# Patient Record
Sex: Female | Born: 1970 | Hispanic: Yes | Marital: Married | Smoking: Current some day smoker
Health system: Southern US, Community
[De-identification: ages and names within clinical notes are randomized; demographics above are authoritative.]

## PROBLEM LIST (undated history)

## (undated) ENCOUNTER — Inpatient Hospital Stay (HOSPITAL_COMMUNITY): Payer: Self-pay

## (undated) DIAGNOSIS — F32A Depression, unspecified: Secondary | ICD-10-CM

## (undated) DIAGNOSIS — K469 Unspecified abdominal hernia without obstruction or gangrene: Secondary | ICD-10-CM

## (undated) DIAGNOSIS — M1990 Unspecified osteoarthritis, unspecified site: Secondary | ICD-10-CM

## (undated) DIAGNOSIS — Z5189 Encounter for other specified aftercare: Secondary | ICD-10-CM

## (undated) DIAGNOSIS — N83209 Unspecified ovarian cyst, unspecified side: Secondary | ICD-10-CM

## (undated) DIAGNOSIS — F329 Major depressive disorder, single episode, unspecified: Secondary | ICD-10-CM

## (undated) DIAGNOSIS — D649 Anemia, unspecified: Secondary | ICD-10-CM

## (undated) DIAGNOSIS — F419 Anxiety disorder, unspecified: Secondary | ICD-10-CM

## (undated) HISTORY — DX: Anxiety disorder, unspecified: F41.9

## (undated) HISTORY — PX: COLONOSCOPY WITH ESOPHAGOGASTRODUODENOSCOPY (EGD): SHX5779

## (undated) HISTORY — DX: Major depressive disorder, single episode, unspecified: F32.9

## (undated) HISTORY — PX: DILATION AND CURETTAGE OF UTERUS: SHX78

## (undated) HISTORY — DX: Encounter for other specified aftercare: Z51.89

## (undated) HISTORY — DX: Anemia, unspecified: D64.9

## (undated) HISTORY — DX: Depression, unspecified: F32.A

## (undated) HISTORY — DX: Unspecified osteoarthritis, unspecified site: M19.90

---

## 2006-06-15 ENCOUNTER — Emergency Department (HOSPITAL_COMMUNITY): Admission: EM | Admit: 2006-06-15 | Discharge: 2006-06-15 | Payer: Self-pay | Admitting: *Deleted

## 2006-09-29 ENCOUNTER — Inpatient Hospital Stay (HOSPITAL_COMMUNITY): Admission: AD | Admit: 2006-09-29 | Discharge: 2006-09-29 | Payer: Self-pay | Admitting: Obstetrics & Gynecology

## 2006-09-29 ENCOUNTER — Encounter: Payer: Self-pay | Admitting: Obstetrics & Gynecology

## 2006-10-15 ENCOUNTER — Ambulatory Visit: Payer: Self-pay | Admitting: Obstetrics & Gynecology

## 2007-04-16 DIAGNOSIS — K469 Unspecified abdominal hernia without obstruction or gangrene: Secondary | ICD-10-CM

## 2007-04-16 HISTORY — DX: Unspecified abdominal hernia without obstruction or gangrene: K46.9

## 2007-09-06 ENCOUNTER — Emergency Department (HOSPITAL_COMMUNITY): Admission: EM | Admit: 2007-09-06 | Discharge: 2007-09-06 | Payer: Self-pay | Admitting: Emergency Medicine

## 2007-12-10 ENCOUNTER — Inpatient Hospital Stay (HOSPITAL_COMMUNITY): Admission: AD | Admit: 2007-12-10 | Discharge: 2007-12-10 | Payer: Self-pay | Admitting: Obstetrics & Gynecology

## 2007-12-13 ENCOUNTER — Ambulatory Visit: Payer: Self-pay | Admitting: Obstetrics and Gynecology

## 2007-12-13 ENCOUNTER — Inpatient Hospital Stay (HOSPITAL_COMMUNITY): Admission: AD | Admit: 2007-12-13 | Discharge: 2007-12-13 | Payer: Self-pay | Admitting: Obstetrics & Gynecology

## 2008-01-27 ENCOUNTER — Encounter: Payer: Self-pay | Admitting: Obstetrics and Gynecology

## 2008-01-27 ENCOUNTER — Ambulatory Visit: Payer: Self-pay | Admitting: Obstetrics & Gynecology

## 2008-01-27 ENCOUNTER — Encounter (INDEPENDENT_AMBULATORY_CARE_PROVIDER_SITE_OTHER): Payer: Self-pay | Admitting: *Deleted

## 2008-01-27 LAB — CONVERTED CEMR LAB
Antibody Screen: NEGATIVE
Basophils Absolute: 0 10*3/uL (ref 0.0–0.1)
Eosinophils Relative: 1 % (ref 0–5)
HCT: 32.2 % — ABNORMAL LOW (ref 36.0–46.0)
Hemoglobin: 10.6 g/dL — ABNORMAL LOW (ref 12.0–15.0)
Lymphocytes Relative: 18 % (ref 12–46)
Lymphs Abs: 1.7 10*3/uL (ref 0.7–4.0)
Monocytes Absolute: 0.6 10*3/uL (ref 0.1–1.0)
Monocytes Relative: 6 % (ref 3–12)
Neutro Abs: 7.2 10*3/uL (ref 1.7–7.7)
RBC: 3.5 M/uL — ABNORMAL LOW (ref 3.87–5.11)
Rh Type: POSITIVE
Rubella: 99.1 intl units/mL — ABNORMAL HIGH
WBC: 9.6 10*3/uL (ref 4.0–10.5)

## 2008-02-10 ENCOUNTER — Ambulatory Visit: Payer: Self-pay | Admitting: Obstetrics & Gynecology

## 2008-02-24 ENCOUNTER — Ambulatory Visit: Payer: Self-pay | Admitting: Obstetrics & Gynecology

## 2008-03-09 ENCOUNTER — Ambulatory Visit: Payer: Self-pay | Admitting: Obstetrics & Gynecology

## 2008-03-09 LAB — CONVERTED CEMR LAB: Chlamydia, DNA Probe: NEGATIVE

## 2008-03-23 ENCOUNTER — Ambulatory Visit: Payer: Self-pay | Admitting: Obstetrics & Gynecology

## 2008-03-30 ENCOUNTER — Inpatient Hospital Stay (HOSPITAL_COMMUNITY): Admission: AD | Admit: 2008-03-30 | Discharge: 2008-03-31 | Payer: Self-pay | Admitting: Obstetrics & Gynecology

## 2008-03-30 ENCOUNTER — Ambulatory Visit: Payer: Self-pay | Admitting: Obstetrics & Gynecology

## 2008-04-06 ENCOUNTER — Ambulatory Visit: Payer: Self-pay | Admitting: Obstetrics & Gynecology

## 2008-04-06 ENCOUNTER — Ambulatory Visit: Payer: Self-pay | Admitting: Obstetrics and Gynecology

## 2008-04-06 ENCOUNTER — Inpatient Hospital Stay (HOSPITAL_COMMUNITY): Admission: AD | Admit: 2008-04-06 | Discharge: 2008-04-08 | Payer: Self-pay | Admitting: Obstetrics and Gynecology

## 2009-10-19 ENCOUNTER — Inpatient Hospital Stay (HOSPITAL_COMMUNITY): Admission: AD | Admit: 2009-10-19 | Discharge: 2009-10-19 | Payer: Self-pay | Admitting: Obstetrics & Gynecology

## 2009-11-15 ENCOUNTER — Ambulatory Visit: Payer: Self-pay | Admitting: Obstetrics and Gynecology

## 2009-11-15 ENCOUNTER — Encounter: Payer: Self-pay | Admitting: Obstetrics and Gynecology

## 2009-11-15 LAB — CONVERTED CEMR LAB
Antibody Screen: NEGATIVE
Basophils Absolute: 0 10*3/uL (ref 0.0–0.1)
Basophils Relative: 0 % (ref 0–1)
Eosinophils Absolute: 0.1 10*3/uL (ref 0.0–0.7)
Hgb A2 Quant: 2.4 % (ref 2.2–3.2)
Hgb F Quant: 0 % (ref 0.0–2.0)
Hgb S Quant: 0 % (ref 0.0–0.0)
MCHC: 31.2 g/dL (ref 30.0–36.0)
MCV: 82.1 fL (ref 78.0–100.0)
Monocytes Relative: 4 % (ref 3–12)
Neutrophils Relative %: 77 % (ref 43–77)
RBC: 3.36 M/uL — ABNORMAL LOW (ref 3.87–5.11)
RDW: 16.3 % — ABNORMAL HIGH (ref 11.5–15.5)

## 2010-02-02 ENCOUNTER — Ambulatory Visit: Payer: Self-pay | Admitting: Family Medicine

## 2010-02-02 ENCOUNTER — Inpatient Hospital Stay (HOSPITAL_COMMUNITY)
Admission: AD | Admit: 2010-02-02 | Discharge: 2010-02-05 | Payer: Self-pay | Source: Home / Self Care | Admitting: Obstetrics & Gynecology

## 2010-02-09 ENCOUNTER — Emergency Department (HOSPITAL_COMMUNITY): Admission: EM | Admit: 2010-02-09 | Discharge: 2010-02-09 | Payer: Self-pay | Admitting: Emergency Medicine

## 2010-02-10 ENCOUNTER — Ambulatory Visit: Payer: Self-pay | Admitting: Obstetrics & Gynecology

## 2010-02-10 ENCOUNTER — Inpatient Hospital Stay (HOSPITAL_COMMUNITY): Admission: AD | Admit: 2010-02-10 | Discharge: 2010-02-14 | Payer: Self-pay | Admitting: Psychiatry

## 2010-02-10 ENCOUNTER — Ambulatory Visit: Payer: Self-pay | Admitting: Psychiatry

## 2010-02-10 ENCOUNTER — Ambulatory Visit (HOSPITAL_COMMUNITY): Admission: AD | Admit: 2010-02-10 | Discharge: 2010-02-10 | Payer: Self-pay | Admitting: Obstetrics & Gynecology

## 2010-02-21 ENCOUNTER — Ambulatory Visit: Payer: Self-pay | Admitting: Obstetrics and Gynecology

## 2010-03-21 ENCOUNTER — Ambulatory Visit: Payer: Self-pay | Admitting: Obstetrics and Gynecology

## 2010-04-19 ENCOUNTER — Emergency Department (HOSPITAL_COMMUNITY)
Admission: EM | Admit: 2010-04-19 | Discharge: 2010-04-19 | Payer: Self-pay | Source: Home / Self Care | Admitting: Emergency Medicine

## 2010-06-27 LAB — COMPREHENSIVE METABOLIC PANEL
AST: 25 U/L (ref 0–37)
Albumin: 3 g/dL — ABNORMAL LOW (ref 3.5–5.2)
Alkaline Phosphatase: 209 U/L — ABNORMAL HIGH (ref 39–117)
BUN: 15 mg/dL (ref 6–23)
BUN: 15 mg/dL (ref 6–23)
BUN: 9 mg/dL (ref 6–23)
CO2: 22 mEq/L (ref 19–32)
CO2: 23 mEq/L (ref 19–32)
Calcium: 8.7 mg/dL (ref 8.4–10.5)
Calcium: 8.9 mg/dL (ref 8.4–10.5)
Chloride: 104 mEq/L (ref 96–112)
Creatinine, Ser: 0.58 mg/dL (ref 0.4–1.2)
Creatinine, Ser: 0.59 mg/dL (ref 0.4–1.2)
GFR calc Af Amer: >60 mL/min (ref >60)
GFR calc non Af Amer: >60 mL/min (ref >60)
Glucose, Bld: 83 mg/dL (ref 70–99)
Glucose, Bld: 97 mg/dL (ref 70–99)
Potassium: 3.9 mEq/L (ref 3.5–5.1)
Total Bilirubin: 0.9 mg/dL (ref 0.3–1.2)
Total Bilirubin: 1.3 mg/dL — ABNORMAL HIGH (ref 0.3–1.2)
Total Protein: 6.4 g/dL (ref 6.0–8.3)

## 2010-06-27 LAB — POCT PREGNANCY, URINE: Preg Test, Ur: NEGATIVE

## 2010-06-27 LAB — CROSSMATCH
ABO/RH(D): O POS
Antibody Screen: NEGATIVE
Unit division: 0
Unit division: 0

## 2010-06-27 LAB — URINALYSIS, ROUTINE W REFLEX MICROSCOPIC
Bilirubin Urine: NEGATIVE
Glucose, UA: NEGATIVE mg/dL
Protein, ur: NEGATIVE mg/dL
Specific Gravity, Urine: <1.005 — ABNORMAL LOW (ref 1.005–1.030)
Urobilinogen, UA: 0.2 mg/dL (ref 0.0–1.0)

## 2010-06-27 LAB — GC/CHLAMYDIA PROBE AMP, URINE
Chlamydia, Swab/Urine, PCR: NEGATIVE
GC Probe Amp, Urine: NEGATIVE

## 2010-06-27 LAB — CBC
HCT: 29.2 % — ABNORMAL LOW (ref 36.0–46.0)
HCT: 34.9 % — ABNORMAL LOW (ref 36.0–46.0)
Hemoglobin: 11.8 g/dL — ABNORMAL LOW (ref 12.0–15.0)
Hemoglobin: 5.6 g/dL — CL (ref 12.0–15.0)
Hemoglobin: 6.8 g/dL — CL (ref 12.0–15.0)
Hemoglobin: 9.2 g/dL — ABNORMAL LOW (ref 12.0–15.0)
MCH: 24.4 pg — ABNORMAL LOW (ref 26.0–34.0)
MCHC: 31.2 g/dL (ref 30.0–36.0)
MCHC: 31.6 g/dL (ref 30.0–36.0)
MCHC: 31.7 g/dL (ref 30.0–36.0)
MCHC: 32.1 g/dL (ref 30.0–36.0)
MCHC: 32.3 g/dL (ref 30.0–36.0)
MCV: 75.4 fL — ABNORMAL LOW (ref 78.0–100.0)
MCV: 75.5 fL — ABNORMAL LOW (ref 78.0–100.0)
MCV: 75.6 fL — ABNORMAL LOW (ref 78.0–100.0)
Platelets: 180 10*3/uL (ref 150–400)
Platelets: 331 10*3/uL (ref 150–400)
RDW: 22.3 % — ABNORMAL HIGH (ref 11.5–15.5)
RDW: 23 % — ABNORMAL HIGH (ref 11.5–15.5)
RDW: 24.3 % — ABNORMAL HIGH (ref 11.5–15.5)
RDW: 25.5 % — ABNORMAL HIGH (ref 11.5–15.5)

## 2010-06-27 LAB — DIFFERENTIAL
Eosinophils Relative: 2 % (ref 0–5)
Lymphocytes Relative: 24 % (ref 12–46)
Monocytes Absolute: 0.6 10*3/uL (ref 0.1–1.0)
Monocytes Relative: 6 % (ref 3–12)
Neutrophils Relative %: 68 % (ref 43–77)

## 2010-06-27 LAB — FOLATE: Folate: 9.2 ng/mL

## 2010-06-27 LAB — URINE MICROSCOPIC-ADD ON

## 2010-06-27 LAB — SAMPLE TO BLOOD BANK

## 2010-06-27 LAB — IRON AND TIBC
Iron: 180 ug/dL — ABNORMAL HIGH (ref 42–135)
Saturation Ratios: 35 % (ref 20–55)
TIBC: 515 ug/dL — ABNORMAL HIGH (ref 250–470)

## 2010-06-27 LAB — RETICULOCYTES: Retic Count, Absolute: 90 10*3/uL (ref 19.0–186.0)

## 2010-06-27 LAB — RAPID URINE DRUG SCREEN, HOSP PERFORMED
Barbiturates: NOT DETECTED
Benzodiazepines: NOT DETECTED
Cocaine: NOT DETECTED

## 2010-06-27 LAB — VITAMIN B12: Vitamin B-12: 259 pg/mL (ref 211–911)

## 2010-06-29 LAB — POCT URINALYSIS DIPSTICK
Bilirubin Urine: NEGATIVE
Glucose, UA: NEGATIVE mg/dL
Ketones, ur: NEGATIVE mg/dL
Specific Gravity, Urine: 1.015 (ref 1.005–1.030)

## 2010-07-01 LAB — URINALYSIS, ROUTINE W REFLEX MICROSCOPIC
Glucose, UA: NEGATIVE mg/dL
Leukocytes, UA: NEGATIVE
Specific Gravity, Urine: 1.015 (ref 1.005–1.030)
pH: 6 (ref 5.0–8.0)

## 2010-07-01 LAB — URINE MICROSCOPIC-ADD ON

## 2010-07-01 LAB — GLUCOSE, CAPILLARY: Glucose-Capillary: 91 mg/dL (ref 70–99)

## 2010-08-28 NOTE — Group Therapy Note (Signed)
NAMERonda Conrad NO.:  [id number]   MEDICAL RECORD NO.:  [id number]          PATIENT TYPE:  WOC   LOCATION:  [facility]                   FACILITY:  [facility]   PHYSICIAN:  Elsie Lincoln, MD      DATE OF BIRTH:  1970/06/17   DATE OF SERVICE:                                  CLINIC NOTE   CHIEF COMPLAINT:  Two week follow up after spontaneous miscarriage.   HISTORY OF PRESENT ILLNESS:  This is a 40 year old gravida 6, para 3-0-3-  3 who initially presented to [facility] on  September 29, 2006 with a complaint of vaginal bleeding and pregnancy. She  was evaluated at the time and her quantitative beta HCG was found to be  7872. An ultrasound was obtained that confirmed complete miscarriage and  the products of conception were sent to pathology for confirmation. The  pathology results indicate immature chorionic villi present without  evidence of hydatidiform change. The patient reports that she still has  mild intermittent lower abdominal pain that is relieved by taking  Percocet. She did have some scant vaginal bleeding four days ago after  intense activity, however she has not had any other bleeding since then.  She denies any nausea, vomiting, or fevers over the past two weeks. The  patient is interested in contraception as she does not desire any  further pregnancies. She has used hormone pills, as well as Depo-Provera  in the past, and desires Depo-Provera at the current time.   PAST MEDICAL HISTORY:  The patient reports no past medical history.   PAST GYNECOLOGIC HISTORY:  The patient reports 3 normal vaginal births,  as well as 3 prior spontaneous miscarriages in total. She reports no  problems with any of her pregnancies or complications with birth. She  reports that she last had a pap smear two years ago.   PAST SURGICAL HISTORY:  The patient has no past surgeries.   CURRENT MEDICATIONS:  The patient is currently taking Percocet as  needed  for pain.   ALLERGIES:  None.   VITAL SIGNS:  Temperature 97.9, heart rate 64, blood pressure 111/65.   PHYSICAL EXAMINATION:  GENERAL:  The patient appears comfortable in no  acute distress.  ABDOMEN:  Thin, soft, mild tenderness to palpation in the suprapubic  region with no guarding, no rebound, and no masses palpated.  GENITALIA: A sterile speculum exam was performed and there is no  evidence of vaginal bleeding. The os is closed and there is no vaginal  discharge. A bimanual exam was performed to confirm that the os is  completely closed. The uterus is firm and there is no cervical motion  tenderness and no adnexal tenderness or masses.   PERTINENT LABS:  A urine pregnancy test in clinic today is negative.  Blood type is O positive.   ASSESSMENT AND PLAN:  This is a 40 year old gravida 6, para 3-0-3-3 who  is two weeks status post a complete miscarriage. Her urine pregnancy  test is negative today confirming no retained products. She does desire  contraception at this time, and she was counseled regarding  her options,  and has chosen to begin Depo-Provera 150 mg IM every 3 months. She does  not need RhoGAM as her blood type is O positive. She will return to  clinic in three months for her Depo injection and a routine pap smear.     ______________________________  Sylvan Cheese, MD    ______________________________  Elsie Lincoln, MD    MJ/MEDQ  D:  10/15/2006  T:  10/16/2006  Job:  [id number]

## 2010-11-18 ENCOUNTER — Emergency Department (HOSPITAL_COMMUNITY)
Admission: EM | Admit: 2010-11-18 | Discharge: 2010-11-18 | Disposition: A | Discharge status: Other Acute Inpatient Hospital | Payer: Self-pay | Attending: Emergency Medicine | Admitting: Emergency Medicine

## 2010-11-18 ENCOUNTER — Inpatient Hospital Stay (HOSPITAL_COMMUNITY)
Admission: RE | Admit: 2010-11-18 | Discharge: 2010-11-21 | DRG: 885 | Disposition: A | Discharge status: Home / Self Care | Payer: PRIVATE HEALTH INSURANCE | Source: Ambulatory Visit | Attending: Psychiatry | Admitting: Psychiatry

## 2010-11-18 ENCOUNTER — Emergency Department (HOSPITAL_COMMUNITY): Payer: Self-pay

## 2010-11-18 DIAGNOSIS — F333 Major depressive disorder, recurrent, severe with psychotic symptoms: Principal | ICD-10-CM

## 2010-11-18 DIAGNOSIS — S0003XA Contusion of scalp, initial encounter: Secondary | ICD-10-CM

## 2010-11-18 DIAGNOSIS — Z818 Family history of other mental and behavioral disorders: Secondary | ICD-10-CM

## 2010-11-18 DIAGNOSIS — T1490XA Injury, unspecified, initial encounter: Secondary | ICD-10-CM | POA: Insufficient documentation

## 2010-11-18 DIAGNOSIS — Z560 Unemployment, unspecified: Secondary | ICD-10-CM

## 2010-11-18 DIAGNOSIS — F3289 Other specified depressive episodes: Secondary | ICD-10-CM | POA: Insufficient documentation

## 2010-11-18 DIAGNOSIS — R079 Chest pain, unspecified: Secondary | ICD-10-CM | POA: Insufficient documentation

## 2010-11-18 DIAGNOSIS — R45851 Suicidal ideations: Secondary | ICD-10-CM

## 2010-11-18 DIAGNOSIS — F329 Major depressive disorder, single episode, unspecified: Secondary | ICD-10-CM | POA: Insufficient documentation

## 2010-11-18 DIAGNOSIS — X58XXXA Exposure to other specified factors, initial encounter: Secondary | ICD-10-CM

## 2010-11-18 DIAGNOSIS — S301XXA Contusion of abdominal wall, initial encounter: Secondary | ICD-10-CM

## 2010-11-18 LAB — POCT I-STAT, CHEM 8
BUN: 20 mg/dL (ref 6–23)
Calcium, Ion: 1.2 mmol/L (ref 1.12–1.32)
Hemoglobin: 13.9 g/dL (ref 12.0–15.0)
Sodium: 141 mEq/L (ref 135–145)
TCO2: 23 mmol/L (ref 0–100)

## 2010-11-18 LAB — RAPID URINE DRUG SCREEN, HOSP PERFORMED
Cocaine: NOT DETECTED
Opiates: NOT DETECTED
Tetrahydrocannabinol: NOT DETECTED

## 2010-11-18 LAB — URINALYSIS, ROUTINE W REFLEX MICROSCOPIC
Bilirubin Urine: NEGATIVE
Glucose, UA: NEGATIVE mg/dL
Ketones, ur: NEGATIVE mg/dL
Nitrite: NEGATIVE
Protein, ur: NEGATIVE mg/dL
pH: 6 (ref 5.0–8.0)

## 2010-11-18 LAB — DIFFERENTIAL
Basophils Absolute: 0 10*3/uL (ref 0.0–0.1)
Basophils Relative: 0 % (ref 0–1)
Eosinophils Relative: 1 % (ref 0–5)
Lymphocytes Relative: 26 % (ref 12–46)
Monocytes Absolute: 0.8 10*3/uL (ref 0.1–1.0)
Neutro Abs: 7.4 10*3/uL (ref 1.7–7.7)

## 2010-11-18 LAB — URINE MICROSCOPIC-ADD ON

## 2010-11-18 LAB — POCT PREGNANCY, URINE: Preg Test, Ur: NEGATIVE

## 2010-11-18 LAB — CBC
HCT: 36.4 % (ref 36.0–46.0)
MCHC: 33.2 g/dL (ref 30.0–36.0)
RDW: 16.9 % — ABNORMAL HIGH (ref 11.5–15.5)
WBC: 11.3 10*3/uL — ABNORMAL HIGH (ref 4.0–10.5)

## 2010-11-18 LAB — ETHANOL: Alcohol, Ethyl (B): <11 mg/dL (ref 0–11)

## 2010-11-19 DIAGNOSIS — F333 Major depressive disorder, recurrent, severe with psychotic symptoms: Secondary | ICD-10-CM
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
## 2011-01-09 LAB — DIFFERENTIAL
Basophils Absolute: 0
Basophils Relative: 0
Eosinophils Relative: 1
Monocytes Absolute: 0.5
Monocytes Relative: 4
Neutro Abs: 8.2 — ABNORMAL HIGH

## 2011-01-09 LAB — RAPID URINE DRUG SCREEN, HOSP PERFORMED
Amphetamines: NOT DETECTED
Barbiturates: NOT DETECTED
Benzodiazepines: NOT DETECTED
Tetrahydrocannabinol: NOT DETECTED

## 2011-01-09 LAB — BASIC METABOLIC PANEL
CO2: 18 — ABNORMAL LOW
Chloride: 109
GFR calc Af Amer: >60
Glucose, Bld: 74
Potassium: 3.5
Sodium: 142

## 2011-01-09 LAB — CBC
HCT: 35.2 — ABNORMAL LOW
Hemoglobin: 11.9 — ABNORMAL LOW
MCHC: 33.9
MCV: 90.9
RBC: 3.87
RDW: 16.2 — ABNORMAL HIGH

## 2011-01-14 LAB — POCT URINALYSIS DIP (DEVICE)
Glucose, UA: 100 — AB
Glucose, UA: 500 — AB
Nitrite: NEGATIVE
Nitrite: NEGATIVE
Urobilinogen, UA: 0.2
Urobilinogen, UA: 0.2
pH: 5.5
pH: 7

## 2011-01-15 LAB — POCT URINALYSIS DIP (DEVICE)
Hgb urine dipstick: NEGATIVE
Protein, ur: 30 — AB
Protein, ur: NEGATIVE
Specific Gravity, Urine: 1.01
Specific Gravity, Urine: 1.02
Urobilinogen, UA: 0.2
Urobilinogen, UA: 1
pH: 6.5

## 2011-01-16 ENCOUNTER — Emergency Department (HOSPITAL_COMMUNITY)
Admission: EM | Admit: 2011-01-16 | Discharge: 2011-01-17 | Disposition: A | Discharge status: Home / Self Care | Payer: Self-pay | Attending: Emergency Medicine | Admitting: Emergency Medicine

## 2011-01-16 ENCOUNTER — Emergency Department (HOSPITAL_COMMUNITY): Payer: Self-pay

## 2011-01-16 DIAGNOSIS — R110 Nausea: Secondary | ICD-10-CM | POA: Insufficient documentation

## 2011-01-16 DIAGNOSIS — N898 Other specified noninflammatory disorders of vagina: Secondary | ICD-10-CM | POA: Insufficient documentation

## 2011-01-16 DIAGNOSIS — R109 Unspecified abdominal pain: Secondary | ICD-10-CM | POA: Insufficient documentation

## 2011-01-16 DIAGNOSIS — R1033 Periumbilical pain: Secondary | ICD-10-CM | POA: Insufficient documentation

## 2011-01-16 DIAGNOSIS — O99891 Other specified diseases and conditions complicating pregnancy: Secondary | ICD-10-CM | POA: Insufficient documentation

## 2011-01-16 LAB — DIFFERENTIAL
Basophils Relative: 0 % (ref 0–1)
Eosinophils Absolute: 0.1 10*3/uL (ref 0.0–0.7)
Eosinophils Relative: 1 % (ref 0–5)
Monocytes Absolute: 0.7 10*3/uL (ref 0.1–1.0)
Monocytes Relative: 7 % (ref 3–12)

## 2011-01-16 LAB — URINALYSIS, ROUTINE W REFLEX MICROSCOPIC
Glucose, UA: NEGATIVE mg/dL
Ketones, ur: NEGATIVE mg/dL
Leukocytes, UA: NEGATIVE
pH: 6.5 (ref 5.0–8.0)

## 2011-01-16 LAB — BASIC METABOLIC PANEL
Calcium: 9.6 mg/dL (ref 8.4–10.5)
GFR calc non Af Amer: >90 mL/min (ref >90)
Glucose, Bld: 90 mg/dL (ref 70–99)
Sodium: 138 mEq/L (ref 135–145)

## 2011-01-16 LAB — CBC
Hemoglobin: 10.7 g/dL — ABNORMAL LOW (ref 12.0–15.0)
MCH: 29.5 pg (ref 26.0–34.0)
MCHC: 34.2 g/dL (ref 30.0–36.0)
Platelets: 248 10*3/uL (ref 150–400)

## 2011-01-16 LAB — LIPASE, BLOOD: Lipase: 39 U/L (ref 11–59)

## 2011-01-16 LAB — WET PREP, GENITAL: Yeast Wet Prep HPF POC: NONE SEEN

## 2011-01-18 LAB — POCT URINALYSIS DIP (DEVICE)
Bilirubin Urine: NEGATIVE
Ketones, ur: NEGATIVE mg/dL
Nitrite: NEGATIVE
Protein, ur: 30 mg/dL — AB
Protein, ur: 30 mg/dL — AB
Specific Gravity, Urine: 1.02 (ref 1.005–1.030)
Urobilinogen, UA: 0.2 mg/dL (ref 0.0–1.0)
pH: 7 (ref 5.0–8.0)
pH: 7 (ref 5.0–8.0)
pH: 7 (ref 5.0–8.0)

## 2011-01-18 LAB — CBC
MCHC: 33.3 g/dL (ref 30.0–36.0)
MCV: 90.6 fL (ref 78.0–100.0)
Platelets: 228 10*3/uL (ref 150–400)
RDW: 16.2 % — ABNORMAL HIGH (ref 11.5–15.5)
WBC: 13.5 10*3/uL — ABNORMAL HIGH (ref 4.0–10.5)

## 2011-01-18 LAB — RPR: RPR Ser Ql: NONREACTIVE

## 2011-01-30 LAB — URINALYSIS, ROUTINE W REFLEX MICROSCOPIC
Bilirubin Urine: NEGATIVE
Glucose, UA: NEGATIVE
Ketones, ur: NEGATIVE
Protein, ur: NEGATIVE
pH: 5.5

## 2011-01-30 LAB — HCG, QUANTITATIVE, PREGNANCY: hCG, Beta Chain, Quant, S: 7872 — ABNORMAL HIGH

## 2011-01-30 LAB — CBC
MCHC: 34
MCV: 94.1
RBC: 3.78 — ABNORMAL LOW
RDW: 14.6 — ABNORMAL HIGH

## 2011-01-30 LAB — URINE MICROSCOPIC-ADD ON

## 2011-02-11 ENCOUNTER — Telehealth: Payer: Self-pay | Admitting: Obstetrics and Gynecology

## 2011-02-11 NOTE — Telephone Encounter (Signed)
Per [name] (counselor from [facility]), patient is c/o hernia above her belly button with increasing pain. Advised that we don't have any appointments earlier than her set appt on 03/06/11 so she will have to go to MAU especially with symptoms of increasing pain. Bonnie Conrad agreed and will relay message to patient.

## 2011-03-06 ENCOUNTER — Ambulatory Visit (INDEPENDENT_AMBULATORY_CARE_PROVIDER_SITE_OTHER): Payer: PRIVATE HEALTH INSURANCE | Admitting: Obstetrics and Gynecology

## 2011-03-06 ENCOUNTER — Encounter: Payer: Self-pay | Admitting: Obstetrics and Gynecology

## 2011-03-06 ENCOUNTER — Other Ambulatory Visit: Payer: Self-pay | Admitting: Obstetrics and Gynecology

## 2011-03-06 DIAGNOSIS — O99019 Anemia complicating pregnancy, unspecified trimester: Secondary | ICD-10-CM

## 2011-03-06 DIAGNOSIS — Z3689 Encounter for other specified antenatal screening: Secondary | ICD-10-CM

## 2011-03-06 DIAGNOSIS — O0940 Supervision of pregnancy with grand multiparity, unspecified trimester: Secondary | ICD-10-CM

## 2011-03-06 DIAGNOSIS — Z23 Encounter for immunization: Secondary | ICD-10-CM

## 2011-03-06 DIAGNOSIS — O0990 Supervision of high risk pregnancy, unspecified, unspecified trimester: Secondary | ICD-10-CM

## 2011-03-06 DIAGNOSIS — O0942 Supervision of pregnancy with grand multiparity, second trimester: Secondary | ICD-10-CM | POA: Insufficient documentation

## 2011-03-06 DIAGNOSIS — N898 Other specified noninflammatory disorders of vagina: Secondary | ICD-10-CM

## 2011-03-06 DIAGNOSIS — O09529 Supervision of elderly multigravida, unspecified trimester: Secondary | ICD-10-CM

## 2011-03-06 DIAGNOSIS — O2240 Hemorrhoids in pregnancy, unspecified trimester: Secondary | ICD-10-CM | POA: Insufficient documentation

## 2011-03-06 DIAGNOSIS — F323 Major depressive disorder, single episode, severe with psychotic features: Secondary | ICD-10-CM | POA: Insufficient documentation

## 2011-03-06 DIAGNOSIS — K469 Unspecified abdominal hernia without obstruction or gangrene: Secondary | ICD-10-CM

## 2011-03-06 DIAGNOSIS — R319 Hematuria, unspecified: Secondary | ICD-10-CM | POA: Insufficient documentation

## 2011-03-06 LAB — POCT URINALYSIS DIP (DEVICE)
Bilirubin Urine: NEGATIVE
Nitrite: NEGATIVE
pH: 6 (ref 5.0–8.0)

## 2011-03-06 LAB — WET PREP, GENITAL
Clue Cells Wet Prep HPF POC: NONE SEEN
Trich, Wet Prep: NONE SEEN

## 2011-03-06 LAB — HIV ANTIBODY (ROUTINE TESTING W REFLEX): HIV: NONREACTIVE

## 2011-03-06 MED ORDER — INFLUENZA VIRUS VACC SPLIT PF IM SUSP: 0.5000 mL | Freq: Once | INTRAMUSCULAR | Status: DC

## 2011-03-06 NOTE — Progress Notes (Signed)
Addended by: Sherre Lain A on: 03/06/2011 11:08 AM   Modules accepted: Orders

## 2011-03-06 NOTE — Progress Notes (Signed)
Addended by: Lynnell Dike on: 03/06/2011 10:57 AM   Modules accepted: Orders

## 2011-03-06 NOTE — Progress Notes (Signed)
Pain: pelvic Pressure: none C/O of rectal bleed just this morning C/O of abdominal hernia

## 2011-03-06 NOTE — Progress Notes (Signed)
Subjective:    Bonnie Conrad is a [id number] [redacted]w[redacted]d being seen today for her first obstetrical visit.  Her obstetrical history is significant for advanced maternal age and see [name] Patient does intend to breast feed. Pregnancy history fully reviewed.  Patient reports backache, nausea, no contractions, no leaking and no vaginal bleed. Good FM. Has red blood in stool and hemorrhoids. . Denies depressive sx or suicidal ideation though self d/c'd Abilify and Zoloft 2 months ago and was inpt for suicidal ideation 1 yr ago. Has large abd hernia. Denies pain. Ceasar Mons Vitals:   03/06/11 0800 03/06/11 0807 03/06/11 0809  BP: 98/53  88/60  Pulse:  86   Temp: 98 F (36.7 C)    Height:  4' 9.25" (1.454 m)   Weight: 115 lb 1.6 oz (52.209 kg)      HISTORY: OB History    Grav Para Term Preterm Abortions TAB SAB Ect Mult Living   9 5 5  0 3 0 3 0 0 6     # Outc Date GA Lbr Len/2nd Wgt Sex Del Anes PTL Lv   1 SAB 1/92 100w0d          2 TRM 1/95 [redacted]w[redacted]d  6lb(2.722kg) M SVD   Yes   3 TRM 12/96 [redacted]w[redacted]d  6lb(2.722kg) F SVD   Yes   4 SAB 1/01 [redacted]w[redacted]d          5 TRM 3/03 [redacted]w[redacted]d  7lb(3.175kg) F SVD   Yes   6 SAB 1/08 [redacted]w[redacted]d          7 TRM 12/09 [redacted]w[redacted]d  7lb(3.175kg)  SVD      8 TRM 10/11 [redacted]w[redacted]d  8lb(3.629kg) M SVD   Yes   9 CUR              Past Medical History  Diagnosis Date  . Depression     no treatment   Past Surgical History  Procedure Date  . No past surgeries    History reviewed. No pertinent family history.   Exam    Uterine Size: 17 cm  Pelvic Exam:    Perineum: Hemorrhoids soft, 1-2 cm ext hemorrhoids   Vulva: normal   Vagina:  normal mucosa, wet prep done, yellowish d/c.   pH: Not done   Cervix: friable with Pap   Adnexa: not evaluated   Bony Pelvis: average and gynecoid  System: Breast:  normal appearance, no masses or tenderness   Skin: normal coloration and turgor, no rashes    Neurologic: oriented, normal, normal mood, oriented   Extremities: No edema           Neck  supple   Cardiovascular: regular rate and rhythm   Respiratory:  CTA bil   Abdomen: 12 cm supraumbilical hernia, soft NT   Urinary: urethral meatus normal      Assessment:    Pregnancy: [id number] Patient Active Problem List  Diagnoses  . Maternal age 40+, multigravida  . Severe major depression with psychotic features  . Supervision of pregnancy with grand multiparity in second trimester  . Hematuria  . Hemorrhoids in pregnancy  . Hernia of abdominal cavity  . Anemia of mother in pregnancy        Plan:     1 hr glucola, Pap, GC/CT, WP, urine C&S Rx: Anusol HC, Phenergan, iron 1 po bid Prenatal vitamins.: Has supply but not taking d/t nausea. Rx Phenergan Problem list reviewed and updated. Genetic Screening discussed Quad Screen: counseled and desires today  Ultrasound discussed; fetal survey: ordered. [facility]  Follow up in 2 weeks.   Bonnie Conrad 03/06/2011

## 2011-03-07 LAB — OBSTETRIC PANEL
Hepatitis B Surface Ag: NEGATIVE
Lymphocytes Relative: 18 % (ref 12–46)
Lymphs Abs: 1.5 10*3/uL (ref 0.7–4.0)
MCV: 89.2 fL (ref 78.0–100.0)
Neutrophils Relative %: 77 % (ref 43–77)
Platelets: 252 10*3/uL (ref 150–400)
RBC: 3.51 MIL/uL — ABNORMAL LOW (ref 3.87–5.11)
Rubella: 106.6 IU/mL — ABNORMAL HIGH
WBC: 8.2 10*3/uL (ref 4.0–10.5)

## 2011-03-08 LAB — CULTURE, OB URINE

## 2011-03-11 LAB — HEMOGLOBINOPATHY EVALUATION: Hemoglobin Other: 0 %

## 2011-03-12 ENCOUNTER — Telehealth: Payer: Self-pay | Admitting: *Deleted

## 2011-03-12 DIAGNOSIS — O0990 Supervision of high risk pregnancy, unspecified, unspecified trimester: Secondary | ICD-10-CM

## 2011-03-12 NOTE — Telephone Encounter (Signed)
Needs order for detailed [place]

## 2011-03-12 NOTE — Progress Notes (Signed)
Addended by: Faythe Casa on: 03/12/2011 11:08 AM   Modules accepted: Orders

## 2011-03-13 ENCOUNTER — Ambulatory Visit (HOSPITAL_COMMUNITY)
Admission: RE | Admit: 2011-03-13 | Discharge: 2011-03-13 | Disposition: A | Discharge status: Home / Self Care | Payer: PRIVATE HEALTH INSURANCE | Source: Ambulatory Visit | Attending: Obstetrics and Gynecology | Admitting: Obstetrics and Gynecology

## 2011-03-13 DIAGNOSIS — O09529 Supervision of elderly multigravida, unspecified trimester: Secondary | ICD-10-CM | POA: Insufficient documentation

## 2011-03-13 DIAGNOSIS — O358XX0 Maternal care for other (suspected) fetal abnormality and damage, not applicable or unspecified: Secondary | ICD-10-CM | POA: Insufficient documentation

## 2011-03-13 DIAGNOSIS — Z1389 Encounter for screening for other disorder: Secondary | ICD-10-CM | POA: Insufficient documentation

## 2011-03-13 DIAGNOSIS — O0990 Supervision of high risk pregnancy, unspecified, unspecified trimester: Secondary | ICD-10-CM

## 2011-03-13 DIAGNOSIS — Z363 Encounter for antenatal screening for malformations: Secondary | ICD-10-CM | POA: Insufficient documentation

## 2011-03-20 ENCOUNTER — Ambulatory Visit (INDEPENDENT_AMBULATORY_CARE_PROVIDER_SITE_OTHER): Payer: PRIVATE HEALTH INSURANCE | Admitting: Family Medicine

## 2011-03-20 DIAGNOSIS — O0942 Supervision of pregnancy with grand multiparity, second trimester: Secondary | ICD-10-CM

## 2011-03-20 DIAGNOSIS — O99019 Anemia complicating pregnancy, unspecified trimester: Secondary | ICD-10-CM

## 2011-03-20 DIAGNOSIS — R319 Hematuria, unspecified: Secondary | ICD-10-CM

## 2011-03-20 DIAGNOSIS — F323 Major depressive disorder, single episode, severe with psychotic features: Secondary | ICD-10-CM

## 2011-03-20 DIAGNOSIS — O0940 Supervision of pregnancy with grand multiparity, unspecified trimester: Secondary | ICD-10-CM

## 2011-03-20 LAB — POCT URINALYSIS DIP (DEVICE)
Leukocytes, UA: NEGATIVE
Nitrite: NEGATIVE
Protein, ur: NEGATIVE mg/dL
pH: 6 (ref 5.0–8.0)

## 2011-03-20 MED ORDER — PRENATAL VIT-FE FUMARATE-FA 29-1 MG PO CHEW: 1.0000 | CHEWABLE_TABLET | Freq: Every day | ORAL | Status: DC
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
## 2011-03-20 NOTE — Progress Notes (Signed)
Patient seen and examined with Mosetta Putt, PA-S.  Agree with her note.  She will follow up with psychiatry at [facility].  Patient denies current suicide ideality.  She was instructed to go to MAU or ED if having suicidal thoughts.

## 2011-03-20 NOTE — Progress Notes (Signed)
SW initially called by Clinic staff reporting that patient is suicidal today.  SW paged ACT Team and informed Clinic staff that patient will most likely have to be sent to MAU to be seen by ACT Team since she is actively suicidal.  Clinic staff clarified with patient, who denies SI at this time and states the last time she felt suicidal was a week ago.  SW asked Clinic staff if an antidepressant would be prescribed and that SW will meet with patient to arrange outpatient counseling/follow up.  Clinic staff states that patient is already followed by the [facility] and has an appointment scheduled.  Therefore, SW will not see patient at this time unless she requests to speak to SW.

## 2011-03-20 NOTE — Patient Instructions (Signed)
Depresin, en el adolescente y en el adulto (Depression, Adolescent and Adult) La depresin es una enfermedad cierta y tratable. Hay dos tipos de depresin:  La depresin que todos experimentamos en alguna forma. Por ejemplo, la muerte de un ser querido, problemas econmicos o Marketing executive naturales pueden originar o aumentar un estado depresivo.   Por otra parte, la depresin clnica, aparece sin una causa o motivo aparente. En [employer], la depresin es una enfermedad. Puede estar causada por un desequilibrio de los mensajeros qumicos del organismo y del cerebro, o puede ser Neomia Dear respuesta a una enfermedad fsica. El alcohol y otras drogas pueden causar depresin.  DIAGNSTICO El diagnstico de depresin se basa generalmente en los sntomas y la historia clnica. TRATAMIENTO Generalmente el tratamiento para la depresin se [place] en tres categoras. Estos son:  Conservation officer, historic buildings. Existen muchos medicamentos para tratar la depresin. Las respuestas pueden variar y muchas veces es necesario actuar segn el modo de ensayo y error para Chief Strategy Officer cul es el mejor medicamento y la mejor dosis para Sales executive.   La psicoterapia, tambin denominada [place], ayuda a las personas a Oncologist sus problemas vindolos desde un diferente punto de vista y [employer] comprendan segn su experiencia personal. La psicoterapia tradicional busca el origen del problema en la niez. Otros tipos de psicoterapia se basan en el conflicto actual y buscan la forma de [place]. Si el motivo de la depresin es el consumo de drogas, hay tratamientos disponibles que ayudan a abstenerse del consumo de sustancias qumicas. Generalmente, luego de un tiempo, la depresin [place]. Si hay causas subyacentes que favorecen el uso de sustancias qumicas, debern ser evaluadas.   La terapia electroconvulsiva o tratamiento de shock no se utiliza actualmente. Sin embargo, es un tratamiento muy efectivo para los  casos de depresin grave en los que exista peligro de suicidio. Durante la terapia electroconvulsiva se aplican impulsos elctricos en la cabeza. Estos impulsos ocasionan una convulsin generalizada.Puede ser Lincoln Brigham, pero ocasiona prdida de la memoria de hechos recientes. Algunas veces, esta prdida de memoria puede incluir los ltimos meses.  Hay que [employer] de depresin o [place] de suicidio como una situacin grave. Pida ayuda a Administrator, sports. No espere a ver si la depresin grave mejora con el tiempo sin ayuda. Busque ayuda para usted mismo o para aquellos que lo rodean. En los [employer] nmeros de la National Suicide Help Lines (Lnea nacional de ayuda al suicida) que brindan [place] 24 horas son: 1-800-SUICIDE (954)584-4960 Document Released: 04/01/2005 Document Revised: 12/12/2010 [facility] Patient Information 2012 [place], [place].

## 2011-03-31 ENCOUNTER — Observation Stay (HOSPITAL_COMMUNITY)
Admission: AD | Admit: 2011-03-31 | Discharge: 2011-04-02 | Disposition: A | Discharge status: Other Acute Inpatient Hospital | Payer: Medicaid Other | Source: Ambulatory Visit | Attending: Family Medicine | Admitting: Family Medicine

## 2011-03-31 DIAGNOSIS — R45851 Suicidal ideations: Secondary | ICD-10-CM | POA: Insufficient documentation

## 2011-03-31 DIAGNOSIS — O0942 Supervision of pregnancy with grand multiparity, second trimester: Secondary | ICD-10-CM

## 2011-03-31 DIAGNOSIS — F410 Panic disorder [episodic paroxysmal anxiety] without agoraphobia: Secondary | ICD-10-CM

## 2011-03-31 DIAGNOSIS — R109 Unspecified abdominal pain: Secondary | ICD-10-CM | POA: Insufficient documentation

## 2011-03-31 DIAGNOSIS — O99340 Other mental disorders complicating pregnancy, unspecified trimester: Principal | ICD-10-CM | POA: Insufficient documentation

## 2011-03-31 DIAGNOSIS — F323 Major depressive disorder, single episode, severe with psychotic features: Secondary | ICD-10-CM | POA: Diagnosis present

## 2011-03-31 HISTORY — DX: Unspecified abdominal hernia without obstruction or gangrene: K46.9

## 2011-04-01 ENCOUNTER — Encounter (HOSPITAL_COMMUNITY): Payer: Self-pay | Admitting: *Deleted

## 2011-04-01 DIAGNOSIS — F323 Major depressive disorder, single episode, severe with psychotic features: Secondary | ICD-10-CM

## 2011-04-01 DIAGNOSIS — O99340 Other mental disorders complicating pregnancy, unspecified trimester: Secondary | ICD-10-CM

## 2011-04-01 DIAGNOSIS — R45851 Suicidal ideations: Secondary | ICD-10-CM

## 2011-04-01 DIAGNOSIS — R109 Unspecified abdominal pain: Secondary | ICD-10-CM

## 2011-04-01 MED ORDER — ACETAMINOPHEN 325 MG PO TABS
650.0000 mg | ORAL_TABLET | ORAL | Status: DC | PRN
  Administered 2011-04-01 – 2011-04-02 (×2): 650 mg via ORAL
  Filled 2011-04-01 (×2): qty 2

## 2011-04-01 MED ORDER — COMPLETENATE 29-1 MG PO CHEW
1.0000 | CHEWABLE_TABLET | Freq: Every day | ORAL | Status: DC
  Administered 2011-04-01: 1 via ORAL
  Filled 2011-04-01 (×3): qty 1

## 2011-04-01 MED ORDER — CALCIUM CARBONATE ANTACID 500 MG PO CHEW
2.0000 | CHEWABLE_TABLET | ORAL | Status: DC | PRN
  Filled 2011-04-01: qty 2

## 2011-04-01 MED ORDER — LORAZEPAM 2 MG/ML IJ SOLN
2.0000 mg | Freq: Once | INTRAMUSCULAR | Status: AC
  Administered 2011-04-01: 2 mg via INTRAMUSCULAR
  Filled 2011-04-01: qty 1

## 2011-04-01 MED ORDER — LORAZEPAM 2 MG/ML IJ SOLN
1.0000 mg | INTRAMUSCULAR | Status: DC | PRN
  Administered 2011-04-01: 1 mg via INTRAMUSCULAR
  Filled 2011-04-01: qty 1

## 2011-04-01 NOTE — Progress Notes (Signed)
SW received call from [employer] asking what the plan for pt is and whether or not she needs to have a psych consult since she was already seen by the ACT Team.  SW spoke to [place] [facility] RN who states she will discuss pt's case with the doctors at [facility] and get back to SW.  She also states that we should continue waiting on psych consult.  SW informed Lanette and third floor staff.

## 2011-04-01 NOTE — Progress Notes (Signed)
UR chart review completed.  

## 2011-04-01 NOTE — Progress Notes (Signed)
SW received call back from [facility] RN stating that they cannot determine which level of care pt will need without the psych consult from the psychiatrist and therefore stated that they will not be able to accept her until this is completed.  She state that he will be there for the consult tonight or early in the morning.  SW informed beside Charity fundraiser.
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
## 2011-04-01 NOTE — Progress Notes (Signed)
FACULTY PRACTICE ANTEPARTUM(COMPREHENSIVE) NOTE  Bonnie Conrad is a 40 y.o. [id number] at [redacted]w[redacted]d by LMP who is admitted for Suicidal Ideations hold; awaiting bed at [facility].  Sitter at beside  Length of Stay:  1  Days  Subjective: Reports "a little" cramping in lower abd and dizziness/sleepiness from medication Patient reports the fetal movement as active. Patient reports uterine contraction  activity as none. Patient reports  vaginal bleeding as none. Patient describes fluid per vagina as None.  Vitals:  Blood pressure 121/62, pulse 85, temperature 98.5 F (36.9 C), temperature source Oral, resp. rate 16, last menstrual period 10/23/2010, SpO2 98.00%, unknown if currently breastfeeding. Physical Examination:  General appearance - alert, well appearing, and in no distress, oriented to person, place, and time and normal appearing weight Mental status - alert, oriented to person, place, and time, drowsy Abdomen - gravid, non tender Neurological - alert, oriented, normal speech, no focal findings or movement disorder noted, DTR's normal and symmetric Extremities - peripheral pulses normal, no pedal edema, no clubbing or cyanosis, Homan's sign negative bilaterally  Fetal Monitoring:  dopplered 150s   Medications:  Scheduled  I have reviewed the patient's current medications.  ASSESSMENT: Guarded status Patient Active Problem List  Diagnoses  . Maternal age 81+, multigravida  . Severe major depression with psychotic features    PLAN: Continue inpatient hold with sitter at [facility] until bed available at [facility] Anticipate transfer around noon today.  Chandelle Harkey E. 04/01/2011,7:49 AM

## 2011-04-01 NOTE — ED Notes (Signed)
Pt relaxing and becoming calmer.  Act team member at the bedside.

## 2011-04-01 NOTE — ED Notes (Signed)
ACT team paged.  
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
## 2011-04-01 NOTE — ED Provider Notes (Signed)
I have seen/examined this patient and agree with the above assessment and plan. Pt calms immediately with the topic of 38 yo and 38 yo daughters. Respirations and HR decreased considerably with conversation focused on 38 yo and 70 yo daughters. IM Ativan was administered with pt permission and pt visibly relaxed within 5 mins of injection, HR and respiration WNL. Pt states she does not want to close her eyes because she is "scared". ACT team arrived and assumed care of pt mental states.
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
## 2011-04-01 NOTE — H&P (Signed)
Chart reviewed and agree with management and plan.  

## 2011-04-01 NOTE — ED Notes (Signed)
S.Shores and interpreter at the bedside to discuss plan of care.

## 2011-04-02 ENCOUNTER — Inpatient Hospital Stay (HOSPITAL_COMMUNITY)
Admission: AD | Admit: 2011-04-02 | Discharge: 2011-04-05 | DRG: 781 | Disposition: A | Discharge status: Home / Self Care | Payer: Medicaid Other | Source: Ambulatory Visit | Attending: Psychiatry | Admitting: Psychiatry

## 2011-04-02 ENCOUNTER — Encounter (HOSPITAL_COMMUNITY): Payer: Self-pay | Admitting: *Deleted

## 2011-04-02 DIAGNOSIS — R45851 Suicidal ideations: Secondary | ICD-10-CM

## 2011-04-02 DIAGNOSIS — F410 Panic disorder [episodic paroxysmal anxiety] without agoraphobia: Secondary | ICD-10-CM

## 2011-04-02 DIAGNOSIS — F333 Major depressive disorder, recurrent, severe with psychotic symptoms: Secondary | ICD-10-CM | POA: Diagnosis present

## 2011-04-02 DIAGNOSIS — O99340 Other mental disorders complicating pregnancy, unspecified trimester: Principal | ICD-10-CM

## 2011-04-02 DIAGNOSIS — F4310 Post-traumatic stress disorder, unspecified: Secondary | ICD-10-CM

## 2011-04-02 DIAGNOSIS — O0942 Supervision of pregnancy with grand multiparity, second trimester: Secondary | ICD-10-CM

## 2011-04-02 MED ORDER — GLYCERIN (LAXATIVE) 2.1 G RE SUPP
1.0000 | Freq: Every day | RECTAL | Status: DC | PRN
  Filled 2011-04-02: qty 1

## 2011-04-02 MED ORDER — LORAZEPAM 1 MG PO TABS
1.0000 mg | ORAL_TABLET | ORAL | Status: DC | PRN
  Administered 2011-04-02: 1 mg via ORAL
  Filled 2011-04-02: qty 1

## 2011-04-02 MED ORDER — OLANZAPINE 2.5 MG PO TABS: 2.5000 mg | ORAL_TABLET | Freq: Four times a day (QID) | ORAL | Status: DC | PRN

## 2011-04-02 MED ORDER — SERTRALINE HCL 50 MG PO TABS
150.0000 mg | ORAL_TABLET | Freq: Every day | ORAL | Status: DC
  Administered 2011-04-02: 150 mg via ORAL
  Filled 2011-04-02 (×2): qty 1

## 2011-04-02 MED ORDER — ACETAMINOPHEN 325 MG PO TABS: 650.0000 mg | ORAL_TABLET | ORAL | Status: DC | PRN

## 2011-04-02 MED ORDER — SERTRALINE HCL 50 MG PO TABS
50.0000 mg | ORAL_TABLET | Freq: Every day | ORAL | Status: DC
  Administered 2011-04-03: 50 mg via ORAL
  Filled 2011-04-02 (×2): qty 1

## 2011-04-02 MED ORDER — CALCIUM CARBONATE ANTACID 500 MG PO CHEW
2.0000 | CHEWABLE_TABLET | ORAL | Status: DC | PRN
  Filled 2011-04-02: qty 2

## 2011-04-02 MED ORDER — PRENATAL MULTIVITAMIN CH
1.0000 | ORAL_TABLET | Freq: Every day | ORAL | Status: DC
  Administered 2011-04-03 – 2011-04-05 (×3): 1 via ORAL
  Filled 2011-04-02 (×4): qty 1

## 2011-04-02 MED ORDER — HYDROXYZINE PAMOATE 25 MG PO CAPS: 25.0000 mg | ORAL_CAPSULE | Freq: Every evening | ORAL | Status: DC | PRN

## 2011-04-02 NOTE — Progress Notes (Signed)
  Patient reported having increased anxiety and related abdominal pain  because she is worried about her children.  FHR reassuring in the 140s, benign abdominal exam.  Denies contractions, vaginal bleeding or leaking of fluid.  Reports good fetal movement.  Reassured her that they are being taken care of by her family members, and the focus is to ensure she gets the treatment she needs.  Discussed options of medication vs nonpharmacologic treatment of major depressive disorder with psychotic symptoms. Risks of PPHN and other antipsychotic side effects discussed with patient; all questions answered.  Patient verbalized that she was willing to undergo pharmacologic treatment.  Will contact [facility] for re-consideration of this patient for admission.  As per Dr. Alvie Heidelberg recommendations; will start Zoloft 150 mg by mouth daily and Ativan as needed.  Will continue in-house observation and on-on-one sitter for suicide precautions.   Jaynie Collins, M.D. 04/02/2011 2:45 PM

## 2011-04-02 NOTE — Progress Notes (Signed)
SW spoke with Dahlia Client N./LCSW who works closely with Dr. Ahluwalia/psychiatrist to request that pt be seen today.  She states that he will be over this afternoon.

## 2011-04-02 NOTE — Progress Notes (Signed)
The interpreter came to the floor to talk with patient, and see if she had any needs. Stated she had no needs. House coverage, and security were also present to see if patient had any needs at this time.

## 2011-04-02 NOTE — Progress Notes (Signed)
Report given to KIM, RN at [facility]. Report called and given to [facility]. [facility] arrived to transport pt at 2120.

## 2011-04-02 NOTE — Progress Notes (Signed)
Patient ID: Bonnie Conrad, female   DOB: 1970/05/07, 40 y.o.   MRN: [id number]  S: No complaints.  Denies contractions, vaginal bleeding or leaking of fluid.  Reports good fetal movement.  Underwent evaluation by Dr. Rogers Blocker, the [facility] Psychiatrist today.    O:Blood pressure 93/54, pulse 91, temperature 98.3 F (36.8 C), temperature source Oral, resp. rate 18, height 4\' 9"  (1.448 m), weight 52.929 kg (116 lb 11 oz), last menstrual period 10/23/2010, SpO2 95.00%, Gen: NAD Abd: Soft, NT, ND Ext: No c/c/e, NT  A/P: 40 y.o. [id number] at [redacted]w[redacted]d admitted for major depressive disorder with psychotic symptoms and noncompliance with medications. In discussion with Dr. Rogers Blocker and Dr. Lucianne Muss at [facility], given that patient is noncompliant with medications, the best therapy for her would be ECT.  This cannot be provided at [facility]; [facility] unit is full.  Will discuss options of medication again with patient; if she is still declining medical treatment, will continue to look for facilities that can provide this service for pregnant patients.  Will continue inpatient observation and sitter services for now.  Jaynie Collins, M.D. 04/02/2011 2:09 PM

## 2011-04-02 NOTE — Discharge Summary (Signed)
Physician Discharge Summary  Patient ID: Bonnie Conrad MRN: [id number] DOB/AGE: 1970/08/01 40 y.o.  Admit date: 03/31/2011 Discharge date: 04/02/2011  Admission Diagnoses: Intrauterine pregnancy at [redacted]w[redacted]d, suicidal ideation, major depression with psychotic features  Discharge Diagnoses: The same Active Problems:  Severe major depression with psychotic features  Discharged Condition: Good  Hospital Course:  Patient presented to [facility] MAU with symptoms concerning for major depression with psychotic features.  She also expressed suicidal ideation and endorsed hearing voices telling her to harm her fetus and her children.  Given lack of beds at [facility] at that moment, she was admitted to [facility] and observed with the help of a 24 hour sitter. There were no events during her admission. She was treated with Ativan as needed, and did not get evaluated by a psychaitrist until her evaluation with Dr. Rogers Blocker today.  Patient is willing to undergo pharmacologic treatment so she meets criteria for transfer to [facility].  This was explained to the patient.  Consults: Psychiatry  Treatments: Zoloft 150 mg po daily, Ativan 1 mg po q4h as needed  Discharge Exam: Blood pressure 93/54, pulse 91, temperature 98.3 F (36.8 C), temperature source Oral, resp. rate 18, height 4\' 9"  (1.448 m), weight 52.929 kg (116 lb 11 oz), last menstrual period 10/23/2010, SpO2 95.00%, unknown if currently breastfeeding. General appearance: alert and no distress Resp: clear to auscultation bilaterally Cardio: regular rate and rhythm GI: soft, non-tender; bowel sounds normal; no masses,  no organomegaly Pelvic: deferred Extremities: extremities normal, atraumatic, no cyanosis or edema  Disposition: Transfer to [facility]  Discharge Orders    Future Appointments: Provider: Department: Dept Phone: Center:   04/17/2011 8:15 AM [facility] [phone] [facility]      Medication List  As of 04/02/2011  4:34 PM   CONTINUE taking these medications         Prenatal Vit-Fe Fumarate-FA 29-1 MG Chew   Chew 1 tablet by mouth daily.             SignedJaynie Collins A 04/02/2011, 4:34 PM

## 2011-04-02 NOTE — Progress Notes (Signed)
UR Chart review completed.  

## 2011-04-02 NOTE — Progress Notes (Signed)
Dr. Daralene Milch states pt needs inpatient psych treatment.  [facility] has denied her and Dr. Rogers Blocker has asked that pt goes to [facility].  SW has contacted [facility], and they do not have any beds at this time.  SW has spoken with staff at [facility] who state they also do not have any available beds, but have agreed to staff the case for possible admission when a bed becomes available.  SW will also try availability at [facility] and [facility].
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
## 2011-04-02 NOTE — Progress Notes (Signed)
[facility] Department of Clinical Social Work Documentation of Interpretation   I assisted __Dr Anyanwu_________________ with interpretation of ___explanation of care plan___________________ for this patient.

## 2011-04-03 DIAGNOSIS — F329 Major depressive disorder, single episode, unspecified: Secondary | ICD-10-CM

## 2011-04-03 DIAGNOSIS — F333 Major depressive disorder, recurrent, severe with psychotic symptoms: Secondary | ICD-10-CM | POA: Diagnosis present

## 2011-04-03 LAB — COMPREHENSIVE METABOLIC PANEL
ALT: 8 U/L (ref 0–35)
AST: 15 U/L (ref 0–37)
CO2: 23 mEq/L (ref 19–32)
Chloride: 102 mEq/L (ref 96–112)
GFR calc Af Amer: >90 mL/min (ref >90)
GFR calc non Af Amer: >90 mL/min (ref >90)
Glucose, Bld: 107 mg/dL — ABNORMAL HIGH (ref 70–99)
Sodium: 134 mEq/L — ABNORMAL LOW (ref 135–145)
Total Bilirubin: 0.3 mg/dL (ref 0.3–1.2)

## 2011-04-03 MED ORDER — SERTRALINE HCL 100 MG PO TABS
100.0000 mg | ORAL_TABLET | Freq: Every day | ORAL | Status: DC
  Administered 2011-04-04 – 2011-04-05 (×2): 100 mg via ORAL
  Filled 2011-04-03 (×3): qty 1

## 2011-04-03 MED ORDER — OLANZAPINE 5 MG PO TABS
5.0000 mg | ORAL_TABLET | Freq: Every day | ORAL | Status: DC
  Administered 2011-04-03 – 2011-04-04 (×2): 5 mg via ORAL
  Filled 2011-04-03 (×3): qty 1

## 2011-04-03 MED ORDER — LORAZEPAM 1 MG PO TABS: 1.0000 mg | ORAL_TABLET | ORAL | Status: DC | PRN

## 2011-04-03 MED ORDER — PRENATAL MULTIVITAMIN CH
1.0000 | ORAL_TABLET | Freq: Every day | ORAL | Status: DC
  Filled 2011-04-03: qty 1
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
## 2011-04-03 NOTE — Progress Notes (Signed)
Recreation Therapy Group Note  Date: 04/03/2011         Time: 0930      Group Topic/Focus: The focus of this group is on enhancing patients' problem solving skills, which involves identifying the problem, brainstorming solutions and choosing and trying a solution.   Participation Level: Minimal  Participation Quality: Attentive  Affect: Blunted  Cognitive: Oriented   Additional Comments: Patient participated to the best of her ability, but it was difficult without an interpreter present. RN reports interpreter has been called.   Anacristina Steffek 04/03/2011 10:08 AM
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
## 2011-04-03 NOTE — Tx Team (Addendum)
Interdisciplinary Treatment Plan Update (Adult)  Date:  04/03/2011  Time Reviewed:  10:02 AM   Progress in Treatment: Attending groups:  Yes - needs interpreter in order to participate Participating in groups:    No, interpreter was not available until after groups this AM; will likely start participating now Taking medication as prescribed:    New Tolerating medication:   New Family/Significant other contact made:  No - still needed Patient understands diagnosis:   Yes Discussing patient identified problems/goals with staff:   Yes, through interpreter Medical problems stabilized or resolved:   Yes, remains pregnant Denies suicidal/homicidal ideation:  Yes Issues/concerns per patient self-inventory:   None Other:  New problem(s) identified: Yes, Describe:  Is not sleeping at all  Reason for Continuation of Hospitalization: Depression Hallucinations Medication stabilization Suicidal ideation  Interventions implemented related to continuation of hospitalization:  Medication monitoring and adjustment, safety checks Q15 min., group therapy, psychoeducation, collateral contact  Additional comments:  Is going to [facility] for Ob/Gyn care, states he is okay with the medications she is on.  It seems everything is good until she gets angry, and that triggers SI, depression.  Estimated length of stay:   3-5 days  Discharge Plan:  Return to home with father of children, children, follow up at [facility]):  Sleep pattern will return to normal, 5-6+ hours nightly  Review of initial/current patient goals per problem list:   1.  Goal(s):  Reduce auditory hallucinations, any other psychotic symptoms to baseline  Met:  No  Target date:  By Discharge   As evidenced by:  Although denies hearing at immediate moment, still fears ongoing voices telling her to cut baby out of stomach  2.  Goal(s):  Deny suicidal ideation and verbalize safety plan  Met:  Yes  Target date:  By  Discharge   As evidenced by:  States no SI today  3.  Goal(s):  Reduce depression from 10 to 3.  Met:  Yes  Target date:  By Discharge   As evidenced by:"I feel good, I feel tranquil, no depression today."  0 hopelessness  4.  Goal(s):  Assess for CPS report, reinstatement of home services to help with stress with children.  Met:  No  Target date:  By Discharge   As evidenced by:  Ongoing need, she does state that things are much more stressful now that this service has terminated.  Does not remember who the service was through, but it was recommended that the family participate in the program.  Treatment Team decided CPS report is needed.  Attendees: Patient:  Bonnie Conrad  04/03/2011 10:58 AM   Family:     Physician:  Dr. Harvie Heck Readling 04/03/2011 10:58 AM   Nursing:   Fransisca Kaufmann, RN 04/03/2011   10:58 AM   Case Manager:  Ambrose Mantle, LCSW 04/03/2011  10:58 AM   Counselor:  Veto Kemps, MT-BC 04/03/2011  10:58 AM   Other:   Interpreter 04/03/2011 10:58 AM   Other:   Gretta Arab, RN 04/03/2011 11:11 AM   Other:      Other:       Scribe for Treatment Team:   Sarina Ser, 04/03/2011, 11:00 AM

## 2011-04-03 NOTE — Tx Team (Signed)
Initial Interdisciplinary Treatment Plan  PATIENT STRENGTHS: (choose at least two) Ability for insight Average or above average intelligence Motivation for treatment/growth Physical Health Supportive family/friends  PATIENT STRESSORS: Health problems Marital or family conflict   PROBLEM LIST: Problem List/Patient Goals Date to be addressed Date deferred Reason deferred Estimated date of resolution  Help with depression 04/03/11           [redacted] wks pregnant 04/03/11                                          DISCHARGE CRITERIA:  Ability to meet basic life and health needs Adequate post-discharge living arrangements Improved stabilization in mood, thinking, and/or behavior Medical problems require only outpatient monitoring Motivation to continue treatment in a less acute level of care Need for constant or close observation no longer present Reduction of life-threatening or endangering symptoms to within safe limits Safe-care adequate arrangements made Verbal commitment to aftercare and medication compliance  PRELIMINARY DISCHARGE PLAN: Attend aftercare/continuing care group Outpatient therapy Participate in family therapy  PATIENT/FAMIILY INVOLVEMENT: This treatment plan has been presented to and reviewed with the patient, Bonnie Conrad, and/or family member.  The patient and family have been given the opportunity to ask questions and make suggestions.  Bonnie Conrad 04/03/2011, 12:31 AM

## 2011-04-03 NOTE — Progress Notes (Signed)
Suicide Risk Assessment  Admission Assessment     Demographic factors:  Assessment Details Time of Assessment: Admission Information Obtained From: Patient Current Mental Status:  Current Mental Status:  (none at adm) Loss Factors:  Loss Factors:  (none) Historical Factors:  Historical Factors: Family history of mental illness or substance abuse Risk Reduction Factors:  Risk Reduction Factors: Pregnancy;Responsible for children under 38 years of age;Sense of responsibility to family;Positive coping skills or problem solving skills  CLINICAL FACTORS:   Schizophrenia:   Command hallucinatons Medical Diagnoses and Treatments/Surgeries  COGNITIVE FEATURES THAT CONTRIBUTE TO RISK:  No Known cognitive impairment.   Diagnosis:  Axis I:  Major Depressive Disorder - Recurrent with Psychotic Features.  The patient was seen today and reports the following:   Sleep: The patient reports to having difficulty initiating and maintaining sleep. Appetite: The patient reports a good appetite.   Mild>(1-10) >Severe  Hopelessness (1-10): 0  Depression (1-10): 5 (The patient denied feeling depressed but appears moderately depressed.)  Anxiety (1-10): 0   Suicidal Ideation: The patient denies any current suicidal ideations today.   Plan: No  Intent: No  Means: No   Homicidal Ideation: The patient denies any homicidal ideations today.  Plan: No  Intent: No.  Means: No   Eye Contact: Good.  General Appearance Bonnie Conrad: Neat and Clean today. Motor Behavior: Appropriate.  Speech: Appropriate in rate and volume.  Mental Status: AO x 3 today.  Level of Consciousness: Alert and oriented x 3..  Mood: Moderately depressed.  Affect: Moderately Constricted.  Anxiety: None reported or observed.  Thought Process: WNL.  Thought Content: No current auditory or visual hallucinations or delusional thinking reported. The patient does report to episodically hearing voices telling her to "cut the baby out  of her stomach to terminate the pregnancy."  Perception: WNL.  Judgment: Fair.  Insight: Fair.  Cognition: Orientation time, place and person.   Treatment Plan Summary:  1. Daily contact with patient to assess and evaluate symptoms and progress in treatment  2. Medication management  3. The patient will deny suicidal ideations or homicidal ideations for 48 hours prior to discharge and have a depression and anxiety rating of 3 or less. The patient will also deny any auditory or visual hallucinations or delusional thinking or display any manic or hypomanic behaviors.   Plan:  1. Will continue Zoloft at 100 mgs po q am for depression. 2. Will add Zyprexa 5 mgs po qhs for sleep and for psychotic symptoms.  3. Continue to monitor.   SUICIDE RISK:  Moderate: Frequent suicidal ideation with limited intensity, and duration, some specificity in terms of plans, no associated intent, good self-control, limited dysphoria/symptomatology, some risk factors present, and identifiable protective factors, including available and accessible social support.   Bonnie Conrad 04/03/2011, 12:29 PM
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
## 2011-04-03 NOTE — BH Assessment (Signed)
Pt denies SI/HI/AVH through the use of an interpreter, although she states that she does hear voices at times. Pt states that she is not depressed or hopeless. Pt states that she does not have anxiety. Pt states that she did not sleep well but she did eat well. Pt states that she feels 'ok'.

## 2011-04-04 NOTE — Progress Notes (Signed)
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   The patient was seen today and reports the following:   Sleep: The patient reports to sleeping much better last night and feels rested this morning.  Appetite: The patient reports a good appetite.   Mild>(1-10) >Severe  Hopelessness (1-10): 0  Depression (1-10): 0 Anxiety (1-10): 0   Suicidal Ideation: The patient adamantly denies any current suicidal ideations today.  Plan: No  Intent: No  Means: No   Homicidal Ideation: The patient adamantly denies any homicidal ideations today.  Plan: No  Intent: No.  Means: No   Eye Contact: Good.  General Appearance Bonnie Conrad: Neat and Clean today.  Motor Behavior: Appropriate.  Speech: Appropriate in rate and volume.  Mental Status: AO x 3 today.  Level of Consciousness: Alert and oriented x 3..  Mood: Euthymic today.  Affect: Bright and Full.  Anxiety: None reported or observed.  Thought Process: WNL.  Thought Content: No current auditory or visual hallucinations or delusional thinking reported.  Perception: WNL.  Judgment: Good.  Insight: Good.  Cognition: Orientation time, place and person.  Sleep:  Number of Hours: 4.25   Vital Signs:Blood pressure 91/53, pulse 132, temperature 98.2 F (36.8 C), temperature source Oral, resp. rate 17, last menstrual period 10/23/2010, unknown if currently breastfeeding.  Lab Results:  Results for orders placed during the hospital encounter of 04/02/11 (from the past 48 hour(s))  COMPREHENSIVE METABOLIC PANEL     Status: Abnormal   Collection Time   04/03/11  8:11 PM      Component Value Range Comment   Sodium 134 (*) 135 - 145 (mEq/L)    Potassium 3.6  3.5 - 5.1 (mEq/L)    Chloride 102  96 - 112 (mEq/L)    CO2 23  19 - 32 (mEq/L)    Glucose, Bld 107 (*) 70 - 99 (mg/dL)    BUN 8  6 - 23 (mg/dL)    Creatinine, Ser 5.62  0.50 - 1.10 (mg/dL)    Calcium 9.4   8.4 - 10.5 (mg/dL)    Total Protein 7.0  6.0 - 8.3 (g/dL)    Albumin 3.1 (*) 3.5 - 5.2 (g/dL)    AST 15  0 - 37 (U/L)    ALT 8  0 - 35 (U/L)    Alkaline Phosphatase 72  39 - 117 (U/L)    Total Bilirubin 0.3  0.3 - 1.2 (mg/dL)    GFR calc non Af Amer >90  >90 (mL/min)    GFR calc Af Amer >90  >90 (mL/min)     The patient reports today that she is feeling much better with no depression reported.  She states that she feels much calmer and feels ready to go home.  The patient states that she and her boyfriend have resolved their issues and he has promised to be more supportive in the future. She also agreed to return to [facility] in the event that any hallucinations return.  Treatment Plan Summary:  1. Daily contact with patient to assess and evaluate symptoms and progress in treatment  2. Medication management  3. The patient will deny suicidal ideations or homicidal ideations for 48 hours prior to discharge and have a depression and anxiety rating of 3 or less. The patient will also deny any auditory or visual hallucinations or delusional thinking or display any manic or hypomanic behaviors.   Plan:  1. Will  continue Zoloft at 100 mgs po q am for depression.  2. Will continue Zyprexa 5 mgs po qhs for sleep and for psychotic symptoms.  3. Continue to monitor.  4. Possible discharge tomorrow.   Adrien Dietzman 04/04/2011, 11:49 AM

## 2011-04-04 NOTE — Progress Notes (Signed)
Patient ID: Bonnie Conrad, female   DOB: 28-May-1970, 40 y.o.   MRN: [id number]  Pt was pleasant and cooperative. The interpretor was present during group. Stated she had a "good day because she got a visit from her boyfriend." Clinical research associate offered herself to the pt and asked if she had any concerns or questions.  Support and encouragement was offered.

## 2011-04-04 NOTE — Progress Notes (Signed)
Interpreter was in this PM . Pt. did attend karioki with the interpreter & enjoyed it Pt. denies any pain. Boyfriend visited also, earlier. Pt. Has been up in dayroom The patient denies SI,HI, & AVH.Continues on 15 minute checks. Pt. Safety maintained.

## 2011-04-04 NOTE — Progress Notes (Signed)
[facility] Group Notes:  (Counselor/Nursing/MHT/Case Management/Adjunct)  04/04/2011 4:26 PM  Type of Therapy:  Music Therapy  Participation Level:  Active  Participation Quality:  Attentive and Sharing  Affect:  Appropriate  Cognitive:  Oriented  Insight:  Limited  Engagement in Group:  Good  Engagement in Therapy:  Good  Modes of Intervention:  Socialization, Support and Music  Summary of Progress/Problems: Patient joined her peers in music game identifying christmas songs. She was singing and smiling as she participated. No drowsiness in the afternoon.   HartisAram Beecham 04/04/2011, 4:26 PM

## 2011-04-04 NOTE — Progress Notes (Signed)
[facility] Group Notes:  (Counselor/Nursing/MHT/Case Management/Adjunct)  04/04/2011 4:24 PM  Type of Therapy:  Group Therapy  Participation Level:  Minimal  Participation Quality:  Drowsy  Affect:  Depressed  Cognitive:  Oriented  Insight:  Limited  Engagement in Group:  Limited  Engagement in Therapy:  Limited  Modes of Intervention:  Education and Support  Summary of Progress/Problems: Patient was drowsy and interpreter had to keep waking her up during group. She stated that she is feeling better and not hearing voices.   Veto Kemps 04/04/2011, 4:24 PM

## 2011-04-04 NOTE — BH Assessment (Signed)
Pt denies SI/HI/AVH. Pt reports being in a better mood because her boyfriend came to see her. Pt is appropriate. Pt states that if she does have SI/HI/AVH in the future she knows to seek help. Pt states that she slept well and her appetite is good. Pt rates her depression and hopelessness both as 1.

## 2011-04-05 DIAGNOSIS — F333 Major depressive disorder, recurrent, severe with psychotic symptoms: Secondary | ICD-10-CM

## 2011-04-05 MED ORDER — OLANZAPINE 5 MG PO TABS: 5.0000 mg | ORAL_TABLET | Freq: Every day | ORAL | Status: DC

## 2011-04-05 MED ORDER — SERTRALINE HCL 100 MG PO TABS: 100.0000 mg | ORAL_TABLET | Freq: Every day | ORAL | Status: DC

## 2011-04-05 MED ORDER — CALCIUM CARBONATE ANTACID 500 MG PO CHEW: 2.0000 | CHEWABLE_TABLET | ORAL | Status: DC | PRN

## 2011-04-05 NOTE — Tx Team (Signed)
Interdisciplinary Treatment Plan Update (Adult)  Date:  04/05/2011 Time Reviewed:  11:56 AM  Progress in Treatment: Attending groups: Yes. Participating in groups:  Yes. Taking medication as prescribed:  Yes. Tolerating medication:  Yes. Family/Significant othe contact made:  Yes, individual(s) contacted:  cousin byphone and talked to boyfriend about suicide prevention Patient understands diagnosis:  Yes. Discussing patient identified problems/goals with staff:  Yes. Medical problems stabilized or resolved:  Yes. Denies suicidal/homicidal ideation: Yes. Issues/concerns per patient self-inventory:  No. Other:  New problem(s) identified: None  Reason for Continuation of Hospitalization: Other; describe d/c today  Interventions implemented related to continuation of hospitalization:  Additional comments:  Estimated length of stay: D/C today  Discharge Plan: Return home with boyfriend. Follow with [facility]  New goal(s):  Review of initial/current patient goals per problem list:     Attendees: Patient:  Bonnie Conrad 12/21/201211:56 AM  Family:   12/21/201211:56 AM  Physician:  Franchot Gallo, MD 12/21/201211:56 AM  Nursing:   Fransisca Kaufmann, RN 12/21/201211:56 AM  Case Manager:  Jeannette How, LCSW 12/21/201211:56 AM  Counselor:  Veto Kemps, MT-BC 12/21/201211:56 AM  Other:   12/21/201211:56 AM  Other:   12/21/201211:56 AM  Other:   12/21/201211:56 AM  Other:   12/21/201211:56 AM   Scribe for Treatment Team:   Jeannette How, 04/05/2011, 11:56 AM

## 2011-04-05 NOTE — Discharge Summary (Signed)
Physician Discharge Summary  Patient ID: Bonnie Conrad MRN: [id number] DOB/AGE: 11-04-1970 40 y.o.  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   Demographic factors:  Assessment Details Time of Assessment: Admission Information Obtained From: Patient Current Mental Status:  Current Mental Status:  (none at adm) Risk Reduction Factors:  Risk Reduction Factors: Pregnancy;Responsible for children under 27 years of age;Sense of responsibility to family;Positive coping skills or problem solving skills  CLINICAL FACTORS:   Depression:   Anhedonia  COGNITIVE FEATURES THAT CONTRIBUTE TO RISK:  No Cognitive Impairment.  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   The patient was seen today and reports the following:   Sleep: The patient continues to sleep well last night without difficulty.  Appetite: The patient reports a good appetite.   Mild>(1-10) >Severe  Hopelessness (1-10): 0  Depression (1-10): 0  Anxiety (1-10): 0   Suicidal Ideation: The patient adamantly denies any current suicidal ideations today.  Plan: No  Intent: No  Means: No   Homicidal Ideation: The patient adamantly denies any homicidal ideations today.  Plan: No  Intent: No.  Means: No   Eye Contact: Good.  General Appearance Bonnie Conrad: Neat and Clean today.  Motor Behavior: Appropriate.  Speech: Appropriate in rate and volume.  Mental Status: AO x 3 today.  Level of Consciousness: Alert and oriented x 3..  Mood: Euthymic today.  Affect: Bright and Full.  Anxiety: None reported or observed.  Thought Process: WNL.  Thought Content: No current auditory or visual hallucinations or delusional thinking reported.  Perception: WNL.  Judgment: Good.  Insight:  Good.  Cognition: Orientation time, place and person.   Treatment Plan Summary:  1. Daily contact with patient to assess and evaluate symptoms and progress in treatment  2. Medication management  3. The patient will deny suicidal ideations or homicidal ideations for 48 hours prior to discharge and have a depression and anxiety rating of 3 or less. The patient will also deny any auditory or visual hallucinations or delusional thinking or display any manic or hypomanic behaviors.   Plan:  1. Will continue Zoloft at 100 mgs po q am for depression.  2. Will continue Zyprexa 5 mgs po qhs for sleep and for psychotic symptoms.  3. Continue to monitor.  4. Discharge today.  Hospital Course: The patient was admitted to [facility] on 04/02/2011 for treatment of depression as well as a recent history of having thoughts of cutting out her unborn child.  The patient states that this occurred approximately 2 weeks prior to admission after an argument with her boyfriend.  The patient was placed on Zyprexa 5 mgs po qhs for her auditory hallucinations and continued on Zoloft 100 mgs po qam for depression.  She responded well to the medications and is being discharged today to outpatient follow-up.   Discharged Condition: The patient reports to sleeping well and denied any symptoms of depression, anxiety or hopelessness.  She also denied any SI/HI as well as any auditory or visual hallucinations or delusional thinking.  She agreed to keep all follow-up appointments and agreed to return to [facility] or to [facility] if she experiences any return of her hallucinations or any thoughts of hurting herself or other.  Consults: None.  Discharge Exam: Blood pressure 88/53, pulse 93, temperature 98.4 F (36.9 C),  temperature source Oral, resp. rate 16, last menstrual period 10/23/2010, unknown if currently breastfeeding.  Disposition: Home  Discharge Orders    Future Appointments: Provider: Department: Dept Phone: Center:    04/17/2011 8:15 AM [facility] [phone] [facility]     Future Orders Please Complete By Expires   Diet - low sodium heart healthy      Increase activity slowly      Discharge instructions      Comments:   Take your medication samples with you.  Keep all medications safe and out of reach of children and others. Please keep your follow-up appointments.     Medication List  As of 04/05/2011 12:24 PM   START taking these medications         calcium carbonate 500 MG chewable tablet   Commonly known as: TUMS - dosed in mg elemental calcium   Chew 2 tablets (400 mg of elemental calcium total) by mouth every 4 (four) hours as needed. For acid indigestion      OLANZapine 5 MG tablet   Commonly known as: ZYPREXA   Take 1 tablet (5 mg total) by mouth at bedtime. For clear thoughts        sertraline 100 MG tablet   Commonly known as: ZOLOFT   Take 1 tablet (100 mg total) by mouth daily. For depression           CONTINUE taking these medications         Prenatal Vit-Fe Fumarate-FA 29-1 MG Chew   Chew 1 tablet by mouth daily.          Where to get your medications    These are the prescriptions that you need to pick up.   You may get these medications from any pharmacy.         OLANZapine 5 MG tablet   sertraline 100 MG tablet         Information on where to get these meds is not yet available. Ask your nurse or doctor.         calcium carbonate 500 MG chewable tablet           Follow-up Information    Follow up with [facility] on 04/12/2011. (8:15 AM)    Contact information:   [address]  [place], [place] [address]  [phone]        SUICIDE RISK:   Minimal: No identifiable suicidal ideation.  Patients presenting with no risk factors but with morbid ruminations; may be classified as minimal risk based on the severity of the depressive symptoms   Signed: Franchot Gallo 04/05/2011, 12:24 PM

## 2011-04-05 NOTE — Progress Notes (Signed)
Patient ID: Bonnie Conrad, female   DOB: [birth year], 40 y.o.   MRN: [id number]  Pt was discharged home with her family, her husband and a interpretor was present at discharge. Pt was given her discharge instructions, no questions or concerns were noted. Pt reported being negative SI/HI, no AH/VH noted. Pt was given samples to take with her, no other issues or concerns noted.

## 2011-04-05 NOTE — Progress Notes (Signed)
Resting quietly in bed with eyes closed.  No physical or behavioral problems reported or observed.  Safety maintained via Q 15 min safety checks.

## 2011-04-05 NOTE — Progress Notes (Signed)
[facility] Family/Significant Other Suicide Prevention Education  Suicide Prevention Education:  Education Completed; Ward Givens (Boyfriend) has been identified by the patient as the family member/significant other with whom the patient will be residing, and identified as the person(s) who will aid the patient in the event of a mental health crisis (suicidal ideations/suicide attempt).  With written consent from the patient, the family member/significant other has been provided the following suicide prevention education, prior to the and/or following the discharge of the patient.  The suicide prevention education provided includes the following:  Suicide risk factors  Suicide prevention and interventions  National Suicide Hotline telephone number  [facility] assessment telephone number  [facility] Emergency Assistance 911  [facility] and/or Residential Mobile Crisis Unit telephone number  Request made of family/significant other to:  Remove weapons (e.g., guns, rifles, knives), all items previously/currently identified as safety concern.    Remove drugs/medications (over-the-counter, prescriptions, illicit drugs), all items previously/currently identified as a safety concern.  The family member/significant other verbalizes understanding of the suicide prevention education information provided.  The family member/significant other agrees to remove the items of safety concern listed above. Met with boyfriend before discharge.He wanted to know how to know if she was taking her medications. Encouraged him to monitor this and to give her medications before he left for the day, even if that meant patient getting up, eating, taking her meds and then going back to bed. They were agreeable to this. Also told them to look for signs of irritability and other symptoms that she exhibited before admission.  Bonnie Conrad 04/05/2011, 12:25 PM

## 2011-04-05 NOTE — Progress Notes (Signed)
Suicide Risk Assessment  Discharge Assessment     Demographic factors:  Assessment Details Time of Assessment: Admission Information Obtained From: Patient Current Mental Status:  Current Mental Status:  (none at adm) Risk Reduction Factors:  Risk Reduction Factors: Pregnancy;Responsible for children under 38 years of age;Sense of responsibility to family;Positive coping skills or problem solving skills  CLINICAL FACTORS:   Depression:   Anhedonia  COGNITIVE FEATURES THAT CONTRIBUTE TO RISK:  No Cognitive Impairment.  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   The patient was seen today and reports the following:   Sleep: The patient continues to sleep well last night without difficulty.  Appetite: The patient reports a good appetite.   Mild>(1-10) >Severe  Hopelessness (1-10): 0  Depression (1-10): 0  Anxiety (1-10): 0   Suicidal Ideation: The patient adamantly denies any current suicidal ideations today.  Plan: No  Intent: No  Means: No   Homicidal Ideation: The patient adamantly denies any homicidal ideations today.  Plan: No  Intent: No.  Means: No   Eye Contact: Good.  General Appearance Luretha Murphy: Neat and Clean today.  Motor Behavior: Appropriate.  Speech: Appropriate in rate and volume.  Mental Status: AO x 3 today.  Level of Consciousness: Alert and oriented x 3..  Mood: Euthymic today.  Affect: Bright and Full.  Anxiety: None reported or observed.  Thought Process: WNL.  Thought Content: No current auditory or visual hallucinations or delusional thinking reported.  Perception: WNL.  Judgment: Good.  Insight: Good.  Cognition: Orientation time, place and person.   Treatment Plan Summary:  1. Daily contact with patient to assess and evaluate symptoms and progress in treatment  2. Medication management  3. The patient will deny suicidal ideations or homicidal ideations for 48 hours prior to discharge and have a  depression and anxiety rating of 3 or less. The patient will also deny any auditory or visual hallucinations or delusional thinking or display any manic or hypomanic behaviors.   Plan:  1. Will continue Zoloft at 100 mgs po q am for depression.  2. Will continue Zyprexa 5 mgs po qhs for sleep and for psychotic symptoms.  3. Continue to monitor.  4. Discharge today.  SUICIDE RISK:   Minimal: No identifiable suicidal ideation.  Patients presenting with no risk factors but with morbid ruminations; may be classified as minimal risk based on the severity of the depressive symptoms   Fielding Mault 04/05/2011, 12:20 PM

## 2011-04-08 NOTE — Progress Notes (Signed)
Patient Discharge Instructions:  Admission Note Faxed,  12/24 Discharge Note Faxed,   12/24 After Visit Summary Faxed,  12/24 Faxed to the Next Level Care provider:  Dimple Casey, Barbaraann Share, 04/08/2011, 2:09 PM

## 2011-04-09 ENCOUNTER — Encounter (HOSPITAL_COMMUNITY): Payer: Self-pay | Admitting: *Deleted

## 2011-04-09 ENCOUNTER — Observation Stay (HOSPITAL_COMMUNITY)
Admission: AD | Admit: 2011-04-09 | Discharge: 2011-04-10 | Disposition: A | Discharge status: Home / Self Care | Payer: Medicaid Other | Source: Ambulatory Visit | Attending: Obstetrics & Gynecology | Admitting: Obstetrics & Gynecology

## 2011-04-09 DIAGNOSIS — R109 Unspecified abdominal pain: Secondary | ICD-10-CM | POA: Insufficient documentation

## 2011-04-09 DIAGNOSIS — K649 Unspecified hemorrhoids: Secondary | ICD-10-CM | POA: Insufficient documentation

## 2011-04-09 DIAGNOSIS — O0942 Supervision of pregnancy with grand multiparity, second trimester: Secondary | ICD-10-CM

## 2011-04-09 DIAGNOSIS — O26899 Other specified pregnancy related conditions, unspecified trimester: Secondary | ICD-10-CM

## 2011-04-09 DIAGNOSIS — O228X9 Other venous complications in pregnancy, unspecified trimester: Secondary | ICD-10-CM

## 2011-04-09 HISTORY — DX: Unspecified abdominal hernia without obstruction or gangrene: K46.9

## 2011-04-09 MED ORDER — HYDROMORPHONE HCL PF 1 MG/ML IJ SOLN
1.0000 mg | INTRAMUSCULAR | Status: DC | PRN
  Administered 2011-04-09: 1 mg via INTRAVENOUS
  Administered 2011-04-09: 2 mg via INTRAVENOUS
  Filled 2011-04-09: qty 1
  Filled 2011-04-09: qty 2

## 2011-04-09 MED ORDER — DOCUSATE SODIUM 100 MG PO CAPS
100.0000 mg | ORAL_CAPSULE | Freq: Every day | ORAL | Status: DC
  Administered 2011-04-09 – 2011-04-10 (×2): 100 mg via ORAL
  Filled 2011-04-09 (×2): qty 1

## 2011-04-09 MED ORDER — ACETAMINOPHEN 325 MG PO TABS
650.0000 mg | ORAL_TABLET | ORAL | Status: DC | PRN
  Administered 2011-04-09: 650 mg via ORAL
  Filled 2011-04-09: qty 2

## 2011-04-09 MED ORDER — HYDROMORPHONE HCL PF 1 MG/ML IJ SOLN
0.5000 mg | Freq: Once | INTRAMUSCULAR | Status: AC
  Administered 2011-04-09: 0.5 mg via INTRAVENOUS
  Filled 2011-04-09: qty 1

## 2011-04-09 MED ORDER — SERTRALINE HCL 100 MG PO TABS
100.0000 mg | ORAL_TABLET | Freq: Every day | ORAL | Status: DC
  Administered 2011-04-09 – 2011-04-10 (×2): 100 mg via ORAL
  Filled 2011-04-09 (×3): qty 1

## 2011-04-09 MED ORDER — HYDROCORTISONE ACE-PRAMOXINE 1-1 % RE FOAM
1.0000 | Freq: Once | RECTAL | Status: AC
  Administered 2011-04-09: 1 via RECTAL
  Filled 2011-04-09: qty 10

## 2011-04-09 MED ORDER — COMPLETENATE 29-1 MG PO CHEW
1.0000 | CHEWABLE_TABLET | Freq: Every day | ORAL | Status: DC
  Administered 2011-04-09 – 2011-04-10 (×2): 1 via ORAL
  Filled 2011-04-09 (×3): qty 1

## 2011-04-09 MED ORDER — LACTATED RINGERS IV SOLN
INTRAVENOUS | Status: DC
  Administered 2011-04-09 (×3): via INTRAVENOUS

## 2011-04-09 MED ORDER — PRENATAL VIT-FE FUMARATE-FA 29-1 MG PO CHEW: 1.0000 | CHEWABLE_TABLET | Freq: Every day | ORAL | Status: DC

## 2011-04-09 MED ORDER — ZOLPIDEM TARTRATE 10 MG PO TABS: 10.0000 mg | ORAL_TABLET | Freq: Every evening | ORAL | Status: DC | PRN

## 2011-04-09 MED ORDER — CALCIUM CARBONATE ANTACID 500 MG PO CHEW
2.0000 | CHEWABLE_TABLET | ORAL | Status: DC | PRN
  Administered 2011-04-09: 400 mg via ORAL
  Filled 2011-04-09: qty 2

## 2011-04-09 MED ORDER — OLANZAPINE 5 MG PO TABS
5.0000 mg | ORAL_TABLET | Freq: Every day | ORAL | Status: DC
  Administered 2011-04-09: 5 mg via ORAL
  Filled 2011-04-09 (×2): qty 1

## 2011-04-09 MED ORDER — LACTATED RINGERS IV SOLN: INTRAVENOUS | Status: DC

## 2011-04-09 NOTE — ED Notes (Signed)
Dr Shawnie Pons calling radiologist to confirm and make arrangements.

## 2011-04-09 NOTE — Progress Notes (Signed)
Pt requesting pain med for swollen, inflammed  hemorrohoids.

## 2011-04-09 NOTE — ED Notes (Signed)
Monitor off, strip reviewed by D Izora Ribas RN.  Pt up to BR.

## 2011-04-09 NOTE — Progress Notes (Signed)
Pt visiting with family and was on her feet a lot and notice pain in her left side and at the site of her hernia.  Pink spotting started tonight.

## 2011-04-09 NOTE — ED Notes (Signed)
Bedside ultrasound performed to verify fetal heart rate. Orders to D/C monitors per [facility].

## 2011-04-09 NOTE — ED Provider Notes (Signed)
History     Chief Complaint  Patient presents with  . Abdominal Pain   HPI Bonnie Conrad 40 y.o. female  [id number] at [redacted]w[redacted]d presenting with hemorrhoid and abdominal pain.   Patient states she was on her feet doing a lot of hourse work today. This evening she went to have a bowel movement and had to strain a great deal. She began to have 9/10 pain around her anus (history of hemorrhoids) and also began to have 8/10 pain in hers upper abdomen near the sight of her 7cm hernia.   Patient denies contractions/decreased fetal movement/discharge/blood from vagina/rush of fluid.    OB History    Grav Para Term Preterm Abortions TAB SAB Ect Mult Living   9 5 5  0 3 0 3 0 0 5      Past Medical History  Diagnosis Date  . Depression     no treatment  . Abdominal hernia   . Hernia of abdominal cavity 2009    Past Surgical History  Procedure Date  . No past surgeries     Family History  Problem Relation Age of Onset  . Hypertension Mother     History  Substance Use Topics  . Smoking status: Never Smoker   . Smokeless tobacco: Not on file  . Alcohol Use: No    Allergies: No Known Allergies  Prescriptions prior to admission  Medication Sig Dispense Refill  . Prenatal Vit-Fe Fumarate-FA 29-1 MG CHEW Chew 1 tablet by mouth daily.  90 each  3  . PRESCRIPTION MEDICATION Take 1 tablet by mouth at bedtime as needed. Pt states she received samples of a medication to help her sleep from a clinic.  She does not know the RX name or strength given to her.       . sertraline (ZOLOFT) 100 MG tablet Take 1 tablet (100 mg total) by mouth daily. For depression   30 tablet  0    ROS negative except as noted in HPI   Physical Exam   Blood pressure 110/48, pulse 110, temperature 97.2 F (36.2 C), temperature source Oral, resp. rate 22, last menstrual period 10/23/2010, unknown if currently breastfeeding.  Physical Exam  Vitals reviewed. Constitutional: She is oriented to person, place,  and time. She appears well-developed and well-nourished. She appears distressed.  Cardiovascular: Regular rhythm and normal heart sounds.  Exam reveals no gallop and no friction rub.   No murmur heard. Respiratory: Breath sounds normal. No respiratory distress. She has no wheezes. She has no rales.  GI: Soft. Bowel sounds are normal. She exhibits mass (bulging 7 cm upper abdominal hernia. Very tender to touch even with distraction. Reducible. ). She exhibits no distension.  Musculoskeletal: Normal range of motion. She exhibits no edema.  Neurological: She is alert and oriented to person, place, and time.    Cervix closed-exam by Sid Falcon   MAU Course  Procedures  MDM Will get MRI of abdomen due to level of pain Dilaudid for pain Proctofoam for hemorrhoid pain  S/p dilaudid and proctofoam, patient is more comfortable, currently pending transport to [facility] for MRI.  Assessment and Plan  #1 [id number] at [redacted]w[redacted]d #2 Abdominal Pain  Pending further workup with MRI. Pending transport to [facility]. Does not appear that patient will be transferred before day shift arrives so will transfer care to day team at 8 am   HUNTER, STEPHEN 04/09/2011, 3:30 AM

## 2011-04-09 NOTE — ED Notes (Signed)
Spoke with someone at [facility], "unaware of need for MRI, get the pt here and we will get it done"

## 2011-04-09 NOTE — H&P (Signed)
  History     Chief Complaint  Patient presents with  . Abdominal Pain   HPI Bonnie Conrad 40 y.o. female  [id number] at [redacted]w[redacted]d presenting with hemorrhoid and abdominal pain.   Patient states she was on her feet doing a lot of hourse work today. This evening she went to have a bowel movement and had to strain a great deal. She began to have 9/10 pain around her anus (history of hemorrhoids) and also began to have 8/10 pain in hers upper abdomen near the sight of her 7cm hernia.   Patient denies contractions/decreased fetal movement/discharge/blood from vagina/rush of fluid.    OB History    Grav Para Term Preterm Abortions TAB SAB Ect Mult Living   9 5 5  0 3 0 3 0 0 5      Past Medical History  Diagnosis Date  . Depression     no treatment  . Abdominal hernia   . Hernia of abdominal cavity 2009    Past Surgical History  Procedure Date  . No past surgeries     Family History  Problem Relation Age of Onset  . Hypertension Mother     History  Substance Use Topics  . Smoking status: Never Smoker   . Smokeless tobacco: Not on file  . Alcohol Use: No    Allergies: No Known Allergies  Prescriptions prior to admission  Medication Sig Dispense Refill  . Prenatal Vit-Fe Fumarate-FA 29-1 MG CHEW Chew 1 tablet by mouth daily.  90 each  3  . PRESCRIPTION MEDICATION Take 1 tablet by mouth at bedtime as needed. Pt states she received samples of a medication to help her sleep from a clinic.  She does not know the RX name or strength given to her.       . sertraline (ZOLOFT) 100 MG tablet Take 1 tablet (100 mg total) by mouth daily. For depression   30 tablet  0    ROS negative except as noted in HPI   Physical Exam   Blood pressure 110/48, pulse 110, temperature 97.2 F (36.2 C), temperature source Oral, resp. rate 22, last menstrual period 10/23/2010, unknown if currently breastfeeding.  Physical Exam  Vitals reviewed. Constitutional: She is oriented to person,  place, and time. She appears well-developed and well-nourished. She appears distressed.  Cardiovascular: Regular rhythm and normal heart sounds.  Exam reveals no gallop and no friction rub.   No murmur heard. Respiratory: Breath sounds normal. No respiratory distress. She has no wheezes. She has no rales.  GI: Soft. Bowel sounds are normal. She exhibits mass (bulging 7 cm upper abdominal hernia. Very tender to touch even with distraction. Reducible. ). She exhibits no distension.  Musculoskeletal: Normal range of motion. She exhibits no edema.  Neurological: She is alert and oriented to person, place, and time.    Cervix closed-exam by Sid Falcon   Assessment and Plan  1.  Pregnancy at [redacted]w[redacted]d 2.  Abdominal Pain at hernia site-Admit for pain control and MRI in am. 3.  Hemorrhoids

## 2011-04-09 NOTE — ED Notes (Signed)
Radiologist called from [facility] regarding necissity/emergency of calling in tech for MRI on Christmas, call transferred to Dr Shawnie Pons. (had received in report that Dr Penne Lash had spoke with Dr Cherly Hensen during the night regarding same)

## 2011-04-09 NOTE — ED Notes (Signed)
Denies nausea or vomiting.  MD will admit and given pain management, plan for scan 12/26.  Explained to pt through interpretor.

## 2011-04-09 NOTE — ED Notes (Signed)
Call received from radiology for name and specifics of testing requesting, unsure if has someone on call..will call back when able to reach someone to do testing.

## 2011-04-10 ENCOUNTER — Ambulatory Visit (HOSPITAL_COMMUNITY)
Admit: 2011-04-10 | Discharge: 2011-04-10 | Disposition: A | Discharge status: Home / Self Care | Payer: Medicaid Other | Attending: Family Medicine | Admitting: Family Medicine

## 2011-04-10 LAB — CBC
MCH: 27.9 pg (ref 26.0–34.0)
MCHC: 31.9 g/dL (ref 30.0–36.0)
Platelets: 219 10*3/uL (ref 150–400)
RDW: 16.9 % — ABNORMAL HIGH (ref 11.5–15.5)

## 2011-04-10 MED ORDER — OLANZAPINE 5 MG PO TABS: 5.0000 mg | ORAL_TABLET | Freq: Every day | ORAL | Status: DC

## 2011-04-10 NOTE — Progress Notes (Signed)
[place] interpreter line called to assure that pt understood the POC.  Pt verlbalizes understanding

## 2011-04-10 NOTE — Progress Notes (Signed)
Patient ID: Bonnie Conrad, female   DOB: [birth year], 40 y.o.   MRN: [id number] Chief Complaint   Patient presents with   .  Abdominal Pain   HPI Bonnie Conrad 40 y.o. female [id number] at [redacted]w[redacted]d presenting with hemorrhoid and abdominal pain.  Patient states she feels somewhat better but still has pain over hernia.  She denies contractions/decreased fetal movement/discharge/blood from vagina/rush of fluid.   OB History    Grav  Para  Term  Preterm  Abortions  TAB  SAB  Ect  Mult  Living    9  5  5   0  3  0  3  0  0  5      Past Medical History   Diagnosis  Date   .  Depression      no treatment   .  Abdominal hernia    .  Hernia of abdominal cavity  2009    Past Surgical History   Procedure  Date   .  No past surgeries     Family History   Problem  Relation  Age of Onset   .  Hypertension  Mother     History   Substance Use Topics   .  Smoking status:  Never Smoker   .  Smokeless tobacco:  Not on file   .  Alcohol Use:  No   Allergies: No Known Allergies  Prescriptions prior to admission   Medication  Sig  Dispense  Refill   .  Prenatal Vit-Fe Fumarate-FA 29-1 MG CHEW  Chew 1 tablet by mouth daily.  90 each  3   .  PRESCRIPTION MEDICATION  Take 1 tablet by mouth at bedtime as needed. Pt states she received samples of a medication to help her sleep from a clinic. She does not know the RX name or strength given to her.     .  sertraline (ZOLOFT) 100 MG tablet  Take 1 tablet (100 mg total) by mouth daily. For depression  30 tablet  0   ROS negative except as noted in HPI  Physical Exam    Filed Vitals:   04/10/11 0600  BP: 101/53  Pulse: 78  Temp: 98.1 F (36.7 C)  Resp: 18    Physical Exam  AVSS Constitutional: She is oriented to person, place, and time. She appears well-developed and well-nourished. No apparent distress.  Cardiovascular: Regular rhythm and normal heart sounds. Respiratory: Breath sounds normal. No respiratory distress.  GI: Abdomen soft. Bowel  sounds are normal. She exhibits mass (bulging 7 cm upper abdominal hernia.  Tender to touch. Reducible. ). No rebound or guarding. She exhibits no distension.  Musculoskeletal: Normal range of motion. She exhibits no edema.  Neurological: She is alert and oriented to person, place, and time.  Cervix deferred  Assessment and Plan   1. Pregnancy at [redacted]w[redacted]d  2. Abdominal Pain at hernia site.  Scheduled for MRI today.  Has not required analgesic since yesterday at 1400 hrs. 3. Hemorrhoids

## 2011-04-10 NOTE — Discharge Summary (Addendum)
Physician Discharge Summary  Patient ID: Bonnie Conrad MRN: [id number] DOB/AGE: 1970/08/13 40 y.o.  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 *    Discharged Condition: stable  Hospital Course: 40 yo [id number] at 24w gestation admitted secondary to abdominal pain related to abdominal hernia. Patient admitted for pain management and radiologic imaging. Patient did not require extensive pain management. She always tolerated food intake without nausea or emesis and MRI revealed no evidence of hernia but rather diastasis of rectus muscle.    Consults: none  Significant Diagnostic Studies: radiology: MRI: No umbilical or ventral hernia. Diastasis of the rectus abdominal musculature    Treatments: IV hydration and analgesia: Dilaudid  Discharge Exam: Blood pressure 94/46, pulse 88, temperature 99 F (37.2 C), temperature source Oral, resp. rate 18, height 4\' 9"  (1.448 m), weight 56.654 kg (124 lb 14.4 oz), last menstrual period 10/23/2010, unknown if currently breastfeeding. General appearance: alert, cooperative and no distress GI: soft, non-tender; bowel sounds normal; no masses,  no organomegaly and gravid uterus consistent with dates Extremities: extremities normal, atraumatic, no cyanosis or edema and no edema, redness or tenderness in the calves or thighs  Disposition: Home or Self Care  Discharge Orders    Future Appointments: Provider: Department: Dept Phone: Center:   04/10/2011  1:03 PM [facility] [phone] [place]   04/17/2011 8:15 AM [facility] [phone] [facility]     Medication List  As of 04/10/2011  3:06 PM   START taking these medications         OLANZapine 5 MG tablet   Commonly known as: ZYPREXA   Take 1 tablet (5 mg total) by mouth at bedtime.         CONTINUE  taking these medications         Prenatal Vit-Fe Fumarate-FA 29-1 MG Chew   Chew 1 tablet by mouth daily.      sertraline 100 MG tablet   Commonly known as: ZOLOFT   Take 1 tablet (100 mg total) by mouth daily. For depression           STOP taking these medications         PRESCRIPTION MEDICATION          Where to get your medications    These are the prescriptions that you need to pick up. We sent them to a specific pharmacy, so you will need to go there to get them.   [id number] - [place], [place] - [address] AT [facility] OF [address]    [address] [place] [address]    Phone: [phone]        OLANZapine 5 MG tablet           Follow-up Information    Please follow up. (as scheduled on 04/17/2011 for routine prenatal care)          Signed: Brendon Christoffel 04/10/2011, 3:06 PM

## 2011-04-10 NOTE — Progress Notes (Signed)
Care link here to transport pt to [facility] for MRI procedure

## 2011-04-11 NOTE — Progress Notes (Signed)
Patient Discharge Instructions:  Admission Note Faxed,  04/11/2011 After Visit Summary Faxed,  04/11/2011 Faxed to the [facility] provider:  04/11/2011 D/C summary faxed 04/11/2011 Facesheet faxed 04/11/2011   Re-faxed information to [facility] @ [phone]  Wandra Scot, 04/11/2011, 2:46 PM

## 2011-04-17 ENCOUNTER — Ambulatory Visit (INDEPENDENT_AMBULATORY_CARE_PROVIDER_SITE_OTHER): Payer: PRIVATE HEALTH INSURANCE | Admitting: Family

## 2011-04-17 ENCOUNTER — Other Ambulatory Visit: Payer: Self-pay | Admitting: Obstetrics & Gynecology

## 2011-04-17 DIAGNOSIS — O289 Unspecified abnormal findings on antenatal screening of mother: Secondary | ICD-10-CM

## 2011-04-17 DIAGNOSIS — D649 Anemia, unspecified: Secondary | ICD-10-CM

## 2011-04-17 DIAGNOSIS — O35BXX0 Maternal care for other (suspected) fetal abnormality and damage, fetal cardiac anomalies, not applicable or unspecified: Secondary | ICD-10-CM

## 2011-04-17 DIAGNOSIS — O228X9 Other venous complications in pregnancy, unspecified trimester: Secondary | ICD-10-CM

## 2011-04-17 DIAGNOSIS — Z23 Encounter for immunization: Secondary | ICD-10-CM

## 2011-04-17 DIAGNOSIS — O99019 Anemia complicating pregnancy, unspecified trimester: Secondary | ICD-10-CM

## 2011-04-17 DIAGNOSIS — O09529 Supervision of elderly multigravida, unspecified trimester: Secondary | ICD-10-CM

## 2011-04-17 DIAGNOSIS — O358XX0 Maternal care for other (suspected) fetal abnormality and damage, not applicable or unspecified: Secondary | ICD-10-CM

## 2011-04-17 DIAGNOSIS — O2240 Hemorrhoids in pregnancy, unspecified trimester: Secondary | ICD-10-CM

## 2011-04-17 DIAGNOSIS — O283 Abnormal ultrasonic finding on antenatal screening of mother: Secondary | ICD-10-CM

## 2011-04-17 LAB — POCT URINALYSIS DIP (DEVICE)
Bilirubin Urine: NEGATIVE
Glucose, UA: NEGATIVE mg/dL
Ketones, ur: NEGATIVE mg/dL
Nitrite: NEGATIVE

## 2011-04-17 MED ORDER — TETANUS-DIPHTH-ACELL PERTUSSIS 5-2.5-18.5 LF-MCG/0.5 IM SUSP
0.5000 mL | Freq: Once | INTRAMUSCULAR | Status: AC
  Administered 2011-04-17: 0.5 mL via INTRAMUSCULAR

## 2011-04-17 NOTE — Progress Notes (Signed)
Addended by: Melissa Noon on: 04/17/2011 10:49 AM   Modules accepted: Orders

## 2011-04-17 NOTE — Progress Notes (Signed)
Addended by: Sherre Lain A on: 04/17/2011 11:05 AM   Modules accepted: Orders

## 2011-04-17 NOTE — Progress Notes (Signed)
P=79, Used interpreter Darel Hong, per Depression screening- patient denies depressed or sad mood most of the day, daily- states only sometimes.  Patient denies diminished interest/pleasure in activities daily

## 2011-04-17 NOTE — Progress Notes (Signed)
Reviewed [place]; no questions or concerns; schedule [place] to check anatomy and growth (left echogenic focus/2 blood vessel cord)

## 2011-04-23 ENCOUNTER — Ambulatory Visit (HOSPITAL_COMMUNITY)
Admission: RE | Admit: 2011-04-23 | Discharge: 2011-04-23 | Disposition: A | Discharge status: Home / Self Care | Payer: PRIVATE HEALTH INSURANCE | Source: Ambulatory Visit | Attending: Family | Admitting: Family

## 2011-04-23 DIAGNOSIS — Z3689 Encounter for other specified antenatal screening: Secondary | ICD-10-CM | POA: Insufficient documentation

## 2011-04-23 DIAGNOSIS — O283 Abnormal ultrasonic finding on antenatal screening of mother: Secondary | ICD-10-CM

## 2011-04-23 DIAGNOSIS — O358XX0 Maternal care for other (suspected) fetal abnormality and damage, not applicable or unspecified: Secondary | ICD-10-CM

## 2011-04-23 DIAGNOSIS — IMO0001 Reserved for inherently not codable concepts without codable children: Secondary | ICD-10-CM | POA: Insufficient documentation

## 2011-04-29 NOTE — ED Provider Notes (Signed)
Chart reviewed and agree with management and plan.  

## 2011-05-01 ENCOUNTER — Ambulatory Visit (INDEPENDENT_AMBULATORY_CARE_PROVIDER_SITE_OTHER): Payer: PRIVATE HEALTH INSURANCE | Admitting: Advanced Practice Midwife

## 2011-05-01 DIAGNOSIS — Z331 Pregnant state, incidental: Secondary | ICD-10-CM

## 2011-05-01 DIAGNOSIS — F323 Major depressive disorder, single episode, severe with psychotic features: Secondary | ICD-10-CM

## 2011-05-01 LAB — POCT URINALYSIS DIP (DEVICE)
Bilirubin Urine: NEGATIVE
Bilirubin Urine: NEGATIVE
Glucose, UA: NEGATIVE mg/dL
Glucose, UA: NEGATIVE mg/dL
Ketones, ur: NEGATIVE mg/dL
Protein, ur: 30 mg/dL — AB
Specific Gravity, Urine: 1.025 (ref 1.005–1.030)
Urobilinogen, UA: 1 mg/dL (ref 0.0–1.0)

## 2011-05-01 LAB — CBC
HCT: 27.4 % — ABNORMAL LOW (ref 36.0–46.0)
Hemoglobin: 8.8 g/dL — ABNORMAL LOW (ref 12.0–15.0)
RBC: 3.19 MIL/uL — ABNORMAL LOW (ref 3.87–5.11)
WBC: 7.9 10*3/uL (ref 4.0–10.5)

## 2011-05-01 MED ORDER — OLANZAPINE 5 MG PO TABS: 5.0000 mg | ORAL_TABLET | Freq: Every day | ORAL | Status: AC

## 2011-05-01 MED ORDER — SERTRALINE HCL 100 MG PO TABS: 100.0000 mg | ORAL_TABLET | Freq: Every day | ORAL | Status: DC

## 2011-05-01 NOTE — Progress Notes (Signed)
Pt at [facility] center. Missed appointment yesterday. Almost out of meds. Rx 1 week Zyprexa and 1 month Zoloft. Strongly encouraged to rescheduled appt at [facility] and request counseling as well. Reports stable mood.

## 2011-05-01 NOTE — Patient Instructions (Signed)
Embarazo - Segundo trimestre (Pregnancy - Second Trimester) El segundo trimestre del embarazo (del 3 al 6mes) es un perodo de evolucin rpida para usted y el beb. Hacia el final del sexto mes, el beb mide aproximadamente 23 cm y pesa 680 g. Comenzar a sentir los movimientos del beb entre las 18 y las 20 semanas de embarazo. Podr sentir las pataditas ("quickening en ingls"). Hay un rpido aumento de peso. Puede segregar un lquido claro (calostro) de las mamas. Quizs sienta pequeas contracciones en el vientre (tero) Esto se conoce como falso trabajo de parto o contracciones de Braxton-Hicks. Es como una prctica del trabajo de parto que se produce cuando el beb est listo para salir. Generalmente los problemas de vmitos matinales ya se han superado hacia el final del primer trimestre. Algunas mujeres desarrollan pequeas manchas oscuras (que se denominan cloasma, mscara del embarazo) en la cara que normalmente se van luego del nacimiento del beb. La exposicin al sol empeora las manchas. Puede desarrollarse acn en algunas mujeres embarazadas, y puede desaparecer en aquellas que ya tienen acn. EXAMENES PRENATALES  Durante los exmenes prenatales, deber seguir realizando pruebas de sangre, segn avance el embarazo. Estas pruebas se realizan para controlar su salud y la del beb. Tambin se realizan anlisis de sangre para conocer los niveles de hemoglobina. La anemia (bajo nivel de hemoglobina) es frecuente durante el embarazo. Para prevenirla, se administran hierro y vitaminas. Tambin se le realizarn exmenes para saber si tiene diabetes entre las 24 y las 28 semanas del embarazo. Podrn repetirle algunas de las pruebas que le hicieron previamente.   En cada visita le medirn el tamao del tero. Esto se realiza para asegurarse de que el beb est creciendo correctamente de acuerdo al estado del embarazo.   Tambin en cada visita prenatal controlarn su presin arterial. Esto se realiza  para asegurarse de que no tenga toxemia.   Se controlar su orina para asegurarse de que no tenga infecciones, diabetes o protena en la orina.   Se controlar su peso regularmente para asegurarse que el aumento ocurre al ritmo indicado. Esto se hace para asegurarse que usted y el beb tienen una evolucin normal.   En algunas ocasiones se realiza una prueba de ultrasonido para confirmar el correcto desarrollo y evolucin del beb. Esta prueba se realiza con ondas sonoras inofensivas para el beb, de modo que el profesional pueda calcular ms precisamente la fecha del parto.  Algunas veces se realizan pruebas especializadas del lquido amnitico que rodea al beb. Esta prueba se denomina amniocentesis. El lquido amnitico se obtiene introduciendo una aguja en el vientre (abdomen). Se realiza para controlar los cromosomas en aquellos casos en los que existe alguna preocupacin acerca de algn problema gentico que pueda sufrir el beb. En ocasiones se lleva a cabo cerca del final del embarazo, si es necesario inducir al parto. En este caso se realiza para asegurarse que los pulmones del beb estn lo suficientemente maduros como para que pueda vivir fuera del tero. CAMBIOS QUE OCURREN EN EL SEGUNDO TRIMESTRE DEL EMBARAZO Su organismo atravesar numerosos cambios durante el embarazo. Estos pueden variar de una persona a otra. Converse con el profesional que la asiste acerca los cambios que usted note y que la preocupen.  Durante el segundo trimestre probablemente sienta un aumento del apetito. Es normal tener "antojos" de ciertas comidas. Esto vara de una persona a otra y de un embarazo a otro.   El abdomen inferior comenzar a abultarse.   Podr tener la necesidad   de orinar con ms frecuencia debido a que el tero y el beb presionan sobre la vejiga. Tambin es frecuente contraer ms infecciones urinarias durante el embarazo (dolor al orinar). Puede evitarlas bebiendo gran cantidad de lquidos y  vaciando la vejiga antes y despus de mantener relaciones sexuales.   Podrn aparecer las primeras estras en las caderas, abdomen y mamas. Estos son cambios normales del cuerpo durante el embarazo. No existen medicamentos ni ejercicios que puedan prevenir estos cambios.   Es posible que comience a desarrollar venas inflamadas y abultadas (varices) en las piernas. El uso de medias de descanso, elevar sus pies durante 15 minutos, 3 a 4 veces al da y limitar la sal en su dieta ayuda a aliviar el problema.   Podr sentir acidez gstrica a medida que el tero crece y presiona contra el estmago. Puede tomar anticidos, con la autorizacin de su mdico, para aliviar este problema. Tambin es til ingerir pequeas comidas 4 a 5 veces al da.   La constipacin puede tratarse con un laxante o agregando fibra a su dieta. Beber grandes cantidades de lquidos, comer vegetales, frutas y granos integrales es de gran ayuda.   Tambin es beneficioso practicar actividad fsica. Si ha sido una persona activa hasta el embarazo, podr continuar con la mayora de las actividades durante el mismo. Si ha sido menos activa, puede ser beneficioso que comience con un programa de ejercicios, como realizar caminatas.   Puede desarrollar hemorroides (vrices en el recto) hacia el final del segundo trimestre. Tomar baos de asiento tibios y utilizar cremas recomendadas por el profesional que lo asiste sern de ayuda para los problemas de hemorroides.   Tambin podr sentir dolor de espalda durante este momento de su embarazo. Evite levantar objetos pesados, utilice zapatos de taco bajo y mantenga una buena postura para ayudar a reducir los problemas de espalda.   Algunas mujeres embarazadas desarrollan hormigueo y adormecimiento de la mano y los dedos debido a la hinchazn y compresin de los ligamentos de la mueca (sndrome del tnel carpiano). Esto desaparece una vez que el beb nace.   Como sus pechos se agrandan,  necesitar un sujetador ms grande. Use un sostn de soporte, cmodo y de algodn. No utilice un sostn para amamantar hasta el ltimo mes de embarazo si va a amamantar al beb.   Podr observar una lnea oscura desde el ombligo hacia la zona pbica denominada linea nigra.   Podr observar que sus mejillas se ponen coloradas debido al aumento de flujo sanguneo en la cara.   Podr desarrollar "araitas" en la cara, cuello y pecho. Esto desaparece una vez que el beb nace.  INSTRUCCIONES PARA EL CUIDADO DOMICILIARIO  Es extremadamente importante que evite el cigarrillo, hierbas medicinales, alcohol y las drogas no prescriptas durante el embarazo. Estas sustancias qumicas afectan la formacin y el desarrollo del beb. Evite estas sustancias durante todo el embarazo para asegurar el nacimiento de un beb sano.   La mayor parte de los cuidados que se aconsejan son los mismos que los indicados para el primer trimestre del embarazo. Cumpla con las citas tal como se le indic. Siga las instrucciones del profesional que lo asiste con respecto al uso de los medicamentos, el ejercicio y la dieta.   Durante el embarazo debe obtener nutrientes para usted y para su beb. Consuma alimentos balanceados a intervalos regulares. Elija alimentos como carne, pescado, leche y otros productos lcteos descremados, vegetales, frutas, panes integrales y cereales. El profesional le informar cul es el   aumento de peso ideal.   Las relaciones sexuales fsicas pueden continuarse hasta cerca del fin del embarazo si no existen otros problemas. Estos problemas pueden ser la prdida temprana (prematura) de lquido amnitico de las membranas, sangrado vaginal, dolor abdominal u otros problemas mdicos o del embarazo.   Realice actividad fsica todos los das, si no tiene restricciones. Consulte con el profesional que la asiste si no sabe con certeza si determinados ejercicios son seguros. El mayor aumento de peso tiene lugar  durante los ltimos 2 trimestres del embarazo. El ejercicio la ayudar a:   Controlar su peso.   Ponerla en forma para el parto.   Ayudarla a perder peso luego de haber dado a luz.   Use un buen sostn o como los que se usan para hacer deportes para aliviar la sensibilidad de las mamas. Tambin puede serle til si lo usa mientras duerme. Si pierde calostro, podr utilizar apsitos en el sostn.   No utilice la baera con agua caliente, baos turcos y saunas durante el embarazo.   Utilice el cinturn de seguridad sin excepcin cuando conduzca. Este la proteger a usted y al beb en caso de accidente.   Evite comer carne cruda, queso crudo, y el contacto con los utensilios y desperdicios de los gatos. Estos elementos contienen grmenes que pueden causar defectos de nacimiento en el beb.   El segundo trimestre es un buen momento para visitar a su dentista y evaluar su salud dental si an no lo ha hecho. Es importante mantener los dientes limpios. Utilice un cepillo de dientes blando. Cepllese ms suavemente durante el embarazo.   Es ms fcil perder algo de orina durante el embarazo. Apretar y fortalecer los msculos de la pelvis la ayudar con este problema. Practique detener la miccin cuando est en el bao. Estos son los mismos msculos que necesita fortalecer. Son tambin los mismos msculos que utiliza cuando trata de evitar los gases. Puede practicar apretando estos msculos 10 veces, y repetir esto 3 veces por da aproximadamente. Una vez que conozca qu msculos debe apretar, no realice estos ejercicios durante la miccin. Puede favorecerle una infeccin si la orina vuelve hacia atrs.   Pida ayuda si tiene necesidades econmicas, de asesoramiento o nutricionales durante el embarazo. El profesional podr ayudarla con respecto a estas necesidades, o derivarla a otros especialistas.   La piel puede ponerse grasa. Si esto sucede, lvese la cara con un jabn suave, utilice un humectante no  graso y maquillaje con base de aceite o crema.  CONSUMO DE MEDICAMENTOS Y DROGAS DURANTE EL EMBARAZO  Contine tomando las vitaminas apropiadas para esta etapa tal como se le indic. Las vitaminas deben contener un miligramo de cido flico y deben suplementarse con hierro. Guarde todas las vitaminas fuera del alcance de los nios. La ingestin de slo un par de vitaminas o tabletas que contengan hierro puede ocasionar la muerte en un beb o en un nio pequeo.   Evite el uso de medicamentos, inclusive los de venta libre y hierbas que no hayan sido prescriptos o indicados por el profesional que la asiste. Algunos medicamentos pueden causar problemas fsicos al beb. Utilice los medicamentos de venta libre o de prescripcin para el dolor, el malestar o la fiebre, segn se lo indique el profesional que lo asiste. No utilice aspirina.   El consumo de alcohol est relacionado con ciertos defectos de nacimiento. Esto incluye el sndrome de alcoholismo fetal. Debe evitar el consumo de alcohol en cualquiera de sus formas. El cigarrillo   causa nacimientos prematuros y bebs de bajo peso. El uso de drogas recreativas est absolutamente prohibido. Son muy nocivas para el beb. Un beb que nace de una madre adicta, ser adicto al nacer. Ese beb tendr los mismos sntomas de abstinencia que un adulto.   Infrmele al profesional si consume alguna droga.   No consuma drogas ilegales. Pueden causarle mucho dao al beb.  SOLICITE ATENCIN MDICA SI: Tiene preguntas o preocupaciones durante su embarazo. Es mejor que llame para consultar las dudas que esperar hasta su prxima visita prenatal. De esta forma se sentir ms tranquila.  SOLICITE ATENCIN MDICA DE INMEDIATO SI:  La temperatura oral se eleva sin motivo por encima de 102 F (38.9 C) o segn le indique el profesional que lo asiste.   Tiene una prdida de lquido por la vagina (canal de parto). Si sospecha una ruptura de las membranas, tmese la  temperatura y llame al profesional para informarlo sobre esto.   Observa unas pequeas manchas, una hemorragia vaginal o elimina cogulos. Notifique al profesional acerca de la cantidad y de cuntos apsitos est utilizando. Unas pequeas manchas de sangre son algo comn durante el embarazo, especialmente despus de mantener relaciones sexuales.   Presenta un olor desagradable en la secrecin vaginal y observa un cambio en el color, de transparente a blanco.   Contina con las nuseas y no obtiene alivio de los remedios indicados. Vomita sangre o algo similar a la borra del caf.   [place] o sube ms de 900 g. en una semana, o segn lo indicado por el profesional que la asiste.   Observa que se le hinchan el rostro, las manos, los pies o las piernas.   Ha estado expuesta a la rubola y no ha sufrido la enfermedad.   Ha estado expuesta a la quinta enfermedad o a la varicela.   Presenta dolor abdominal. Las molestias en el ligamento redondo son una causa no cancerosa (benigna) frecuente de dolor abdominal durante el embarazo. El profesional que la asiste deber evaluarla.   Presenta dolor de cabeza intenso que no se alivia.   Presenta fiebre, diarrea, dolor al orinar o le falta la respiracin.   Presenta dificultad para ver, visin borrosa, o visin doble.   Sufre una cada, un accidente de trnsito o cualquier tipo de trauma.   Vive en un hogar en el que existe violencia fsica o mental.  Document Released: 01/09/2005 Document Revised: 12/12/2010 [employer] Patient Information 2012 [employer]. 

## 2011-05-01 NOTE — Progress Notes (Signed)
Pulse-  95 

## 2011-05-02 LAB — RPR

## 2011-05-02 LAB — GLUCOSE TOLERANCE, 1 HOUR: Glucose, 1 Hour GTT: 120 mg/dL (ref 70–140)

## 2011-05-04 ENCOUNTER — Encounter: Payer: Self-pay | Admitting: Family

## 2011-05-08 NOTE — Progress Notes (Signed)
UR chart review completed.  

## 2011-05-15 ENCOUNTER — Encounter: Payer: Self-pay | Admitting: Advanced Practice Midwife

## 2011-05-15 ENCOUNTER — Ambulatory Visit (INDEPENDENT_AMBULATORY_CARE_PROVIDER_SITE_OTHER): Payer: Self-pay | Admitting: Advanced Practice Midwife

## 2011-05-15 DIAGNOSIS — O283 Abnormal ultrasonic finding on antenatal screening of mother: Secondary | ICD-10-CM

## 2011-05-15 DIAGNOSIS — O358XX0 Maternal care for other (suspected) fetal abnormality and damage, not applicable or unspecified: Secondary | ICD-10-CM

## 2011-05-15 DIAGNOSIS — O289 Unspecified abnormal findings on antenatal screening of mother: Secondary | ICD-10-CM

## 2011-05-15 DIAGNOSIS — O09529 Supervision of elderly multigravida, unspecified trimester: Secondary | ICD-10-CM

## 2011-05-15 LAB — POCT URINALYSIS DIP (DEVICE)
Bilirubin Urine: NEGATIVE
Glucose, UA: NEGATIVE mg/dL
Ketones, ur: NEGATIVE mg/dL
Nitrite: NEGATIVE

## 2011-05-15 MED ORDER — FERROUS SULFATE 300 (60 FE) MG/5ML PO SYRP: 300.0000 mg | ORAL_SOLUTION | Freq: Every day | ORAL | Status: DC

## 2011-05-15 NOTE — Progress Notes (Signed)
Pulse-97   Pain/pressure- ligament pain.  Recheck BP 84/52

## 2011-05-15 NOTE — Progress Notes (Signed)
Feels well except hernia is painful at times. Has appt. [facility] on Friday. Glucola = 120.  Hgb 8.8 >> will start Iron

## 2011-05-15 NOTE — Patient Instructions (Signed)
Anemia, No Especfica (Anemia, Nonspecific) Sus pruebas y exmenes de sangre muestran que usted tiene anemia. Esto significa que su nivel de hemoglobina (sangre) es demasiado bajo. Los valores normales de hemoglobina son 12 a 15 en mujeres y 14 a 17 en hombres. Tome nota de su nivel de hemoglobina en la actualidad. Tambin se utiliza el porcentaje de hematocritos para medir la anemia. Un nivel normal de hematocritos es 38 a 46 en mujeres y 42 a 49 en hombres. Tome nota de su porcentaje de hematocritos en la actualidad. CAUSAS La anemia puede deberse a muchas causas diferentes.  Sangrado excesivo del perodo menstrual (en mujeres).   Hemorragia intestinal.   Dficit nutricional.   Enfermedades renales, de la tiroides, hepticas y de la mdula sea.  SINTOMAS La anemia puede ocurrir repentinamente Huston Foley). Tambin puede ocurrir lentamente (crnica). Los sntomas incluyen:  Ddebilidad leve.   Mareos.   Palpitaciones.   Falta de aire.  Es posible que no tenga sntomas hasta que falte la mitad de hemoglobina, si se instala lent+amente. Es posible que sea necesario realizar una transfusin en el caso de que haya ocurrido una prdida importante y repentina de sangre debido a una herida o a una hemorragia interna. Es necesaria la atencin hospitalaria si usted est anmico y Financial risk analyst una prdida de sangre significativa. TRATAMIENTO  A menudo son necesarios exmenes de la materia fecal para Engineer, manufacturing sangre (Hemoccult) y otros exmenes adicionales. Esto determina Consulting civil engineer.   Es [employer] importante que se siga evaluando su trastorno y su respuesta al tratamiento. A menudo lleva muchas semanas lograr corregir la anemia.  Segn la causa, el tratamiento puede incluir:  Suplementos de hierro.   Vitamina B12 y cido flico.   Medicamentos hormonales.Si su anemia se debe a un sangrado, es Lockheed Martin causa de la prdida de [place]. Esto ayudar a Teacher, music.    SOLICITE ATENCIN MDICA DE INMEDIATO SI:  Se desmaya, se siente muy dbil, le falta el aire o siente dolor en el pecho.   Desarrolla un sangrado vaginal.   Sus heces son sanguinolentas, negras o alquitranadas, o vomita sangre.   Desarrolla fiebre alta, sarpullidos, vmitos a repeticin o deshidratacin.  Document Released: 04/01/2005 Document Revised: 12/12/2010 [facility] Patient Information 2012 [place], [place].Vanetta Mulders - Systems analyst trimestre (Pregnancy - Third Trimester) El tercer trimestre del embarazo (los ltimos 3 meses) es el perodo de cambios ms rpidos que atraviesan usted y el beb. El aumento de peso es ms rpido. El beb alcanza un largo de aproximadamente 50 cm (20 pulgadas) y pesa entre 2,700 y 4,500 kg (6 a 10 libras). El beb gana ms tejido graso y ya est listo para la vida fuera del cuerpo de la [place]. Mientras estn en el interior, los bebs tienen perodos de sueo y vigilia, Warehouse manager y tienen hipo. Quizs sienta pequeas contracciones del tero. Este es el falso trabajo de [place]. Tambin se las conoce como contracciones de [place]. Es como una prctica del parto. Los problemas ms habituales de esta etapa del embarazo incluyen mayor dificultad para respirar, hinchazn de las manos y los pies por retencin de lquidos y la necesidad de Geographical information systems officer con ms frecuencia debido a que el tero y el beb presionan sobre la vejiga.  EXAMENES PRENATALES  Durante los [employer]power [employer], deber seguir realizando pruebas de [place], segn avance el [place]. Estas pruebas se realizan para controlar su salud y la del beb. Tambin se realizan anlisis de sangre para [employer] niveles de [place]. La  anemia (bajo nivel de hemoglobina) es frecuente durante el embarazo. Para prevenirla, se administran hierro y vitaminas. Tambin le harn nuevas pruebas para descartar la diabetes. Podrn repetirle algunas de las Hovnan[employer] Enterprises hicieron previamente.   En cada visita le medirn  el tamao del tero. Es para asegurarse de que el beb se desarrolla correctamente.   Tambin en cada visita la pesarn. Esto se realiza para asegurarse de que aumenta de peso al ritmo indicado y que usted y su beb evolucionan normalmente.   En algunas ocasiones se realiza una ecografa para confirmar el correcto desarrollo y evolucin del beb. Esta prueba se realiza con ondas sonoras inofensivas para el beb, de modo que el profesional pueda calcular con ms precisin la fecha del [place].   Discuta las posibilidades de la anestesia si necesita cesrea.  Algunas veces se realizan pruebas especializadas del lquido amnitico que rodea al beb. Esta prueba se denomina amniocentesis. El lquido amnitico se obtiene introduciendo una aguja en el abdomen (vientre). En ocasiones se lleva a cabo cerca del final del embarazo, si es Optician, dispensing. En este caso se realiza para asegurarse de que los pulmones del beb estn lo suficientemente maduros como para que pueda vivir fuera del tero. CAMBIOS QUE OCURREN EN EL TERCER TRIMESTRE DEL EMBARAZO Su organismo atravesar diferentes cambios durante el embarazo que varan de Neomia Dear persona a Educational psychologist. Converse con el profesional que la asiste acerca los cambios que usted note y que la preocupen.  Durante el ltimo trimestre probablemente sienta un aumento del apetito. Es normal tener "antojos" de Development worker, community. Esto vara de Neomia Dear persona a otra y de un embarazo a Therapist, art.   Podrn aparecer las primeras estras en las caderas, abdomen y [place]. Estos son cambios normales del cuerpo durante el [place]. No existen medicamentos ni ejercicios que puedan prevenir CarMax.   El estreimiento puede tratarse con un laxante o agregando fibra a su dieta. Beber grandes cantidades de lquidos, tomar fibras en forma de verduras, frutas y granos integrales es de [place].   Tambin es beneficioso practicar actividad fsica. Si ha sido una persona Hotel manager, podr continuar con la Harley[employer]son de las actividades durante el mismo. Si ha sido [employer], puede ser beneficioso que comience con un programa de ejercicios, Museum/gallery exhibitions officer. Consulte con el profesional que la asiste antes de comenzar un programa de ejercicios.   Evite el consumo de cigarrillos, el alcohol, los medicamentos no prescritos y las "drogas de la calle" durante el [place]. Estas sustancias qumicas afectan la formacin y el desarrollo del beb. Evite estas sustancias durante todo el embarazo para asegurar el nacimiento de un beb sano.   Dolor de espalda, venas varicosas y hemorroides podran aparecer o empeorar.   Los movimientos del beb pueden ser ms bruscos y aparecer ms a menudo.   Puede que note dificultades para respirar facilmente.   El ombligo podra salrsele hacia afuera.   Puede segregar un lquido amarillento (calostro) de las [place].   Puede segregar mucus con sangre. Esto normalmente ocurre unos [address] a una semana antes de que comience el [place].  INSTRUCCIONES PARA EL CUIDADO DOMICILIARIO  La mayor parte de los cuidados que se aconsejan son los mismos que los indicados para las primeras etapas del Psychiatrist. Es importante que concurra a todas las citas con el profesional y siga sus instrucciones con Camera operator a los medicamentos que deba Chemical engineer, a la actividad fsica y a Psychologist, forensic.  Durante el embarazo debe obtener nutrientes para usted y para su beb. Consuma alimentos balanceados a intervalos regulares. Elija alimentos como carne, pescado, [place] y otros productos lcteos descremados, verduras, frutas, panes integrales y cereales. El Equities trader cul es el aumento de peso ideal.   Las relaciones sexuales pueden continuarse hasta casi el final del embarazo, si no se presentan otros problemas como prdida prematura (antes de tiempo) de lquido amnitico, hemorragia vaginal o dolor abdominal (en el vientre).   Realice  T[employer] Corporation, si no tiene restricciones. Consulte con el profesional que la asiste si no sabe con certeza si determinados ejercicios son seguros. El mayor aumento de peso se produce Foot Locker ltimos trimestres del [place].   Haga reposo con frecuencia, con las piernas elevadas, o segn lo necesite para evitar los calambres y el dolor de cintura.   Use un buen sostn o como los que se usan para hacer deportes para Paramedic la sensibilidad de las [place]. Tambin puede serle til si lo [place] mientras duerme. Si pierde Product manager, podr Parker Hann[employer]in.   No utilice la baera con agua caliente, baos turcos y saunas.   Colquese el cinturn de seguridad cuando conduzca. Este la proteger a usted y al beb en caso de accidente.   Evite comer carne cruda y el contacto con los utensilios y desperdicios de los gatos. Estos elementos contienen grmenes que pueden causar defectos de nacimiento en el beb.   Es fcil perder algo de orina durante el [place]. Apretar y Chief Operating Officer los msculos de la pelvis la ayudar con este problema. Practique detener la miccin cuando est en el bao. Estos son los mismos msculos que Development worker, international aid. Son [employer] Partners mismos msculos que utiliza cuando trata de Ryder System gases. Puede practicar apretando estos msculos WellPoint, y repetir esto tres veces por da aproximadamente. Una vez que conozca qu msculos debe contraer, no realice estos ejercicios durante la miccin. Puede favorecerle una infeccin si la orina vuelve hacia atrs.   Pida ayuda si tiene necesidades econmicas, de asesoramiento o nutricionales durante el [place]. El profesional podr ayudarla con respecto a estas necesidades, o derivarla a otros especialistas.   Practique la ida [employer] General hospital a modo de [place].   Tome clases prenatales junto con su pareja para comprender, practicar y hacer preguntas acerca del Aleen Campi de parto y el nacimiento.   Prepare la  habitacin del beb.   No viaje fuera de la ciudad a menos que sea absolutamente necesario y con el consejo del mdico.   Use slo zapatos bajos sin taco para tener un mejor equilibrio y prevenir cadas.  EL CONSUMO DE MEDICAMENTOS Y DROGAS DURANTE EL EMBARAZO  Contine tomando las vitaminas apropiadas para esta etapa tal como se le indic. Las vitaminas deben contener un miligramo de cido flico y deben suplementarse con hierro. Guarde todas las vitaminas fuera del alcance de los nios. La ingestin de slo un par de vitaminas o comprimidos que contengan hierro pueden ocasionar la Newmont Mining en un beb o en un nio pequeo.   Evite el uso de [place], inclusive los de venta [place], que no hayan sido prescritos o indicados por el profesional que la asiste. Algunos medicamentos pueden causar problemas fsicos al beb. Utilice los medicamentos de venta libre o de prescripcin para Chief Technology Officer, Environmental health practitioner o la [place], segn se lo indique el profesional que lo asiste. No utilice aspirina, ibuprofeno (Motrin, Advil, Nuprin) o naproxeno (Aleve) a menos que  el profesional la autorice.   El alcohol se asocia a cierto nmero de defectos del nacimiento, incluido el sndrome de alcoholismo fetal. Debe evitar el consumo de alcohol en cualquiera de sus formas. El cigarrillo causa nacimientos prematuros y bebs de bajo peso al nacer. Las drogas de la calle son muy nocivas para el beb y estn absolutamente prohibidas. Un beb que nace de American Express, ser adicto al nacer. Ese beb tendr los mismos sntomas de abstinencia que un adulto.   Infrmele al profesional si consume alguna droga.  SOLICITE ATENCIN MDICA SI: Tiene alguna preocupacin Academic librarian. Es mejor que llame para formular las preguntas si no puede esperar hasta la prxima visita, que sentirse preocupada por ellas.  DECISIONES ACERCA DE LA CIRCUNCISIN Usted puede saber o no cul es el sexo de su beb. Si es un varn, ste es el  momento de pensar acerca de la circuncisin. La circuncisin es la extirpacin del prepucio. Esta es la piel que cubre el extremo sensible del pene. No hay un motivo mdico que lo justifique. Generalmente la decisin se toma segn lo que sea popular en ese momento, o se basa en creencias religiosas. Podr conversar estos temas con el profesional que la asiste. SOLICITE ATENCIN MDICA DE INMEDIATO SI:  La temperatura oral se eleva sin motivo por encima de 102 F (38.9 C) o segn le indique el profesional que la asiste.   Tiene una prdida de lquido por la vagina (canal de parto). Si sospecha una ruptura de las [place], tmese la temperatura y llame al profesional para informarlo sobre esto.   Observa unas pequeas manchas, una hemorragia vaginal o elimina cogulos. Avsele al profesional acerca de la cantidad y de cuntos apsitos est utilizando.   Presenta un olor desagradable en la secrecin vaginal y observa un cambio en el color, de transparente a blanco.   Ha vomitado durante ms de 24 horas.   Presenta escalofros o fiebre.   Comienza a sentir falta de aire.   Siente ardor al Beatrix Shipper.   [place] o sube ms de 900 g (ms de 2 libras), o segn lo indicado por el profesional que la asiste. Observa que sbitamente se le hinchan el rostro, las manos, los pies o las piernas.   Presenta dolor abdominal. Las molestias en el ligamento redondo son Neomia Dear causa benigna (no cancerosa) frecuente de Engineer, mining abdominal durante el Psychiatrist, pero el profesional que la asiste deber evaluarlo.   Presenta dolor de cabeza intenso que no se [place].   Si no siente los movimientos del beb durante ms de tres horas. Si piensa que el beb no se mueve tanto como lo haca habitualmente, coma algo que Psychologist, clinical y Target Corporation lado izquierdo durante [place]. El beb debe moverse al menos 4  5 veces por hora. Comunquese inmediatamente si el beb se mueve menos que lo indicado.   Se cae, se ve  involucrada en un accidente automovilstico o sufre algn tipo de traumatismo.   En su hogar hay violencia mental o fsica.  Document Released: 01/09/2005 Document Revised: 12/12/2010 [facility] Patient Information 2012 [place], [place].

## 2011-05-29 ENCOUNTER — Ambulatory Visit (INDEPENDENT_AMBULATORY_CARE_PROVIDER_SITE_OTHER): Payer: Self-pay | Admitting: Physician Assistant

## 2011-05-29 VITALS — BP 100/47 | Temp 97.1°F | Wt 126.6 lb

## 2011-05-29 DIAGNOSIS — O09529 Supervision of elderly multigravida, unspecified trimester: Secondary | ICD-10-CM

## 2011-05-29 LAB — POCT URINALYSIS DIP (DEVICE)
Bilirubin Urine: NEGATIVE
Ketones, ur: NEGATIVE mg/dL
Specific Gravity, Urine: 1.02 (ref 1.005–1.030)
pH: 7 (ref 5.0–8.0)

## 2011-05-29 NOTE — Progress Notes (Signed)
Addended by: August Luz on: 05/29/2011 11:23 AM   Modules accepted: Orders

## 2011-05-29 NOTE — Patient Instructions (Signed)
Prevencin de Sport and exercise psychologist (Preventing Preterm Labor) Un parto prematuro ocurre cuando la mujer embarazada tiene contracciones uterinas que causan la apertura, el acortamiento y el afinamiento del cuello del tero, antes de las 37 semanas de [place]. Tendr contracciones regulares cada 2 a 3 minutos. Esto generalmente causa molestias o dolor. CUIDADOS EN EL HOGAR  Consuma una dieta saludable.   Johnson [employer] Johnson las vitaminas segn le haya indicado el mdico.   Beba una cantidad de lquido suficiente como para Pharmacologist la orina de tono claro o color amarillo plido todos [place].   Descanse y duerma.   No tenga relaciones sexuales si tiene un parto prematuro o alto riesgo de tenerlo.   Siga las instrucciones del mdico acerca de su [place], los medicamentos y los exmenes.   Evite el estrs.   Evite los esfuerzos extenuantes o la actividad fsica extensa.   No fume.  SOLICITE AYUDA DE INMEDIATO SI:   Tiene contracciones.   Siente dolor abdominal.   Tiene sangrado que proviene de la vagina.   Siente dolor al ConocoPhillips.   Observa una secrecin anormal que proviene de la vagina.   Tiene una temperatura oral de ms de 102 F (38.9 C).  ASEGRESE DE QUE:  Comprende estas instrucciones.   Controlar su enfermedad.   Solicitar ayuda si no mejora o si empeora.  Document Released: 05/04/2010 Document Revised: 12/12/2010 [facility] Patient Information 2012 [place], [place].  Continua tomando tus medicinas: Zoloft y Zyprexa

## 2011-05-29 NOTE — Progress Notes (Signed)
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 Has not been taking her medications for about 3 weeks now since Dr. Dicky Doe [phone]) told her she needs to double check with [place] regarding this medications in pregnancy. No symptoms of depression currently no harm to herself or others. No hallucinations. No other complaints. Urine positive for blood, leukocytes and trace of protein. No urinary symptoms. Urine culture ordered. Preterm labor precautions discussed.  Pt wants to breast feed.

## 2011-05-31 LAB — CULTURE, OB URINE: Colony Count: NO GROWTH

## 2011-06-08 ENCOUNTER — Inpatient Hospital Stay (HOSPITAL_COMMUNITY): Payer: Self-pay

## 2011-06-08 ENCOUNTER — Encounter (HOSPITAL_COMMUNITY): Payer: Self-pay | Admitting: *Deleted

## 2011-06-08 ENCOUNTER — Inpatient Hospital Stay (HOSPITAL_COMMUNITY)
Admission: AD | Admit: 2011-06-08 | Discharge: 2011-06-08 | Disposition: A | Discharge status: Home / Self Care | Payer: Self-pay | Source: Ambulatory Visit | Attending: Obstetrics & Gynecology | Admitting: Obstetrics & Gynecology

## 2011-06-08 DIAGNOSIS — K439 Ventral hernia without obstruction or gangrene: Secondary | ICD-10-CM | POA: Insufficient documentation

## 2011-06-08 DIAGNOSIS — R109 Unspecified abdominal pain: Secondary | ICD-10-CM | POA: Insufficient documentation

## 2011-06-08 DIAGNOSIS — K469 Unspecified abdominal hernia without obstruction or gangrene: Secondary | ICD-10-CM

## 2011-06-08 DIAGNOSIS — O99891 Other specified diseases and conditions complicating pregnancy: Secondary | ICD-10-CM | POA: Insufficient documentation

## 2011-06-08 LAB — URINE MICROSCOPIC-ADD ON

## 2011-06-08 LAB — URINALYSIS, ROUTINE W REFLEX MICROSCOPIC
Protein, ur: NEGATIVE mg/dL
Urobilinogen, UA: 0.2 mg/dL (ref 0.0–1.0)

## 2011-06-08 MED ORDER — OXYCODONE-ACETAMINOPHEN 5-325 MG PO TABS: 1.0000 | ORAL_TABLET | ORAL | Status: AC | PRN

## 2011-06-08 MED ORDER — OXYCODONE-ACETAMINOPHEN 5-325 MG PO TABS
1.0000 | ORAL_TABLET | ORAL | Status: DC | PRN
  Administered 2011-06-08: 2 via ORAL
  Filled 2011-06-08: qty 2

## 2011-06-08 NOTE — Progress Notes (Signed)
OK to d/c efm per Dr Mikel Cella. Pt up to BR.

## 2011-06-08 NOTE — Progress Notes (Signed)
Written and verbal d/c instructions given and understanding voiced. 

## 2011-06-08 NOTE — Progress Notes (Signed)
Genella Rife -in-house interpreter helping during pt's visit

## 2011-06-08 NOTE — Progress Notes (Signed)
Pt states is doing ok with depression now.

## 2011-06-08 NOTE — ED Notes (Signed)
Amber, with Dr Adrian Blackwater, given report of pt's admission and status. U/A ordered and will see pt

## 2011-06-08 NOTE — ED Provider Notes (Signed)
History    Chief Complaint  Patient presents with  . Abdominal Pain   HPI Patient is a 41yo [id number] who presents for increasing pain from known abdominal hernia. Patient states her hernia started 4 years ago. She has had pain like this multiple times. Last time she had this pain was in December. She was evaluated by MRI at that time which showed diastasis recti. She did have an ultrasound prior to that which showed ventral hernia. She denies any contractions. She is feeling the baby move. Denies any bleeding, fluid loss or vaginal discharge.  Interpreter present throughout entire H&P.  OB History    Grav Para Term Preterm Abortions TAB SAB Ect Mult Living   9 5 5  0 3 0 3 0 0 5       Past Medical History  Diagnosis Date  . Abdominal hernia   . Hernia of abdominal cavity 2009  . Depression     hosp during pregnancy at behav health    Past Surgical History  Procedure Date  . No past surgeries     Family History  Problem Relation Age of Onset  . Hypertension Mother   . Anesthesia problems Neg Hx   . Hypotension Neg Hx   . Malignant hyperthermia Neg Hx   . Pseudochol deficiency Neg Hx     History  Substance Use Topics  . Smoking status: Never Smoker   . Smokeless tobacco: Not on file  . Alcohol Use: No    Allergies: No Known Allergies  Prescriptions prior to admission  Medication Sig Dispense Refill  . ferrous sulfate 300 (60 FE) MG/5ML syrup Take 5 mLs (300 mg total) by mouth daily.  150 mL  3  . Prenatal Vit-Fe Fumarate-FA 29-1 MG CHEW Chew 1 tablet by mouth daily.  90 each  3    Review of Systems  Constitutional: Negative for fever and chills.  Eyes: Negative for blurred vision.  Respiratory: Negative for shortness of breath.   Cardiovascular: Negative for chest pain.  Gastrointestinal: Positive for nausea and abdominal pain.  Genitourinary: Negative for dysuria.  Skin: Negative for rash.  Neurological: Positive for headaches.   Physical Exam   Blood  pressure 117/56, pulse 102, temperature 98 F (36.7 C), resp. rate 22, height 5' (1.524 m), weight 128 lb 6.4 oz (58.242 kg), last menstrual period 10/23/2010, unknown if currently breastfeeding.  Physical Exam  Constitutional: She appears well-developed and well-nourished. She appears distressed.  HENT:  Head: Normocephalic and atraumatic.  Neck: Normal range of motion.  Cardiovascular: Normal rate, regular rhythm and normal heart sounds.   Respiratory: Effort normal and breath sounds normal.  GI:       Gravid, toco in place. Midline hernia noted superior to umbilicus. Easily reducible. Defect noted to be 2-3 cm. Tender to palpation.  Musculoskeletal: Normal range of motion. She exhibits no edema.  Neurological: She is alert.    MAU Course  Procedures  In pain; concern for incarceration is always a concern. Easily reducible on exam, no physical signs of incarcerated bowel. MRI did show diastasis recti, but prior ultrasound did note ventral hernia. Will order limited abdominal ultrasound to evaluate for any changes and/or incarcerated bowel. FHT reassuring.   US Abdomen Limited  06/08/2011  *RADIOLOGY REPORT*  Clinical Data: None abdominal hernia.  Increased pain.  LIMITED ABDOMINAL ULTRASOUND  Comparison:  02/02/2010  Findings: Ultrasound performed in the area of concern of abdominal pain.  There is a hypoechoic mixed echogenicity mass within the  subcutaneous fat within the anterior abdominal wall.  This measures 3.9 x 5.2 x 0.7 cm.  This has enlarged slightly since prior study when this measured maximally 4.4 cm.  This appears to connect with an anterior abdominal wall defect and most compatible with ventral hernia.  Contents appear represent mesenteric fat.  I see no evidence of herniated bowel.  No real change in the appearance since prior study.  IMPRESSION: Ventral hernia, which appears to contain mesenteric fat.  Slight enlargement since prior study.  Otherwise no significant change.   Original Report Authenticated By: Cyndie Chime, M.D.    Assessment and Plan  41 yo [id number] presenting for 1 day history of abdominal pain. - Abd ultrasound to evaluate hernia shows ventral hernia containing fat. There is a previously documented note in Epic that states she will need surgery postpartum. Hernia without significant change at this time. - Will treat pain with Percocet while here, which did not take pain away. - Since hernia is unchanged, and no signs of incarcerated bowel, no indication for admission at this time.  - Plan discussed with Dr. Adrian Blackwater who agrees with plan. - Patient discharged home in stable medical condition. She will follow up as previously scheduled. Given Rx for Percocet #30/0R. If she has increased pain, or has signs of labor, she will return to the MAU to be evaluated.  Esma Kilts 06/08/2011, 2:07 AM

## 2011-06-08 NOTE — Progress Notes (Signed)
Has abdominal hernia which has been hurting since yesterday. Worse today and can't get comfortable. Sharp pain. Also has h/a

## 2011-06-08 NOTE — ED Notes (Signed)
Dr Hairford in to see pt.  

## 2011-06-08 NOTE — ED Notes (Signed)
Dr Mikel Cella in discussing u/s results and d/c plan with pt.

## 2011-06-19 ENCOUNTER — Ambulatory Visit (INDEPENDENT_AMBULATORY_CARE_PROVIDER_SITE_OTHER): Payer: Self-pay | Admitting: Family Medicine

## 2011-06-19 DIAGNOSIS — O358XX0 Maternal care for other (suspected) fetal abnormality and damage, not applicable or unspecified: Secondary | ICD-10-CM

## 2011-06-19 DIAGNOSIS — O09529 Supervision of elderly multigravida, unspecified trimester: Secondary | ICD-10-CM

## 2011-06-19 LAB — POCT URINALYSIS DIP (DEVICE)
Nitrite: NEGATIVE
Protein, ur: NEGATIVE mg/dL
Urobilinogen, UA: 0.2 mg/dL (ref 0.0–1.0)
pH: 7 (ref 5.0–8.0)

## 2011-06-19 NOTE — Progress Notes (Signed)
Chart review done.  Agree with resident note.   

## 2011-06-19 NOTE — Progress Notes (Signed)
Patient seen with interpreter. Patient went to MAU for hernia; had ultrasound that showed ventral hernia. Feeling sharp pains in left flank and cramps in legs, but it has improved. States she does feel somewhat down. Not taking any medications other than pain medication for hernia. Psychologist told her to take her medications right now; goes to [facility] and has follow-up scheduled. No SI/HI at this time.  Irregular contractions, no vaginal bleeding, no gush of fluid. Some vaginal discharge. Feeling baby move.  Pediatrician: Unsure Plans to bottle feed Wants to IUD for pp contraception  PE: General: Alert, flat affect. Abd: Gravid,  3cm hernia noted in epigastric region Psych: No SI/HI. Very flat, but did smile when fetal heart tones were heard.  Patient will return in 2 weeks. All questions answered with interpreter in the room. If she has any concerns, she will go to MAU for evaluation. Hernia pain is controlled with percocet. Continue prenatal vitamin.

## 2011-06-19 NOTE — Progress Notes (Signed)
Pulse 85. Edema hands and feet. Pelvic pressure. No vaginal discharge.

## 2011-07-03 ENCOUNTER — Ambulatory Visit (INDEPENDENT_AMBULATORY_CARE_PROVIDER_SITE_OTHER): Payer: Self-pay | Admitting: Physician Assistant

## 2011-07-03 VITALS — BP 87/55 | Temp 97.3°F | Wt 131.1 lb

## 2011-07-03 DIAGNOSIS — D649 Anemia, unspecified: Secondary | ICD-10-CM

## 2011-07-03 DIAGNOSIS — O99019 Anemia complicating pregnancy, unspecified trimester: Secondary | ICD-10-CM

## 2011-07-03 LAB — POCT URINALYSIS DIP (DEVICE)
Nitrite: NEGATIVE
Protein, ur: NEGATIVE mg/dL
pH: 7 (ref 5.0–8.0)

## 2011-07-03 NOTE — Progress Notes (Addendum)
SW contacted by clinic staff to meet with patient for current depressive symptoms.  SW has met with this patient at least two previous times; after delivery of her last baby, and during an inpatient stay with this pregnancy.  Patient continues to state that she is desperate and depressed.  She states that her children cause "choas" in the house and she worries that she cannot care for another baby and has made no preparations for this baby.  This is consistent with her complaints every other time SW has seen her.  She states that she missed her last appointment with the psychiatrist at the [facility] because she did not have transportation.  She states that her boyfriend usually takes her, but he was unable to that day.  SW asked if she could reschedule the appointment for a day that he or another family member is available.  She said that she will.  SW asked if she has seen a Veterinary surgeon and she states that she did not follow up with calling the counselor.  SW encouraged her to do so.  SW asked if she has been taking her medication, which has been an issue in the past.  She said she is not taking it at this time, but plans to restart after the baby is born.  SW discussed her natural supports and the need to ask for help when she feels desperate.  She has had support people with her in the past who have been very willing to help out.  Patient states she has not asked for help.  SW encouraged her to do so.  We also discussed coping mechanisms and the power of positive thinking.  SW asked MOB if she wishes to parent this baby or if she would like to discuss the option of adoption.  She states that she does not want to discuss this at this time.  SW gave patient contact information for [facility] [facility] and [place] [facility].  Patient was tearful at times, but seemed to be eased by our conversation.  SW recapped plan with patient, which is for her to take deep breaths and time outs when  she is feeling overwhelmed, to ask her support people for help, to contact [facility] [employer] [facility] for support and linking with resources, to contact [employer] for assistance with a car seat and possible preschool program for her three year old and to reschedule appointments at the [facility].  Patient agreed.  SW asked patient if she is feeling suicidal or homicidal at this time and she denies these thoughts.

## 2011-07-03 NOTE — Progress Notes (Signed)
Pulse: 87  Has pain starting in her back and radiating to the front. Feels a lot of pressure in her vagina. She is having a white sticky discharge that is causing itching.

## 2011-07-03 NOTE — Progress Notes (Signed)
Patient seen with interpreter. Patient c/o low back pain that radiates to her pelvic and vaginal region for 1 week.  She describes the pain as constant similar to contractions with a rating of 5-6/10.  She also has increased urination frequency, urgency, burning with urination, and whitish vaginal discharge for about 3 days.  Last night she had one episode of sharp sternal chest pain that radiated through to her back with some SOB.  She has had heartburn but denies congestion or fever.  She missed her last follow up with [facility] for depression due to transportation issues.  She has not been taking Zoloft or Zyprexa which was prescribed for her at this clinic.  She states she feels desperate wanting to cry all the time.   Patient crying with sad mood. ABD - + epigastric tenderness, abdominal hernia noted; Cardio - RRR, no rubs, murmurs, or gallops noted; Lungs - CTAB, no rales, wheezes or rhonchi noted; Cervix-1cm, thick, vertex; Fundus - 35cm; FHT - 143.  Patient to consult with SW today and will schedule f/u with [facility].  Culture Urine. Collected GBS culture.

## 2011-07-03 NOTE — Progress Notes (Signed)
I have seen/examined this patient and agree with the student's assessment and plan. 

## 2011-07-03 NOTE — Patient Instructions (Signed)
Depresin (Depression) Usted presenta signos de depresin. Es un trastorno que puede ocurrir a Actuary. Con frecuencia es difcil de Public house manager. Neomia Dear persona puede estar deprimida y adems tener momentos de Archivist. La depresin interfiere en la capacidad bsica para funcionar en la vida diaria. Obstaculiza tanto las [employer] hbitos de sueo, alimentacin y [place]. CAUSAS Se cree que la causa es un desequilibrio en las sustancias qumicas del cerebro. El origen puede ser un hecho displacentero. La crisis en una relacin, la muerte de un familiar, preocupaciones econmicas, la jubilacin y otros factores estresantes son causas normales de depresin. Tambin puede comenzar sin causa aparente. Otros factores que pueden tener incidencia: algunas enfermedades, algunos medicamentos, factores genticos, y consumo excesivo de alcohol o drogas. SNTOMAS  Sentimiento de desdicha o desvalorizacin.   Cansancio crnico o sensacin de agotamiento.   Pensamientos y acciones de autodestruccin.   Dificultad para dormir o dormir demasiado.   Comer ms de lo habitual o no alimentarse en absoluto.   Cefaleas o ansiedad.   Dificultad para concentrase o tomar decisiones.   Sntomas fsicos sin causa y consumo de drogas.  TRATAMIENTO Generalmente mejora si se realiza Pharmacist, community. Entre ellos se incluyen:  Medicamentos antidepresivos. Puede demorara algunas semanas antes de llegar a la dosis [place] y a los beneficios.   Converse con un terapeuta, ministro, consejero o amigo. Estas personas pueden ayudarlo a comprender su problema y a controlar nuevamente sus actos.   Consuma una dieta saludable.   Ralice actividad fisica de [place] regular, como caminar durante 30 minutos [address].   No consuma alcohol ni drogas.  El tratamiento de la depresin puede llevar 6 meses o ms. El tratamiento debe mantenerse para evitar que los sntomas vuelvan a Research officer, trade union. Asegrese de  comunicarse con el profesional que lo asiste y Surveyor, mining una entrevista de control, como se lo ha sugerido el equipo que lo ha [place]. SOLICITE ATENCIN MDICA DE INMEDIATO SI:  Comienza a tener pensamientos acerca de lastimarse o daar a Economist.   Comunquese con el servicio de emergencias de su localidad (911 en los [place]).   Concurra al servicio de emergencias mdicas de su localidad.   Comunquese con la Lnea Telefnica Nacional para la Prevencin del Suicidio (National Suicide Prevention Lifeline ) al 1-800-273-TALK [phone]).  Document Released: 04/01/2005 Document Revised: 03/21/2011 [facility] Patient Information 2012 [place], [place].

## 2011-07-10 ENCOUNTER — Ambulatory Visit: Payer: Self-pay | Admitting: Obstetrics and Gynecology

## 2011-07-10 ENCOUNTER — Telehealth (HOSPITAL_COMMUNITY): Payer: Self-pay | Admitting: *Deleted

## 2011-07-10 ENCOUNTER — Encounter (HOSPITAL_COMMUNITY): Payer: Self-pay | Admitting: *Deleted

## 2011-07-10 ENCOUNTER — Encounter: Payer: Self-pay | Admitting: Obstetrics and Gynecology

## 2011-07-10 LAB — POCT URINALYSIS DIP (DEVICE)
Ketones, ur: NEGATIVE mg/dL
Nitrite: NEGATIVE
Protein, ur: 30 mg/dL — AB
pH: 7 (ref 5.0–8.0)

## 2011-07-10 NOTE — Progress Notes (Signed)
Pulse- 95  Pain- severe lower back pain, pelvic Pt is not taking any meds for her depression had an appt with psychiatrist On March 14th @ 11am in which did not go to appt due to not having a ride.   Pt c/o "feeling very overwhelmed and depressed" pt continues to cry throughout triage

## 2011-07-10 NOTE — Patient Instructions (Signed)
Embarazo - Tercer trimestre (Pregnancy - Third Trimester) El tercer trimestre del embarazo (los ltimos 3 meses) es el perodo de cambios ms rpidos que atraviesan usted y el beb. El aumento de peso es ms rpido. El beb alcanza un largo de aproximadamente 50 cm (20 pulgadas) y pesa entre 2,700 y 4,500 kg (6 a 10 libras). El beb gana ms tejido graso y ya est listo para la vida fuera del cuerpo de la madre. Mientras estn en el interior, los bebs tienen perodos de sueo y vigilia, succionan el pulgar y tienen hipo. Quizs sienta pequeas contracciones del tero. Este es el falso trabajo de parto. Tambin se las conoce como contracciones de Braxton-Hicks. Es como una prctica del parto. Los problemas ms habituales de esta etapa del embarazo incluyen mayor dificultad para respirar, hinchazn de las manos y los pies por retencin de lquidos y la necesidad de orinar con ms frecuencia debido a que el tero y el beb presionan sobre la vejiga.  EXAMENES PRENATALES  Durante los exmenes prenatales, deber seguir realizando pruebas de sangre, segn avance el embarazo. Estas pruebas se realizan para controlar su salud y la del beb. Tambin se realizan anlisis de sangre para conocer los niveles de hemoglobina. La anemia (bajo nivel de hemoglobina) es frecuente durante el embarazo. Para prevenirla, se administran hierro y vitaminas. Tambin le harn nuevas pruebas para descartar la diabetes. Podrn repetirle algunas de las pruebas que le hicieron previamente.   En cada visita le medirn el tamao del tero. Es para asegurarse de que el beb se desarrolla correctamente.   Tambin en cada visita la pesarn. Esto se realiza para asegurarse de que aumenta de peso al ritmo indicado y que usted y su beb evolucionan normalmente.   En algunas ocasiones se realiza una ecografa para confirmar el correcto desarrollo y evolucin del beb. Esta prueba se realiza con ondas sonoras inofensivas para el beb, de modo  que el profesional pueda calcular con ms precisin la fecha del parto.   Discuta las posibilidades de la anestesia si necesita cesrea.  Algunas veces se realizan pruebas especializadas del lquido amnitico que rodea al beb. Esta prueba se denomina amniocentesis. El lquido amnitico se obtiene introduciendo una aguja en el abdomen (vientre). En ocasiones se lleva a cabo cerca del final del embarazo, si es necesario adelantar el parto. En este caso se realiza para asegurarse de que los pulmones del beb estn lo suficientemente maduros como para que pueda vivir fuera del tero. CAMBIOS QUE OCURREN EN EL TERCER TRIMESTRE DEL EMBARAZO Su organismo atravesar diferentes cambios durante el embarazo que varan de una persona a otra. Converse con el profesional que la asiste acerca los cambios que usted note y que la preocupen.  Durante el ltimo trimestre probablemente sienta un aumento del apetito. Es normal tener "antojos" de ciertas comidas. Esto vara de una persona a otra y de un embarazo a otro.   Podrn aparecer las primeras estras en las caderas, abdomen y mamas. Estos son cambios normales del cuerpo durante el embarazo. No existen medicamentos ni ejercicios que puedan prevenir estos cambios.   El estreimiento puede tratarse con un laxante o agregando fibra a su dieta. Beber grandes cantidades de lquidos, tomar fibras en forma de verduras, frutas y granos integrales es de gran ayuda.   Tambin es beneficioso practicar actividad fsica. Si ha sido una persona activa hasta el embarazo, podr continuar con la mayora de las actividades durante el mismo. Si ha sido menos activa, puede ser beneficioso   que comience con un programa de ejercicios, como realizar caminatas. Consulte con el profesional que la asiste antes de comenzar un programa de ejercicios.   Evite el consumo de cigarrillos, el alcohol, los medicamentos no prescritos y las "drogas de la calle" durante el embarazo. Estas sustancias  qumicas afectan la formacin y el desarrollo del beb. Evite estas sustancias durante todo el embarazo para asegurar el nacimiento de un beb sano.   Dolor de espalda, venas varicosas y hemorroides podran aparecer o empeorar.   Los movimientos del beb pueden ser ms bruscos y aparecer ms a menudo.   Puede que note dificultades para respirar facilmente.   El ombligo podra salrsele hacia afuera.   Puede segregar un lquido amarillento (calostro) de las mamas.   Puede segregar mucus con sangre. Esto normalmente ocurre unos pocos das a una semana antes de que comience el trabajo de parto.  INSTRUCCIONES PARA EL CUIDADO DOMICILIARIO  La mayor parte de los cuidados que se aconsejan son los mismos que los indicados para las primeras etapas del embarazo. Es importante que concurra a todas las citas con el profesional y siga sus instrucciones con respecto a los medicamentos que deba utilizar, a la actividad fsica y a la dieta.   Durante el embarazo debe obtener nutrientes para usted y para su beb. Consuma alimentos balanceados a intervalos regulares. Elija alimentos como carne, pescado, leche y otros productos lcteos descremados, verduras, frutas, panes integrales y cereales. El profesional le informar cul es el aumento de peso ideal.   Las relaciones sexuales pueden continuarse hasta casi el final del embarazo, si no se presentan otros problemas como prdida prematura (antes de tiempo) de lquido amnitico, hemorragia vaginal o dolor abdominal (en el vientre).   Realice actividad fsica todos los das, si no tiene restricciones. Consulte con el profesional que la asiste si no sabe con certeza si determinados ejercicios son seguros. El mayor aumento de peso se produce en los dos ltimos trimestres del embarazo.   Haga reposo con frecuencia, con las piernas elevadas, o segn lo necesite para evitar los calambres y el dolor de cintura.   Use un buen sostn o como los que se usan para hacer  deportes para aliviar la sensibilidad de las mamas. Tambin puede serle til si lo usa mientras duerme. Si pierde calostro, podr utilizar apsitos en el sostn.   No utilice la baera con agua caliente, baos turcos y saunas.   Colquese el cinturn de seguridad cuando conduzca. Este la proteger a usted y al beb en caso de accidente.   Evite comer carne cruda y el contacto con los utensilios y desperdicios de los gatos. Estos elementos contienen grmenes que pueden causar defectos de nacimiento en el beb.   Es fcil perder algo de orina durante el embarazo. Apretar y fortalecer los msculos de la pelvis la ayudar con este problema. Practique detener la miccin cuando est en el bao. Estos son los mismos msculos que necesita fortalecer. Son tambin los mismos msculos que utiliza cuando trata de evitar los gases. Puede practicar apretando estos msculos diez veces, y repetir esto tres veces por da aproximadamente. Una vez que conozca qu msculos debe contraer, no realice estos ejercicios durante la miccin. Puede favorecerle una infeccin si la orina vuelve hacia atrs.   Pida ayuda si tiene necesidades econmicas, de asesoramiento o nutricionales durante el embarazo. El profesional podr ayudarla con respecto a estas necesidades, o derivarla a otros especialistas.   Practique la ida hasta el hospital a modo   de prueba.   Tome clases prenatales junto con su pareja para comprender, practicar y hacer preguntas acerca del trabajo de parto y el nacimiento.   Prepare la habitacin del beb.   No viaje fuera de la ciudad a menos que sea absolutamente necesario y con el consejo del mdico.   Use slo zapatos bajos sin taco para tener un mejor equilibrio y prevenir cadas.  EL CONSUMO DE MEDICAMENTOS Y DROGAS DURANTE EL EMBARAZO  Contine tomando las vitaminas apropiadas para esta etapa tal como se le indic. Las vitaminas deben contener un miligramo de cido flico y deben suplementarse con  hierro. Guarde todas las vitaminas fuera del alcance de los nios. La ingestin de slo un par de vitaminas o comprimidos que contengan hierro pueden ocasionar la muerte en un beb o en un nio pequeo.   Evite el uso de medicamentos, inclusive los de venta libre, que no hayan sido prescritos o indicados por el profesional que la asiste. Algunos medicamentos pueden causar problemas fsicos al beb. Utilice los medicamentos de venta libre o de prescripcin para el dolor, el malestar o la fiebre, segn se lo indique el profesional que lo asiste. No utilice aspirina, ibuprofeno (Motrin, Advil, Nuprin) o naproxeno (Aleve) a menos que el profesional la autorice.   El alcohol se asocia a cierto nmero de defectos del nacimiento, incluido el sndrome de alcoholismo fetal. Debe evitar el consumo de alcohol en cualquiera de sus formas. El cigarrillo causa nacimientos prematuros y bebs de bajo peso al nacer. Las drogas de la calle son muy nocivas para el beb y estn absolutamente prohibidas. Un beb que nace de una madre adicta, ser adicto al nacer. Ese beb tendr los mismos sntomas de abstinencia que un adulto.   Infrmele al profesional si consume alguna droga.  SOLICITE ATENCIN MDICA SI: Tiene alguna preocupacin durante el embarazo. Es mejor que llame para formular las preguntas si no puede esperar hasta la prxima visita, que sentirse preocupada por ellas.  DECISIONES ACERCA DE LA CIRCUNCISIN Usted puede saber o no cul es el sexo de su beb. Si es un varn, ste es el momento de pensar acerca de la circuncisin. La circuncisin es la extirpacin del prepucio. Esta es la piel que cubre el extremo sensible del pene. No hay un motivo mdico que lo justifique. Generalmente la decisin se toma segn lo que sea popular en ese momento, o se basa en creencias religiosas. Podr conversar estos temas con el profesional que la asiste. SOLICITE ATENCIN MDICA DE INMEDIATO SI:  La temperatura oral se eleva  sin motivo por encima de 102 F (38.9 C) o segn le indique el profesional que la asiste.   Tiene una prdida de lquido por la vagina (canal de parto). Si sospecha una ruptura de las membranas, tmese la temperatura y llame al profesional para informarlo sobre esto.   Observa unas pequeas manchas, una hemorragia vaginal o elimina cogulos. Avsele al profesional acerca de la cantidad y de cuntos apsitos est utilizando.   Presenta un olor desagradable en la secrecin vaginal y observa un cambio en el color, de transparente a blanco.   Ha vomitado durante ms de 24 horas.   Presenta escalofros o fiebre.   Comienza a sentir falta de aire.   Siente ardor al orinar.   Baja o sube ms de 900 g (ms de 2 libras), o segn lo indicado por el profesional que la asiste. Observa que sbitamente se le hinchan el rostro, las manos, los pies o las   piernas.   Presenta dolor abdominal. Las molestias en el ligamento redondo son una causa benigna (no cancerosa) frecuente de dolor abdominal durante el embarazo, pero el profesional que la asiste deber evaluarlo.   Presenta dolor de cabeza intenso que no se alivia.   Si no siente los movimientos del beb durante ms de tres horas. Si piensa que el beb no se mueve tanto como lo haca habitualmente, coma algo que contenga azcar y recustese sobre el lado izquierdo durante una hora. El beb debe moverse al menos 4  5 veces por hora. Comunquese inmediatamente si el beb se mueve menos que lo indicado.   Se cae, se ve involucrada en un accidente automovilstico o sufre algn tipo de traumatismo.   En su hogar hay violencia mental o fsica.  Document Released: 01/09/2005 Document Revised: 03/21/2011 [employer] Patient Information 2012 [employer]. 

## 2011-07-10 NOTE — Telephone Encounter (Signed)
Preadmission screen Translator [phone]

## 2011-07-10 NOTE — Progress Notes (Signed)
Lots of abd and low back discomfort with walking  and moving. VE: No PP palpable and transverse by abd exam. [place] by Diane confirms, back anterior and head to left. C/W [name] -> schedule version tomorrow am. Appt made for psych visit this afternoon (see SW note)

## 2011-07-10 NOTE — Progress Notes (Signed)
External cephalic version scheduled 07/11/11 @ 0800. Pt instructed to be NPO after midnight- voiced understanding. Interpreter present.

## 2011-07-11 ENCOUNTER — Observation Stay (HOSPITAL_COMMUNITY)
Admission: RE | Admit: 2011-07-11 | Discharge: 2011-07-11 | Disposition: A | Discharge status: Home / Self Care | Payer: Self-pay | Source: Ambulatory Visit | Attending: Family Medicine | Admitting: Family Medicine

## 2011-07-11 ENCOUNTER — Encounter (HOSPITAL_COMMUNITY): Payer: Self-pay

## 2011-07-11 VITALS — BP 96/54 | HR 107 | Temp 98.4°F | Resp 20 | Ht 60.0 in | Wt 132.0 lb

## 2011-07-11 DIAGNOSIS — F430 Acute stress reaction: Secondary | ICD-10-CM

## 2011-07-11 DIAGNOSIS — O322XX0 Maternal care for transverse and oblique lie, not applicable or unspecified: Principal | ICD-10-CM

## 2011-07-11 DIAGNOSIS — F339 Major depressive disorder, recurrent, unspecified: Secondary | ICD-10-CM

## 2011-07-11 MED ORDER — TERBUTALINE SULFATE 1 MG/ML IJ SOLN
0.2500 mg | Freq: Once | INTRAMUSCULAR | Status: AC
  Administered 2011-07-11: 0.25 mg via SUBCUTANEOUS

## 2011-07-11 MED ORDER — TERBUTALINE SULFATE 1 MG/ML IJ SOLN
INTRAMUSCULAR | Status: AC
  Filled 2011-07-11: qty 1

## 2011-07-11 MED ORDER — LACTATED RINGERS IV SOLN
INTRAVENOUS | Status: DC
  Administered 2011-07-11: 10:00:00 via INTRAVENOUS

## 2011-07-11 MED ORDER — ACETAMINOPHEN 325 MG PO TABS
650.0000 mg | ORAL_TABLET | Freq: Four times a day (QID) | ORAL | Status: DC | PRN
  Administered 2011-07-11: 650 mg via ORAL
  Filled 2011-07-11: qty 2

## 2011-07-11 NOTE — Discharge Instructions (Signed)
Ideas de suicidio, Como ayudarse usted mismo (Suicidal Feelings, How to Help Yourself) La idea de suicidio es un sentimiento de desesperanza que parece ser demasiado intenso como para superarlo. Es como estar frente a una montaa demasiado alta y no tener la suficiente capacidad para escalarla. Recuerde, estos sentimientos son temporarios! Pronto desaparecern. Si siente que ha llegado a un punto en el que el suicidio parece ser la nica [place], es el momento de pedir [place]. Algunos de los pasos siguientes lo guiarn en la direccin correcta. COMO HACER FRENTE A LA IDEA DE SUICIDIO Y COMO PREVENIRLO  Hable con su familia, amigos, maestros o terapeutas. Busque ayuda. Trate de no alejarse de [employer] se preocupan por usted. Aunque no sienta deseos de [address] acompaado o piense que usted no es [place] buena compaa, ellos querrn [place].   Consuma regularmente una dieta balanceada y descanse lo suficiente.   Evite medicamentos y depresores como el alcohol, que pueden disminuir sus inhibiciones. Retrelos de Pensions consultant. Si est tomando antidepresivos, hable con su terapeuta acerca de sus sentimientos de modo que pueda administrarle medicamentos ms seguros, en caso de que puedan transformarse en un problema.   Retire las armas o las sustancias venenosas de su casa.   Trate de cumplir con sus rutinas. Estas pueden ser desde llevar a pasear al perro o alimentar al gato, pero siempre siguiendo un horario y recordando usted mismo que debe cumplirlo [employer]. Juegue con sus mascotas. Si no tiene [employer], en lo posible trate de [employer]. Le dar una sensacin de bienestar, disminuir su presin arterial y har que su corazn est mejor. Su mascota lo Pension scheme manager, y todos deseamos ser tiles.   Establezca algunos objetivos factibles de cumplir y trate de [place]. Haga una lista y tache las cosas con las que ya cumpli. Los logros [employer].   En lo posible, trate de  comenzar con un programa de ejercicios. An media hora de ejercicios por da lo harn sentirse mejor y lo ayudarn a recuperarse de la depresin. Si tiene Administrator preferido para caminar, aprovchelo.   Aumente aquellas actividades seguras que siempre le han dado Immunologist. Puede ser tocar su msica preferida, leer un buen libro, pintar un cuadro o ejecutar su instrumento favorito. Haga todo lo posible para quitar de su mente la depresin y para tratar de Horticulturist, commercial.   Mantenga su lugar bien iluminado, con las ventanas abiertas y deje entrar el sol. La luz brillante puede definitivamente tratar la depresin, no slo en aquellas personas que sufren un trastorno Programmer, applications.  Pero sobre todo recuerde, la depresin es temporaria. Pronto desaparecer. No piense en el suicidio. Una solucin permanente no es la [place]. El suicidio le quitar el tiempo maravilloso que le queda de vida y ser un dao que perdurar en aquellos que lo rodean y que lo [place]. LA AYUDA EST A SU ALCANCE. Las lneas telefnicas de ayuda al suicida las 24 horas son: 1-800-SUICIDE [phone] Document Released: 01/16/2006 Document Revised: 03/21/2011 [facility] Patient Information 2012 [place], [place].

## 2011-07-11 NOTE — Progress Notes (Signed)
Pt. Reports to the nurse that she is overwhelmed and anxious.  States she is worried about harming her children.  Especially, her 39 year old whom she reports to be very disobedient.  Nursing and SW will institute CPS referral and we have ordered a Psych consult.

## 2011-07-11 NOTE — Progress Notes (Signed)
[address] and Francesca Jewett SW notified of pt feeling some hopelessness and reporting desire to harm 3 year year and feeling overwhelmed with having 2 children now.  Requests to have OB have call Psch MD evaluate pt as soon as possible.

## 2011-07-11 NOTE — H&P (Signed)
Bonnie Conrad is a 41 y.o. female presenting for ECV. Maternal Medical History:  Reason for admission: Reason for Admission:   nauseaTransverse lie  Contractions: Frequency: irregular.   Perceived severity is mild.    Fetal activity: Perceived fetal activity is normal.    Prenatal complications: No bleeding.   Behavioral medicine admission, AMA    OB History    Grav Para Term Preterm Abortions TAB SAB Ect Mult Living   9 5 5  0 3 0 3 0 0 5     Past Medical History  Diagnosis Date  . Abdominal hernia   . Hernia of abdominal cavity 2009  . Depression     hosp during pregnancy at behav health   Past Surgical History  Procedure Date  . No past surgeries    Family History: family history includes Hypertension in her mother.  There is no history of Anesthesia problems, and Hypotension, and Malignant hyperthermia, and Pseudochol deficiency, . Social History:  reports that she has never smoked. She has never used smokeless tobacco. She reports that she does not drink alcohol or use illicit drugs.  Review of Systems  Constitutional: Negative for fever.  Respiratory: Negative for shortness of breath.   Gastrointestinal: Positive for abdominal pain. Negative for nausea and vomiting.  Genitourinary: Negative for dysuria.  Neurological: Positive for headaches.      Blood pressure 112/75, temperature 98.2 F (36.8 C), temperature source Oral, resp. rate 20, height 5' (1.524 m), weight 59.875 kg (132 lb), last menstrual period 10/23/2010, unknown if currently breastfeeding. Maternal Exam:  Uterine Assessment: Contraction strength is mild.  Contraction frequency is irregular.   Abdomen: Fundal height is equals dates.    Introitus: Normal vulva.   Fetal Exam Fetal Monitor Review: Baseline rate: 140.  Variability: moderate (6-25 bpm).   Pattern: accelerations present and no decelerations.    Fetal State Assessment: Category I - tracings are normal.     Physical  Exam  Vitals reviewed. Constitutional: She appears well-developed and well-nourished.  HENT:  Head: Normocephalic and atraumatic.  Neck: Normal range of motion. Neck supple.  Cardiovascular: Normal rate and regular rhythm.   Respiratory: Effort normal.  GI: Soft. There is no tenderness.  Musculoskeletal: Normal range of motion.  Skin: Skin is dry.    Prenatal labs: ABO, Rh: O/POS/-- (11/21 1112) Antibody: NEG (11/21 1112) Rubella: 106.6 (11/21 1112) RPR: NON REAC (01/16 1016)  HBsAg: NEGATIVE (11/21 1112)  HIV: NON REACTIVE (11/21 1112)  GBS: Negative (03/27 0000)   Assessment/Plan: Transverse lie--Attempt at ECV.  Bonnie Conrad 07/11/2011, 9:21 AM
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
## 2011-07-11 NOTE — Procedures (Signed)
After informed verbal consent, Terbutaline 0.25 mg SQ given, ECV was attempted under Ultrasound guidance.  Infant was back down transverse and easily turned on second attempt with minimal effort.   FHR was reactive before and after the procedure.   Pt. Tolerated the procedure well.

## 2011-07-11 NOTE — Progress Notes (Signed)
[id number] Dr Shawnie Pons notified w/ Eda, intepreter, that pt reports feelings of "a little" hopelessness and desire to harm 41 year old. Requests SW to be called.

## 2011-07-11 NOTE — Progress Notes (Signed)
Discussion conducted with interpreter present.   Went to discuss plan of care with patient.  The FOB and one of her daughters are now present.  When asked if she wants to go to behavioral health, patient states she does not really want to go because there will not be anyone to care for her children.  She says she would be willing to go after the baby comes.  I asked her multiple times and multiple ways about thoughts of hurting herself, her unborn child, or her older children, she denies any thoughts.  I asked her why behavioral health thought she should be admitted, and she dodged the question several times.  Finally she says that yesterday she was feeling very depressed yesterday because her children are causing her a lot of stress.  She had an appointment here, and then went to see her psychiatrist, who gave her something to help her relax and now she feels better.   I discussed the case with [name], and my concerns that the patient has changed her story now that family members are present.  [name] spoke with the person from [facility] who evaluated her, as there is still no note from them, so it is unclear to [place] if the patient has been deemed unsafe to go home and this is an involuntary commitment or if this is a voluntary stay at behavioral health.  Behavioral health states over the phone that patient does not meet criteria for involuntary commitment.  However, as they discussed with her, a CPS case has been opened due to her statements yesterday, and voluntarily going to behavioral health would help her in her CPS case.   I went and spoke with patient again, explained what CPS is and their purpose, and that they do have the ability to take children from the home if they deem parents/home environment unsafe for children.  I explained that going to [facility] would go a long way to show she is trying to get better from her depression and help the CPS case.  She still states she would like to go home.     Gemini Bunte 07/11/2011 5:42 PM.

## 2011-07-12 NOTE — Discharge Summary (Signed)
Obstetric Discharge Summary Reason for Admission: Transverse fetal lie Prenatal Procedures: ultrasound Procedures This admission: External cephalic version, successful Complications-Operative: none Hemoglobin  Date Value Range Status  05/01/2011 8.8* 12.0-15.0 (g/dL) Final     HCT  Date Value Range Status  05/01/2011 27.4* 36.0-46.0 (%) Final    Consults:  Social Work Psychiatry  Brief Hospital Course: Patient was admitted for transverse fetal lie, and a successful external cephalic version was performed.  Patient was monitored after version per routine.  During this time, she told staff and physicians that she was extremely depressed and anxious and was having thoughts of hurting her children due to the stress.  A Psychiatry consult and social work consult was called.  Social work spoke with patient and called CPS.  Psychiatry evaluated the patient.  [name] spoke with Psychiatry, who felt patient was a candidate for voluntary admission to behavioral health.  However, the patient did not want to go to [facility]. Psychiatry did not feel patient met criteria for involuntary commitment.  She was discharged home with close Psych and OB follow up.   Discharge Diagnoses: S/P Cephalic Version, now vertex presentation Depression/Anxiety NOS  Discharge Information: Date: 07/12/2011 Activity: unrestricted Diet: routine Medications: Benadryl for anxiety per patient's psychiatrist Condition: stable Instructions: Labor precautions, Suicide precautions/contract for safety Discharge to: home   [facility] 07/12/2011, 4:32 PM

## 2011-07-17 ENCOUNTER — Ambulatory Visit (INDEPENDENT_AMBULATORY_CARE_PROVIDER_SITE_OTHER): Payer: Self-pay | Admitting: Physician Assistant

## 2011-07-17 VITALS — BP 93/55 | Temp 97.9°F | Wt 134.1 lb

## 2011-07-17 DIAGNOSIS — O322XX0 Maternal care for transverse and oblique lie, not applicable or unspecified: Secondary | ICD-10-CM

## 2011-07-17 DIAGNOSIS — D649 Anemia, unspecified: Secondary | ICD-10-CM

## 2011-07-17 DIAGNOSIS — O99019 Anemia complicating pregnancy, unspecified trimester: Secondary | ICD-10-CM

## 2011-07-17 LAB — POCT URINALYSIS DIP (DEVICE)
Ketones, ur: NEGATIVE mg/dL
Protein, ur: NEGATIVE mg/dL
Specific Gravity, Urine: 1.015 (ref 1.005–1.030)
pH: 7 (ref 5.0–8.0)

## 2011-07-17 NOTE — Progress Notes (Signed)
Pulse- 96  Pressure- vaginal Interpreter # [phone]

## 2011-07-17 NOTE — Progress Notes (Signed)
Interpreter present Here for f/u.  No complaints today.  Had successful version on 07/11/11 by [name], feels like baby moved back to transverse lie.  Good fetal movement.   Denies bleeding, lof.  Has irregular contractions  O: On examination fetus feels transverse with head on R side.  Confirmed by ultrasound.  Uterus feels large and boggy, and fetus seems to move pretty freely throughout  A/P 1. Transverse lie  -Spoke with Dr. Debroah Loop who examined patient and felt like if she did have a repeat version that she may return to transverse lie again.  Advised waiting until next week to see if baby becomes cephalic, can consider version again and induction after 39 weeks.  Explained risk of C-Section if baby unable to remain cephalic.  -Continue routine care, f/u 1 week -Labor precautions discussed.

## 2011-07-17 NOTE — Progress Notes (Signed)
Unstable lie, transverse head right-oblique cephalic. Version was very easy per [name]. Recommend RTC 1 week and check presentation and decide on possible version at 39 weeks and induction

## 2011-07-24 ENCOUNTER — Inpatient Hospital Stay (HOSPITAL_COMMUNITY): Payer: Medicaid Other | Admitting: Anesthesiology

## 2011-07-24 ENCOUNTER — Encounter (HOSPITAL_COMMUNITY): Payer: Self-pay | Admitting: Anesthesiology

## 2011-07-24 ENCOUNTER — Inpatient Hospital Stay (HOSPITAL_COMMUNITY)
Admission: AD | Admit: 2011-07-24 | Discharge: 2011-07-27 | DRG: 775 | Disposition: A | Discharge status: Home / Self Care | Payer: Medicaid Other | Source: Ambulatory Visit | Attending: Family Medicine | Admitting: Family Medicine

## 2011-07-24 ENCOUNTER — Ambulatory Visit (INDEPENDENT_AMBULATORY_CARE_PROVIDER_SITE_OTHER): Payer: Self-pay | Admitting: Advanced Practice Midwife

## 2011-07-24 ENCOUNTER — Encounter (HOSPITAL_COMMUNITY): Payer: Self-pay | Admitting: *Deleted

## 2011-07-24 VITALS — BP 100/67 | Temp 97.6°F | Wt 133.9 lb

## 2011-07-24 DIAGNOSIS — O322XX0 Maternal care for transverse and oblique lie, not applicable or unspecified: Secondary | ICD-10-CM

## 2011-07-24 DIAGNOSIS — O09529 Supervision of elderly multigravida, unspecified trimester: Secondary | ICD-10-CM | POA: Diagnosis present

## 2011-07-24 DIAGNOSIS — O0942 Supervision of pregnancy with grand multiparity, second trimester: Secondary | ICD-10-CM

## 2011-07-24 DIAGNOSIS — O0940 Supervision of pregnancy with grand multiparity, unspecified trimester: Secondary | ICD-10-CM

## 2011-07-24 LAB — POCT URINALYSIS DIP (DEVICE)
Glucose, UA: NEGATIVE mg/dL
Protein, ur: NEGATIVE mg/dL
Specific Gravity, Urine: 1.02 (ref 1.005–1.030)
Urobilinogen, UA: 0.2 mg/dL (ref 0.0–1.0)

## 2011-07-24 LAB — CBC
MCH: 23.8 pg — ABNORMAL LOW (ref 26.0–34.0)
MCV: 76.5 fL — ABNORMAL LOW (ref 78.0–100.0)
Platelets: 225 10*3/uL (ref 150–400)
RBC: 3.66 MIL/uL — ABNORMAL LOW (ref 3.87–5.11)

## 2011-07-24 MED ORDER — ONDANSETRON HCL 4 MG/2ML IJ SOLN
4.0000 mg | Freq: Four times a day (QID) | INTRAMUSCULAR | Status: DC | PRN
  Administered 2011-07-24: 4 mg via INTRAVENOUS
  Filled 2011-07-24: qty 2

## 2011-07-24 MED ORDER — ACETAMINOPHEN 325 MG PO TABS: 650.0000 mg | ORAL_TABLET | ORAL | Status: DC | PRN

## 2011-07-24 MED ORDER — CITRIC ACID-SODIUM CITRATE 334-500 MG/5ML PO SOLN
30.0000 mL | ORAL | Status: DC | PRN
  Administered 2011-07-24: 30 mL via ORAL
  Filled 2011-07-24: qty 15

## 2011-07-24 MED ORDER — TERBUTALINE SULFATE 1 MG/ML IJ SOLN
INTRAMUSCULAR | Status: AC
  Administered 2011-07-24: 0.25 mg
  Filled 2011-07-24: qty 1

## 2011-07-24 MED ORDER — FENTANYL 2.5 MCG/ML BUPIVACAINE 1/10 % EPIDURAL INFUSION (WH - ANES)
14.0000 mL/h | INTRAMUSCULAR | Status: DC
  Filled 2011-07-24: qty 60

## 2011-07-24 MED ORDER — IBUPROFEN 600 MG PO TABS: 600.0000 mg | ORAL_TABLET | Freq: Four times a day (QID) | ORAL | Status: DC | PRN

## 2011-07-24 MED ORDER — OXYTOCIN 20 UNITS IN LACTATED RINGERS INFUSION - SIMPLE
125.0000 mL/h | Freq: Once | INTRAVENOUS | Status: DC
  Filled 2011-07-24: qty 1000

## 2011-07-24 MED ORDER — TERBUTALINE SULFATE 1 MG/ML IJ SOLN
0.2500 mg | Freq: Once | INTRAMUSCULAR | Status: AC | PRN
  Filled 2011-07-24: qty 1

## 2011-07-24 MED ORDER — FENTANYL 2.5 MCG/ML BUPIVACAINE 1/10 % EPIDURAL INFUSION (WH - ANES)
INTRAMUSCULAR | Status: DC | PRN
  Administered 2011-07-24: 12 mL/h via EPIDURAL

## 2011-07-24 MED ORDER — OXYCODONE-ACETAMINOPHEN 5-325 MG PO TABS: 1.0000 | ORAL_TABLET | ORAL | Status: DC | PRN

## 2011-07-24 MED ORDER — LACTATED RINGERS IV SOLN: 500.0000 mL | Freq: Once | INTRAVENOUS | Status: DC

## 2011-07-24 MED ORDER — DIPHENHYDRAMINE HCL 50 MG/ML IJ SOLN: 12.5000 mg | INTRAMUSCULAR | Status: DC | PRN

## 2011-07-24 MED ORDER — OXYTOCIN BOLUS FROM INFUSION
500.0000 mL | Freq: Once | INTRAVENOUS | Status: DC
  Filled 2011-07-24: qty 500

## 2011-07-24 MED ORDER — OXYTOCIN 20 UNITS IN LACTATED RINGERS INFUSION - SIMPLE
1.0000 m[IU]/min | INTRAVENOUS | Status: DC
  Administered 2011-07-24: 2 m[IU]/min via INTRAVENOUS

## 2011-07-24 MED ORDER — EPHEDRINE 5 MG/ML INJ
10.0000 mg | INTRAVENOUS | Status: DC | PRN
  Filled 2011-07-24: qty 4

## 2011-07-24 MED ORDER — PHENYLEPHRINE 40 MCG/ML (10ML) SYRINGE FOR IV PUSH (FOR BLOOD PRESSURE SUPPORT): 80.0000 ug | PREFILLED_SYRINGE | INTRAVENOUS | Status: DC | PRN

## 2011-07-24 MED ORDER — LACTATED RINGERS IV SOLN: 500.0000 mL | INTRAVENOUS | Status: DC | PRN

## 2011-07-24 MED ORDER — FLEET ENEMA 7-19 GM/118ML RE ENEM: 1.0000 | ENEMA | RECTAL | Status: DC | PRN

## 2011-07-24 MED ORDER — LIDOCAINE HCL (PF) 1 % IJ SOLN: 30.0000 mL | INTRAMUSCULAR | Status: DC | PRN

## 2011-07-24 MED ORDER — LACTATED RINGERS IV SOLN
INTRAVENOUS | Status: DC
  Administered 2011-07-24: 13:00:00 via INTRAVENOUS

## 2011-07-24 MED ORDER — PHENYLEPHRINE 40 MCG/ML (10ML) SYRINGE FOR IV PUSH (FOR BLOOD PRESSURE SUPPORT)
80.0000 ug | PREFILLED_SYRINGE | INTRAVENOUS | Status: DC | PRN
  Filled 2011-07-24: qty 5

## 2011-07-24 MED ORDER — NALBUPHINE SYRINGE 5 MG/0.5 ML
5.0000 mg | INJECTION | INTRAMUSCULAR | Status: DC | PRN
  Administered 2011-07-24: 10 mg via INTRAVENOUS
  Administered 2011-07-24: 5 mg via INTRAVENOUS
  Filled 2011-07-24: qty 0.5
  Filled 2011-07-24: qty 1

## 2011-07-24 MED ORDER — LIDOCAINE HCL (PF) 1 % IJ SOLN
INTRAMUSCULAR | Status: DC | PRN
  Administered 2011-07-24 (×2): 3 mL

## 2011-07-24 MED ORDER — EPHEDRINE 5 MG/ML INJ: 10.0000 mg | INTRAVENOUS | Status: DC | PRN

## 2011-07-24 NOTE — Progress Notes (Signed)
Bonnie Conrad is a 41 y.o. [id number] at [redacted]w[redacted]d by admitted for external version and induction of labor due to unstable fetal lie.  Subjective: Pt reports cramping and constant abdominal pressure.  She desires epidural later in labor but denies need for this now.  Objective: BP 92/73  Pulse 83  Temp(Src) 98 F (36.7 C) (Oral)  Resp 18  Ht 5' (1.524 m)  Wt 60.328 kg (133 lb)  BMI 25.97 kg/m2  SpO2 97%  LMP 10/23/2010  Breastfeeding? Unknown      FHT:  FHR: 135 bpm, variability: moderate,  accelerations:  Present,  decelerations:  Absent UC:   None on toco SVE:   Dilation: 4 Station: Ballotable Exam by:: [name] Following initial external version, Dr Shawnie Pons returned to room x1 to adjust fetal position and abdominal binder.  Fetus remains in vertex position examined via U/S.  Labs: Lab Results  Component Value Date   WBC 10.7* 07/24/2011   HGB 8.7* 07/24/2011   HCT 28.0* 07/24/2011   MCV 76.5* 07/24/2011   PLT 225 07/24/2011    Assessment / Plan: Induction of labor due to unstable fetal lie  Labor: Progressing on Pitocin, will continue to increase then AROM Preeclampsia:  N/A Fetal Wellbeing:  Category I Pain Control:  Labor support without medications I/D:  N/A Anticipated MOD:  NSVD  Bonnie Conrad, Bonnie Conrad 07/24/2011, 7:38 PM

## 2011-07-24 NOTE — Progress Notes (Signed)
Informal [place] for presentation Homero Fellers Breech; AFV subjectively WNL.   Virginia Smith notified.

## 2011-07-24 NOTE — H&P (Signed)
Bonnie Conrad is a 41 y.o. female presenting for external version and IOL for unstable lie. Maternal Medical History:  Reason for admission: Reason for Admission:   nauseaContractions: Frequency: irregular.   Duration is approximately 30 seconds.   Perceived severity is mild.    Fetal activity: Perceived fetal activity is normal.   Last perceived fetal movement was within the past hour.    Prenatal complications: 2-vessel cord Unstable fetal lie Depression and suicidal ideations  Prenatal Complications - Diabetes: none.    OB History    Grav Para Term Preterm Abortions TAB SAB Ect Mult Living   9 5 5  0 3 0 3 0 0 5     Past Medical History  Diagnosis Date  . Abdominal hernia   . Hernia of abdominal cavity 2009  . Depression     hosp during pregnancy at behav health   Past Surgical History  Procedure Date  . No past surgeries    Family History: family history includes Hypertension in her mother.  There is no history of Anesthesia problems, and Hypotension, and Malignant hyperthermia, and Pseudochol deficiency, . Social History:  reports that she has never smoked. She has never used smokeless tobacco. She reports that she does not drink alcohol or use illicit drugs.  Review of Systems  Constitutional: Negative for fever, chills and malaise/fatigue.  Eyes: Negative for blurred vision.  Respiratory: Negative for cough and shortness of breath.   Cardiovascular: Negative for chest pain.  Gastrointestinal: Negative for heartburn, nausea and vomiting.  Genitourinary: Negative for dysuria, urgency and frequency.  Musculoskeletal: Negative.   Neurological: Negative for dizziness and headaches.  Psychiatric/Behavioral: Positive for depression. The patient is nervous/anxious.        Pt denies suicidal ideations today      Last menstrual period 10/23/2010, unknown if currently breastfeeding. Maternal Exam:  Uterine Assessment: Contraction strength is mild.   Contraction frequency is irregular.   Abdomen: Patient reports no abdominal tenderness. Fetal presentation: breech  Pelvis: adequate for delivery.   Cervix: Cervix evaluated by digital exam.     Fetal Exam Fetal Monitor Review: Mode: ultrasound.   Baseline rate: 135.  Variability: moderate (6-25 bpm).   Pattern: accelerations present and no decelerations.    Fetal State Assessment: Category I - tracings are normal.     Physical Exam  Nursing note and vitals reviewed. Constitutional: She is oriented to person, place, and time. She appears well-developed and well-nourished.  Neck: Normal range of motion.  Cardiovascular: Normal rate, regular rhythm and normal heart sounds.   Respiratory: Effort normal and breath sounds normal.  GI: Soft.  Musculoskeletal: Normal range of motion.  Neurological: She is alert and oriented to person, place, and time.  Skin: Skin is warm and dry.  Psychiatric: She has a normal mood and affect. Her behavior is normal. Judgment and thought content normal.    Cervix 4 cm per Dr Shawnie Pons External version performed by Dr Shawnie Pons successfully. Binder placed on pt abdomen.  Prenatal labs: ABO, Rh: O/POS/-- (11/21 1112) Antibody: NEG (11/21 1112) Rubella: 106.6 (11/21 1112) RPR: NON REAC (01/16 1016)  HBsAg: NEGATIVE (11/21 1112)  HIV: NON REACTIVE (11/21 1112)  GBS: Negative (03/27 0000)   Assessment/Plan: Admit to birthing suites External version by Dr Shawnie Pons IOL for unstable lie with Pitocin   LEFTWICH-KIRBY, Jeremiah Tarpley 07/24/2011, 1:11 PM

## 2011-07-24 NOTE — Progress Notes (Signed)
Bonnie Conrad is a 41 y.o. [id number] at [redacted]w[redacted]d by admitted for induction of labor due to unstable lie.  Subjective: Pt coping well with contractions.  Support person at bedside.  Objective: BP 92/73  Pulse 83  Temp(Src) 98 F (36.7 C) (Oral)  Resp 18  Ht 5' (1.524 m)  Wt 60.328 kg (133 lb)  BMI 25.97 kg/m2  SpO2 97%  LMP 10/23/2010  Breastfeeding? Unknown      FHT:  FHR: 135 bpm, variability: moderate,  accelerations:  Present,  decelerations:  Absent UC:   regular, every 3-5 minutes, poor tracing r/t abdominal binder SVE:   Dilation: 4 Station: Ballotable Exam by:: [name]  Dr Emelda Fear to room to evaluate fetal lie with U/S.  Fetus vertex.  Binder readjusted.  Labs: Lab Results  Component Value Date   WBC 10.7* 07/24/2011   HGB 8.7* 07/24/2011   HCT 28.0* 07/24/2011   MCV 76.5* 07/24/2011   PLT 225 07/24/2011    Assessment / Plan: Induction of labor due to unstable lie,  progressing well on pitocin  Labor: Progressing normally Preeclampsia:  N/A Fetal Wellbeing:  Category I Pain Control:  Labor support without medications I/D:  n/a Anticipated MOD:  NSVD  LEFTWICH-KIRBY, Bonnie Conrad 07/24/2011, 7:45 PM

## 2011-07-24 NOTE — Anesthesia Procedure Notes (Signed)
Epidural Patient location during procedure: OB Start time: 07/24/2011 11:53 PM  Staffing Anesthesiologist: Wandalee Klang A. Performed by: anesthesiologist   Preanesthetic Checklist Completed: patient identified, site marked, surgical consent, pre-op evaluation, timeout performed, IV checked, risks and benefits discussed and monitors and equipment checked  Epidural Patient position: sitting Prep: site prepped and draped and DuraPrep Patient monitoring: continuous pulse ox and blood pressure Approach: midline Injection technique: LOR air  Needle:  Needle type: Tuohy  Needle gauge: 17 G Needle length: 9 cm Needle insertion depth: 4 cm Catheter type: closed end flexible Catheter size: 19 Gauge Catheter at skin depth: 9 cm Test dose: negative and Other  Assessment Events: blood not aspirated, injection not painful, no injection resistance, negative IV test and no paresthesia  Additional Notes Patient identified. Risks and benefits discussed including failed block, incomplete  Pain control, post dural puncture headache, nerve damage, paralysis, blood pressure Changes, nausea, vomiting, reactions to medications-both toxic and allergic and post Partum back pain. All questions were answered. Patient expressed understanding and wished to proceed. Sterile technique was used throughout procedure. Epidural site was Dressed with sterile barrier dressing. No paresthesias, signs of intravascular injection Or signs of intrathecal spread were encountered.  Patient was more comfortable after the epidural was dosed. Please see RN's note for documentation of vital signs and FHR which are stable.

## 2011-07-24 NOTE — Anesthesia Preprocedure Evaluation (Deleted)
Anesthesia Evaluation  Patient identified by MRN, date of birth, ID band Patient awake    Reviewed: Allergy & Precautions, H&P , Patient's Chart, lab work & pertinent test results  Airway Mallampati: III TM Distance: >3 FB Neck ROM: full    Dental No notable dental hx. (+) Teeth Intact   Pulmonary neg pulmonary ROS,  breath sounds clear to auscultation  Pulmonary exam normal       Cardiovascular negative cardio ROS  Rhythm:regular Rate:Normal     Neuro/Psych Depression negative neurological ROS     GI/Hepatic negative GI ROS, Neg liver ROS,   Endo/Other  negative endocrine ROS  Renal/GU negative Renal ROS  negative genitourinary   Musculoskeletal negative musculoskeletal ROS (+)   Abdominal Normal abdominal exam  (+)   Peds  Hematology  (+) Blood dyscrasia, anemia ,   Anesthesia Other Findings   Reproductive/Obstetrics (+) Pregnancy                           Anesthesia Physical Anesthesia Plan  ASA: II  Anesthesia Plan: Epidural   Post-op Pain Management:    Induction:   Airway Management Planned:   Additional Equipment:   Intra-op Plan:   Post-operative Plan:   Informed Consent: I have reviewed the patients History and Physical, chart, labs and discussed the procedure including the risks, benefits and alternatives for the proposed anesthesia with the patient or authorized representative who has indicated his/her understanding and acceptance.     Plan Discussed with: Anesthesiologist  Anesthesia Plan Comments:         Anesthesia Quick Evaluation

## 2011-07-24 NOTE — Anesthesia Preprocedure Evaluation (Signed)
Anesthesia Evaluation  Patient identified by MRN, date of birth, ID band Patient awake    Reviewed: Allergy & Precautions, H&P , Patient's Chart, lab work & pertinent test results  Airway Mallampati: III TM Distance: >3 FB Neck ROM: full    Dental No notable dental hx. (+) Teeth Intact   Pulmonary neg pulmonary ROS,  breath sounds clear to auscultation  Pulmonary exam normal       Cardiovascular negative cardio ROS  Rhythm:regular Rate:Normal     Neuro/Psych Depression negative neurological ROS     GI/Hepatic negative GI ROS, Neg liver ROS,   Endo/Other  negative endocrine ROS  Renal/GU negative Renal ROS  negative genitourinary   Musculoskeletal negative musculoskeletal ROS (+)   Abdominal Normal abdominal exam  (+)   Peds  Hematology negative hematology ROS (+)   Anesthesia Other Findings   Reproductive/Obstetrics (+) Pregnancy                           Anesthesia Physical Anesthesia Plan  ASA: II  Anesthesia Plan: Epidural   Post-op Pain Management:    Induction:   Airway Management Planned:   Additional Equipment:   Intra-op Plan:   Post-operative Plan:   Informed Consent: I have reviewed the patients History and Physical, chart, labs and discussed the procedure including the risks, benefits and alternatives for the proposed anesthesia with the patient or authorized representative who has indicated his/her understanding and acceptance.     Plan Discussed with: Anesthesiologist  Anesthesia Plan Comments:         Anesthesia Quick Evaluation

## 2011-07-24 NOTE — Progress Notes (Signed)
No presenting part in pelvis. [place] for presentation-breech. Direct admit to [facility] for version and IOL per [name].

## 2011-07-24 NOTE — Progress Notes (Signed)
Pulse- 86  Pressure-"like she has to go the bathroom"  Pain- back pain "comes and goes"  Vaginal discharge- yellow and sticky Interpreter # [phone]

## 2011-07-24 NOTE — Progress Notes (Signed)
Bonnie Conrad is a 41 y.o. [id number] at [redacted]w[redacted]d by ultrasound admitted for induction of labor due to unstable presenting part, converted again to vertex.  Subjective:Pt remains dramatic in presentation and intolerance of labor pain.   She has experienced nausea and vomiting with discomfort.  Abd binder in place to assist with keeping presenting part as vertex. She desires epidural   Objective: BP 102/53  Pulse 83  Temp(Src) 98 F (36.7 C) (Oral)  Resp 18  Ht 5' (1.524 m)  Wt 60.328 kg (133 lb)  BMI 25.97 kg/m2  SpO2 97%  LMP 10/23/2010  Breastfeeding? Unknown      FHT:  FHR: 140 bpm, variability: moderate,  accelerations:  Present,  decelerations:  Absent UC:   regular, every 3-4 minutes SVE:   4-5 with contraction, presenting part confirmed as vertex by u/s at bedside by me, and AROM performed with distince pop and slow trickle of fluid, clear.  Labs: Lab Results  Component Value Date   WBC 10.7* 07/24/2011   HGB 8.7* 07/24/2011   HCT 28.0* 07/24/2011   MCV 76.5* 07/24/2011   PLT 225 07/24/2011    Assessment / Plan: Induction of labor due to unstable lie,  progressing well on pitocin  Labor: Progressing normally and iv analgesics now, epidural later Preeclampsia:  no signs or symptoms of toxicity Fetal Wellbeing:  Category I Pain Control:  nubain x 1 I/D:   Anticipated MOD:  NSVD  Kamisha Ell V 07/24/2011, 8:51 PM

## 2011-07-24 NOTE — Procedures (Signed)
After informed verbal consent, Terbutaline 0.25 mg SQ given, ECV was attempted under Ultrasound guidance.  FHR stable through entire procedure.  Version successful with prompt resumption of breech, transverse, oblique lie x 3.  Finally, binder placed and infant remained vertex. After pt. Up to restroom, infant was oblique again and version done again.  Cx was 4 cm and pitocin was started.  Binder in place.   FHR was reactive before and after the procedure.   Pt. Tolerated the procedure well.

## 2011-07-25 ENCOUNTER — Encounter (HOSPITAL_COMMUNITY): Payer: Self-pay | Admitting: Anesthesiology

## 2011-07-25 ENCOUNTER — Encounter (HOSPITAL_COMMUNITY): Payer: Self-pay | Admitting: *Deleted

## 2011-07-25 ENCOUNTER — Encounter (HOSPITAL_COMMUNITY)
Admission: AD | Disposition: A | Discharge status: Home / Self Care | Payer: Self-pay | Source: Ambulatory Visit | Attending: Family Medicine

## 2011-07-25 ENCOUNTER — Inpatient Hospital Stay (HOSPITAL_COMMUNITY): Payer: Medicaid Other | Admitting: Anesthesiology

## 2011-07-25 DIAGNOSIS — Z302 Encounter for sterilization: Secondary | ICD-10-CM

## 2011-07-25 DIAGNOSIS — O09529 Supervision of elderly multigravida, unspecified trimester: Secondary | ICD-10-CM

## 2011-07-25 HISTORY — PX: TUBAL LIGATION: SHX77

## 2011-07-25 LAB — CBC
HCT: 25.1 % — ABNORMAL LOW (ref 36.0–46.0)
MCHC: 31.1 g/dL (ref 30.0–36.0)
Platelets: 190 10*3/uL (ref 150–400)
RDW: 20.1 % — ABNORMAL HIGH (ref 11.5–15.5)
WBC: 16.7 10*3/uL — ABNORMAL HIGH (ref 4.0–10.5)

## 2011-07-25 LAB — MRSA PCR SCREENING: MRSA by PCR: NEGATIVE

## 2011-07-25 SURGERY — LIGATION, FALLOPIAN TUBE, POSTPARTUM
Anesthesia: Epidural | Site: Abdomen | Laterality: Bilateral | Wound class: Clean Contaminated

## 2011-07-25 MED ORDER — OXYTOCIN 20 UNITS IN LACTATED RINGERS INFUSION - SIMPLE: 125.0000 mL/h | INTRAVENOUS | Status: DC | PRN

## 2011-07-25 MED ORDER — FENTANYL CITRATE 0.05 MG/ML IJ SOLN
INTRAMUSCULAR | Status: AC
  Filled 2011-07-25: qty 5

## 2011-07-25 MED ORDER — DIPHENHYDRAMINE HCL 25 MG PO CAPS: 25.0000 mg | ORAL_CAPSULE | Freq: Four times a day (QID) | ORAL | Status: DC | PRN

## 2011-07-25 MED ORDER — LANOLIN HYDROUS EX OINT: TOPICAL_OINTMENT | CUTANEOUS | Status: DC | PRN

## 2011-07-25 MED ORDER — METOCLOPRAMIDE HCL 10 MG PO TABS
10.0000 mg | ORAL_TABLET | Freq: Once | ORAL | Status: AC
  Administered 2011-07-25: 10 mg via ORAL
  Filled 2011-07-25: qty 1

## 2011-07-25 MED ORDER — BENZOCAINE-MENTHOL 20-0.5 % EX AERO
INHALATION_SPRAY | CUTANEOUS | Status: AC
  Administered 2011-07-25: 1 via TOPICAL
  Filled 2011-07-25: qty 56

## 2011-07-25 MED ORDER — FAMOTIDINE 20 MG PO TABS: 40.0000 mg | ORAL_TABLET | Freq: Once | ORAL | Status: DC

## 2011-07-25 MED ORDER — KETOROLAC TROMETHAMINE 30 MG/ML IJ SOLN: 15.0000 mg | Freq: Once | INTRAMUSCULAR | Status: DC | PRN

## 2011-07-25 MED ORDER — MIDAZOLAM HCL 2 MG/2ML IJ SOLN
INTRAMUSCULAR | Status: AC
  Filled 2011-07-25: qty 2

## 2011-07-25 MED ORDER — HYDROMORPHONE HCL PF 1 MG/ML IJ SOLN: 0.2500 mg | INTRAMUSCULAR | Status: DC | PRN

## 2011-07-25 MED ORDER — ONDANSETRON HCL 4 MG PO TABS: 4.0000 mg | ORAL_TABLET | ORAL | Status: DC | PRN

## 2011-07-25 MED ORDER — SIMETHICONE 80 MG PO CHEW: 80.0000 mg | CHEWABLE_TABLET | ORAL | Status: DC | PRN

## 2011-07-25 MED ORDER — FENTANYL CITRATE 0.05 MG/ML IJ SOLN
INTRAMUSCULAR | Status: DC | PRN
  Administered 2011-07-25: 100 ug via INTRAVENOUS
  Administered 2011-07-25 (×3): 50 ug via INTRAVENOUS

## 2011-07-25 MED ORDER — BENZOCAINE-MENTHOL 20-0.5 % EX AERO
1.0000 "application " | INHALATION_SPRAY | CUTANEOUS | Status: DC | PRN
  Administered 2011-07-25: 1 via TOPICAL

## 2011-07-25 MED ORDER — SODIUM BICARBONATE 8.4 % IV SOLN
INTRAVENOUS | Status: DC | PRN
  Administered 2011-07-25: 3 mL via EPIDURAL

## 2011-07-25 MED ORDER — PROMETHAZINE HCL 25 MG/ML IJ SOLN: 6.2500 mg | INTRAMUSCULAR | Status: DC | PRN

## 2011-07-25 MED ORDER — BUPIVACAINE HCL (PF) 0.25 % IJ SOLN
INTRAMUSCULAR | Status: DC | PRN
  Administered 2011-07-25: 10 mL

## 2011-07-25 MED ORDER — WITCH HAZEL-GLYCERIN EX PADS
1.0000 "application " | MEDICATED_PAD | CUTANEOUS | Status: DC | PRN
  Administered 2011-07-26: 1 via TOPICAL

## 2011-07-25 MED ORDER — MEPERIDINE HCL 25 MG/ML IJ SOLN: 6.2500 mg | INTRAMUSCULAR | Status: DC | PRN

## 2011-07-25 MED ORDER — KETOROLAC TROMETHAMINE 30 MG/ML IJ SOLN
INTRAMUSCULAR | Status: AC
  Filled 2011-07-25: qty 1

## 2011-07-25 MED ORDER — LACTATED RINGERS IV SOLN
INTRAVENOUS | Status: DC
  Administered 2011-07-25: 12:00:00 via INTRAVENOUS

## 2011-07-25 MED ORDER — BUPIVACAINE HCL (PF) 0.25 % IJ SOLN
INTRAMUSCULAR | Status: AC
  Filled 2011-07-25: qty 30

## 2011-07-25 MED ORDER — FAMOTIDINE 20 MG PO TABS
40.0000 mg | ORAL_TABLET | Freq: Once | ORAL | Status: AC
  Administered 2011-07-25: 40 mg via ORAL
  Filled 2011-07-25 (×2): qty 1

## 2011-07-25 MED ORDER — SODIUM BICARBONATE 8.4 % IV SOLN
INTRAVENOUS | Status: AC
  Filled 2011-07-25: qty 50

## 2011-07-25 MED ORDER — TETANUS-DIPHTH-ACELL PERTUSSIS 5-2.5-18.5 LF-MCG/0.5 IM SUSP: 0.5000 mL | Freq: Once | INTRAMUSCULAR | Status: DC

## 2011-07-25 MED ORDER — MIDAZOLAM HCL 5 MG/5ML IJ SOLN
INTRAMUSCULAR | Status: DC | PRN
  Administered 2011-07-25 (×2): 1 mg via INTRAVENOUS

## 2011-07-25 MED ORDER — ONDANSETRON HCL 4 MG/2ML IJ SOLN: 4.0000 mg | INTRAMUSCULAR | Status: DC | PRN

## 2011-07-25 MED ORDER — ZOLPIDEM TARTRATE 5 MG PO TABS: 5.0000 mg | ORAL_TABLET | Freq: Every evening | ORAL | Status: DC | PRN

## 2011-07-25 MED ORDER — PRENATAL MULTIVITAMIN CH
1.0000 | ORAL_TABLET | Freq: Every day | ORAL | Status: DC
  Administered 2011-07-26: 1 via ORAL
  Filled 2011-07-25: qty 1

## 2011-07-25 MED ORDER — ONDANSETRON HCL 4 MG/2ML IJ SOLN
INTRAMUSCULAR | Status: AC
  Filled 2011-07-25: qty 2

## 2011-07-25 MED ORDER — SENNOSIDES-DOCUSATE SODIUM 8.6-50 MG PO TABS
2.0000 | ORAL_TABLET | Freq: Every day | ORAL | Status: DC
  Administered 2011-07-25 – 2011-07-26 (×2): 2 via ORAL

## 2011-07-25 MED ORDER — NALOXONE HCL 0.4 MG/ML IJ SOLN
INTRAMUSCULAR | Status: AC
  Filled 2011-07-25: qty 1

## 2011-07-25 MED ORDER — PROPOFOL 10 MG/ML IV EMUL
INTRAVENOUS | Status: AC
  Filled 2011-07-25: qty 20

## 2011-07-25 MED ORDER — IBUPROFEN 600 MG PO TABS
600.0000 mg | ORAL_TABLET | Freq: Four times a day (QID) | ORAL | Status: DC
  Administered 2011-07-25 – 2011-07-27 (×8): 600 mg via ORAL
  Filled 2011-07-25: qty 2
  Filled 2011-07-25 (×6): qty 1

## 2011-07-25 MED ORDER — SODIUM CHLORIDE 0.9 % IV SOLN: INTRAVENOUS | Status: DC

## 2011-07-25 MED ORDER — OXYCODONE-ACETAMINOPHEN 5-325 MG PO TABS
1.0000 | ORAL_TABLET | ORAL | Status: DC | PRN
  Administered 2011-07-25 (×2): 1 via ORAL
  Administered 2011-07-25: 2 via ORAL
  Administered 2011-07-26: 1 via ORAL
  Administered 2011-07-26: 2 via ORAL
  Administered 2011-07-26: 1 via ORAL
  Filled 2011-07-25: qty 2
  Filled 2011-07-25 (×3): qty 1
  Filled 2011-07-25: qty 2
  Filled 2011-07-25: qty 1

## 2011-07-25 MED ORDER — LIDOCAINE-EPINEPHRINE (PF) 2 %-1:200000 IJ SOLN
INTRAMUSCULAR | Status: AC
  Filled 2011-07-25: qty 20

## 2011-07-25 MED ORDER — ONDANSETRON HCL 4 MG/2ML IJ SOLN
INTRAMUSCULAR | Status: DC | PRN
  Administered 2011-07-25: 4 mg via INTRAVENOUS

## 2011-07-25 MED ORDER — DIBUCAINE 1 % RE OINT
1.0000 "application " | TOPICAL_OINTMENT | RECTAL | Status: DC | PRN
  Administered 2011-07-26: 1 via RECTAL
  Filled 2011-07-25: qty 28

## 2011-07-25 MED ORDER — KETOROLAC TROMETHAMINE 30 MG/ML IJ SOLN
INTRAMUSCULAR | Status: DC | PRN
  Administered 2011-07-25: 30 mg via INTRAVENOUS

## 2011-07-25 SURGICAL SUPPLY — 20 items
CHLORAPREP W/TINT 26ML (MISCELLANEOUS) ×2 IMPLANT
CONTAINER PREFILL 10% NBF 15ML (MISCELLANEOUS) ×4 IMPLANT
DRSG COVADERM PLUS 2X2 (GAUZE/BANDAGES/DRESSINGS) ×1 IMPLANT
GLOVE BIOGEL PI IND STRL 6.5 (GLOVE) ×2 IMPLANT
GLOVE BIOGEL PI INDICATOR 6.5 (GLOVE) ×2
GLOVE SURG SS PI 6.0 STRL IVOR (GLOVE) ×2 IMPLANT
GOWN PREVENTION PLUS LG XLONG (DISPOSABLE) ×4 IMPLANT
NDL HYPO 25X1 1.5 SAFETY (NEEDLE) IMPLANT
NEEDLE HYPO 25X1 1.5 SAFETY (NEEDLE) IMPLANT
NS IRRIG 1000ML POUR BTL (IV SOLUTION) ×2 IMPLANT
PACK ABDOMINAL MINOR (CUSTOM PROCEDURE TRAY) ×2 IMPLANT
SPONGE LAP 4X18 X RAY DECT (DISPOSABLE) IMPLANT
SUT PLAIN 0 NONE (SUTURE) ×2 IMPLANT
SUT VIC AB 0 CT1 27 (SUTURE) ×2
SUT VIC AB 0 CT1 27XBRD ANBCTR (SUTURE) ×1 IMPLANT
SUT VIC AB 3-0 PS2 18 (SUTURE) ×2 IMPLANT
SYR CONTROL 10ML LL (SYRINGE) IMPLANT
TOWEL OR 17X24 6PK STRL BLUE (TOWEL DISPOSABLE) ×4 IMPLANT
TRAY FOLEY CATH 14FR (SET/KITS/TRAYS/PACK) ×2 IMPLANT
WATER STERILE IRR 1000ML POUR (IV SOLUTION) ×2 IMPLANT

## 2011-07-25 NOTE — Anesthesia Postprocedure Evaluation (Signed)
Anesthesia Post Note  Patient: Bonnie Conrad [facility]  Procedure(s) Performed: Procedure(s) (LRB): POST PARTUM TUBAL LIGATION (Bilateral)  Anesthesia type: Epidural  Patient location: PACU  Post pain: Pain level controlled  Post assessment: Post-op Vital signs reviewed  Last Vitals:  Filed Vitals:   07/25/11 0819  BP: 103/61  Pulse: 89  Temp: 37 C  Resp: 18    Post vital signs: Reviewed  Level of consciousness: awake  Complications: No apparent anesthesia complications

## 2011-07-25 NOTE — Progress Notes (Signed)
Patient ID: Bonnie Conrad, female   DOB: May 10, 1970, 41 y.o.   MRN: [id number] Operative Delivery Note At 1:22 AM a viable female was delivered via Vaginal, Vacuum (Extractor).  Presentation: asynclytic right occiput anterior, vertex; Position: Right,, Occiput,, Anterior; Station: +1.  Verbal consent: obtained from patient.  Risks and benefits discussed in detail.  Risks include, but are not limited to the risks of anesthesia, bleeding, infection, damage to maternal tissues, fetal cephalhematoma.  There is also the risk of inability to effect vaginal delivery of the head, or shoulder dystocia that cannot be resolved by established maneuvers, leading to the need for emergency cesarean section. The patient was exhausted from pushing, in various positions including use of the bar, without significant progress thru 1 1/2 hour second stage.  APGAR: 7, 9; weight 7 lb 10.4 oz (3470 g).   Placenta status: Intact, Spontaneous.   Cord: 3 vessels with the following complications: None.  Cord pH:not required   Anesthesia: Epidural  Instruments: Kiwi vacuum Episiotomy: None Lacerations: 1st degree;Vaginal Suture Repair: none Est. Blood Loss (mL):   Mom to postpartum.  Baby to nursery-stable.  Deniss Wormley V 07/25/2011, 1:41 AM

## 2011-07-25 NOTE — Anesthesia Postprocedure Evaluation (Signed)
  Anesthesia Post-op Note  Patient: Bonnie Conrad [facility]  Procedure(s) Performed: * No procedures listed *  Patient Location: Mother/Baby  Anesthesia Type: Epidural  Level of Consciousness: awake, alert  and oriented  Airway and Oxygen Therapy: Patient Spontanous Breathing  Post-op Pain: mild  Post-op Assessment: Patient's Cardiovascular Status Stable, Respiratory Function Stable, Patent Airway, No signs of Nausea or vomiting and Pain level controlled  Post-op Vital Signs: stable  Complications: No apparent anesthesia complications

## 2011-07-25 NOTE — Anesthesia Preprocedure Evaluation (Signed)
Anesthesia Evaluation  Patient identified by MRN, date of birth, ID band Patient awake    Reviewed: Allergy & Precautions, H&P , Patient's Chart, lab work & pertinent test results  Airway Mallampati: III TM Distance: >3 FB Neck ROM: full    Dental No notable dental hx. (+) Teeth Intact   Pulmonary neg pulmonary ROS,  breath sounds clear to auscultation  Pulmonary exam normal       Cardiovascular negative cardio ROS  Rhythm:regular Rate:Normal     Neuro/Psych Depression negative neurological ROS     GI/Hepatic negative GI ROS, Neg liver ROS,   Endo/Other  negative endocrine ROS  Renal/GU negative Renal ROS  negative genitourinary   Musculoskeletal negative musculoskeletal ROS (+)   Abdominal Normal abdominal exam  (+)   Peds  Hematology negative hematology ROS (+)   Anesthesia Other Findings   Reproductive/Obstetrics (+) Pregnancy                           Anesthesia Physical  Anesthesia Plan  ASA: II  Anesthesia Plan: Epidural   Post-op Pain Management:    Induction:   Airway Management Planned:   Additional Equipment:   Intra-op Plan:   Post-operative Plan:   Informed Consent: I have reviewed the patients History and Physical, chart, labs and discussed the procedure including the risks, benefits and alternatives for the proposed anesthesia with the patient or authorized representative who has indicated his/her understanding and acceptance.     Plan Discussed with: Anesthesiologist, CRNA and Surgeon  Anesthesia Plan Comments:         Anesthesia Quick Evaluation

## 2011-07-25 NOTE — Progress Notes (Signed)
Patient ID: Bonnie Conrad, female   DOB: 04-18-1970, 41 y.o.   MRN: [id number]  41 y.o. yo [id number] doing well, without complaints. Patient with undesired fertility,status post vaginal delivery, desires permanent sterilization. Risks and benefits of procedure discussed with patient including permanence of method, bleeding, infection, injury to surrounding organs and need for additional procedures. Risk failure of 0.5-1% with increased risk of ectopic gestation if pregnancy occurs was also discussed with patient. All questions were answered. Patient verbalized understanding. Consent signed. Patient counseled in the presence of spanish interpreter, [employer].

## 2011-07-25 NOTE — Op Note (Signed)
Bonnie Conrad 07/24/2011 - 07/25/2011  PREOPERATIVE DIAGNOSIS:  Undesired fertility  POSTOPERATIVE DIAGNOSIS:  Undesired fertility  PROCEDURE:  Postpartum Bilateral Tubal Sterilization using Pomeroy method   SURGEON:  Dr  Catalina Antigua  ASSISTANT: Dr Candelaria Celeste  ANESTHESIA:  Epidural  COMPLICATIONS:  None immediate.  ESTIMATED BLOOD LOSS:  Less than 20cc.  FLUIDS: 1500 cc LR.  URINE OUTPUT:  300 cc of clear urine.  INDICATIONS: 41 y.o. yo [id number]  with undesired fertility,status post vaginal delivery, desires permanent sterilization. Risks and benefits of procedure discussed with patient including permanence of method, bleeding, infection, injury to surrounding organs and need for additional procedures. Risk failure of 0.5-1% with increased risk of ectopic gestation if pregnancy occurs was also discussed with patient.   FINDINGS:  Normal uterus, tubes, and ovaries.  TECHNIQUE: After informed consent was obtained, the patient was taken to the operating room where anesthesia was induced and found to be adequate. A small transverse, infraumbilical skin incision was made with the scalpel. This incision was carried down to the underlying layer of fascia. The fascia was grasped with Kocher clamps tented up and entered sharply with Mayo scissors. Underlying peritoneum was then identified tented up and entered sharply with Metzenbaum scissors. The fascia was tagged with 0 Vicryl. The patient's left fallopian tube was then identified, brought to the incision, and grasped with a Babcock clamp. The tube was then followed out to the fimbria. The Babcock clamp was then used to grasp the tube approximately 4 cm from the cornual region. A 3 cm segment of the tube was then ligated with free tie of plain gut suture, transected and excised. Good hemostasis was noted and the tube was returned to the abdomen. The right fallopian tube was then identified to its fimbriated end, ligated, and a 3 cm  segment excised in a similar fashion. Excellent hemostasis was noted, and the tube returned to the abdomen. The fascia was re-approximated with 0 Vicryl. The skin was closed in a subcuticular fashion with 3-0 Vicryl. Quarter percent Marcaine solution was then injected at the incision site. The patient tolerated the procedure well. Sponge, lap, and needle count were correct x2. The patient was taken to recovery room in stable condition.

## 2011-07-25 NOTE — Transfer of Care (Signed)
Immediate Anesthesia Transfer of Care Note  Patient: Bonnie Conrad [facility]  Procedure(s) Performed: Procedure(s) (LRB): POST PARTUM TUBAL LIGATION (Bilateral)  Patient Location: PACU  Anesthesia Type: Epidural  Level of Consciousness: awake, alert  and oriented  Airway & Oxygen Therapy: Patient Spontanous Breathing and Patient connected to nasal cannula oxygen  Post-op Assessment: Report given to PACU RN and Post -op Vital signs reviewed and stable  Post vital signs: stable  Complications: No apparent anesthesia complications

## 2011-07-25 NOTE — Progress Notes (Signed)
UR Chart review completed.  

## 2011-07-26 MED ORDER — BISACODYL 10 MG RE SUPP
10.0000 mg | Freq: Once | RECTAL | Status: AC
  Administered 2011-07-26: 10 mg via RECTAL
  Filled 2011-07-26: qty 1

## 2011-07-26 NOTE — Progress Notes (Signed)
Post Partum Day 1, POD#1 from [facility] BTL Subjective: Having mild back and abd pain.  Eating and drinking well.  Objective: Blood pressure 101/63, pulse 74, temperature 97.4 F (36.3 C), temperature source Oral, resp. rate 18, height 5' (1.524 m), weight 60.328 kg (133 lb), last menstrual period 10/23/2010, SpO2 99.00%, unknown if currently breastfeeding.  Physical Exam:  General: alert, cooperative and no distress Abd: mild distension Lochia: appropriate Uterine Fundus: firm Incision: healing well, no significant drainage, no dehiscence DVT Evaluation: No evidence of DVT seen on physical exam. Negative Homan's sign.   Basename 07/25/11 0540 07/24/11 1330  HGB 7.8* 8.7*  HCT 25.1* 28.0*    Assessment/Plan: Plan for discharge tomorrow and Social Work consult.  Suppository ordered.   LOS: 2 days   Ariaunna Longsworth JEHIEL 07/26/2011, 10:04 AM

## 2011-07-26 NOTE — Progress Notes (Signed)
Sw met with pt briefly to assess current situation.  Pt was accompanied by her sister and FOB, as she did not want them to leave the room.  Pt told Sw that she is feeling better and not having the same thoughts she reported to this Sw 2 weeks ago (harming her child).  She denies any depression or SI at this time.   CPS is involved and plans to put intensive in-home counseling services in place and continue following.  Sw called CPS worker, [name] and was advised that she does not have any concerns about the pt being alone with the child.  Sw provided pt with a bundle pack of clothing, as she expressed need for supplies.  Sw was not able to observe pt's interaction with the infant, as her sister was attending to the baby.  Sw available to assist further if needed.

## 2011-07-27 MED ORDER — OXYCODONE-ACETAMINOPHEN 5-325 MG PO TABS: 1.0000 | ORAL_TABLET | ORAL | Status: DC | PRN

## 2011-07-27 MED ORDER — IBUPROFEN 600 MG PO TABS: 600.0000 mg | ORAL_TABLET | Freq: Four times a day (QID) | ORAL | Status: DC

## 2011-07-27 MED ORDER — SENNOSIDES-DOCUSATE SODIUM 8.6-50 MG PO TABS: 2.0000 | ORAL_TABLET | Freq: Every day | ORAL | Status: DC

## 2011-07-27 NOTE — Discharge Summary (Signed)
Obstetric Discharge Summary Reason for Admission: induction of labor and external version due to unstable lie Prenatal Procedures: Version Intrapartum Procedures: spontaneous vaginal delivery Postpartum Procedures: P.P. tubal ligation Complications-Operative and Postpartum: Concern for pt and family safety due to comments made 2wks prior made by pt concerning hurting the children. SW evaluated and felt pt OK to go home w/ intensive in home counseling.   Hemoglobin  Date Value Range Status  07/25/2011 7.8* 12.0-15.0 (g/dL) Final     HCT  Date Value Range Status  07/25/2011 25.1* 36.0-46.0 (%) Final    Physical Exam:  General: alert, cooperative, appears stated age and no distress Lochia: appropriate Uterine Fundus: firm Incision: Umbilical incision w/ gauze in place that is clean dry and intact w/o erythema and induration DVT Evaluation: No evidence of DVT seen on physical exam.  Discharge Diagnoses: Term Pregnancy-delivered  Discharge Information: Date: 07/27/2011 Activity: unrestricted and pelvic rest Diet: routine Medications: Ibuprofen, Colace and Percocet Condition: stable Instructions: refer to practice specific booklet Discharge to: home SW saw and evaluated pt and felt pt safe to go home w/ baby. Intensive in home counseling has been set up for pt upon DC.   Newborn Data: Live born female  Birth Weight: 7 lb 10.4 oz (3470 g) APGAR: 7, 9  Home with mother.  Bonnie Conrad 07/27/2011, 7:35 AM

## 2011-07-27 NOTE — Discharge Summary (Signed)
Saw patient, agree with note.

## 2011-07-29 ENCOUNTER — Emergency Department (HOSPITAL_COMMUNITY): Payer: Self-pay

## 2011-07-29 ENCOUNTER — Inpatient Hospital Stay (HOSPITAL_COMMUNITY)
Admission: AD | Admit: 2011-07-29 | Discharge: 2011-07-30 | Disposition: A | Discharge status: Home / Self Care | Payer: Self-pay | Source: Ambulatory Visit | Attending: Internal Medicine | Admitting: Internal Medicine

## 2011-07-29 ENCOUNTER — Encounter (HOSPITAL_COMMUNITY): Payer: Self-pay | Admitting: Obstetrics and Gynecology

## 2011-07-29 DIAGNOSIS — N730 Acute parametritis and pelvic cellulitis: Secondary | ICD-10-CM

## 2011-07-29 DIAGNOSIS — N390 Urinary tract infection, site not specified: Secondary | ICD-10-CM | POA: Insufficient documentation

## 2011-07-29 DIAGNOSIS — O2390 Unspecified genitourinary tract infection in pregnancy, unspecified trimester: Secondary | ICD-10-CM | POA: Insufficient documentation

## 2011-07-29 DIAGNOSIS — R109 Unspecified abdominal pain: Secondary | ICD-10-CM | POA: Insufficient documentation

## 2011-07-29 DIAGNOSIS — IMO0002 Reserved for concepts with insufficient information to code with codable children: Secondary | ICD-10-CM

## 2011-07-29 LAB — DIFFERENTIAL
Lymphocytes Relative: 16 % (ref 12–46)
Lymphs Abs: 2 10*3/uL (ref 0.7–4.0)
Monocytes Relative: 5 % (ref 3–12)
Neutrophils Relative %: 78 % — ABNORMAL HIGH (ref 43–77)

## 2011-07-29 LAB — COMPREHENSIVE METABOLIC PANEL
ALT: 57 U/L — ABNORMAL HIGH (ref 0–35)
Alkaline Phosphatase: 142 U/L — ABNORMAL HIGH (ref 39–117)
BUN: 9 mg/dL (ref 6–23)
CO2: 23 mEq/L (ref 19–32)
Chloride: 106 mEq/L (ref 96–112)
GFR calc Af Amer: >90 mL/min (ref >90)
GFR calc non Af Amer: >90 mL/min (ref >90)
Glucose, Bld: 75 mg/dL (ref 70–99)
Potassium: 4 mEq/L (ref 3.5–5.1)
Sodium: 140 mEq/L (ref 135–145)
Total Bilirubin: 0.4 mg/dL (ref 0.3–1.2)

## 2011-07-29 LAB — URINALYSIS, ROUTINE W REFLEX MICROSCOPIC
Ketones, ur: NEGATIVE mg/dL
Nitrite: NEGATIVE
pH: 6.5 (ref 5.0–8.0)

## 2011-07-29 LAB — CBC
Hemoglobin: 9 g/dL — ABNORMAL LOW (ref 12.0–15.0)
Platelets: 261 10*3/uL (ref 150–400)
RBC: 3.69 MIL/uL — ABNORMAL LOW (ref 3.87–5.11)
WBC: 12.1 10*3/uL — ABNORMAL HIGH (ref 4.0–10.5)

## 2011-07-29 LAB — URINE MICROSCOPIC-ADD ON

## 2011-07-29 LAB — WET PREP, GENITAL: Clue Cells Wet Prep HPF POC: NONE SEEN

## 2011-07-29 MED ORDER — DEXTROSE 5 % IV SOLN
500.0000 mg | Freq: Once | INTRAVENOUS | Status: AC
  Administered 2011-07-29: 500 mg via INTRAVENOUS
  Filled 2011-07-29: qty 500

## 2011-07-29 MED ORDER — HYDROMORPHONE HCL PF 1 MG/ML IJ SOLN
1.0000 mg | INTRAMUSCULAR | Status: DC | PRN
  Filled 2011-07-29: qty 1

## 2011-07-29 MED ORDER — SODIUM CHLORIDE 0.9 % IV BOLUS (SEPSIS)
1000.0000 mL | Freq: Once | INTRAVENOUS | Status: AC
  Administered 2011-07-29: 1000 mL via INTRAVENOUS

## 2011-07-29 MED ORDER — ONDANSETRON HCL 4 MG/2ML IJ SOLN
4.0000 mg | Freq: Once | INTRAMUSCULAR | Status: AC
  Administered 2011-07-29: 4 mg via INTRAVENOUS
  Filled 2011-07-29: qty 2

## 2011-07-29 MED ORDER — HYDROMORPHONE HCL PF 1 MG/ML IJ SOLN
1.0000 mg | Freq: Once | INTRAMUSCULAR | Status: AC
  Administered 2011-07-29: 1 mg via INTRAVENOUS
  Filled 2011-07-29: qty 1

## 2011-07-29 MED ORDER — DEXTROSE 5 % IV SOLN
1.0000 g | Freq: Once | INTRAVENOUS | Status: AC
  Administered 2011-07-29: 1 g via INTRAVENOUS
  Filled 2011-07-29: qty 10

## 2011-07-29 MED ORDER — HYDROMORPHONE HCL PF 1 MG/ML IJ SOLN
1.0000 mg | Freq: Once | INTRAMUSCULAR | Status: AC
  Administered 2011-07-29: 1 mg via INTRAVENOUS

## 2011-07-29 NOTE — ED Notes (Signed)
pts home [phone]

## 2011-07-29 NOTE — MAU Note (Signed)
Pt arrives from [facility] via [facility]. Per interpreter pt states she went over there because she has had pain since her discharge from [facility]. States she was visiting her daughter at [facility] and she passed out. The personnel there took her down to the ER,. States at that time she was feeling short of breath. S/P vaginal delivery on 04/11, BTL 04/12, discharged home on 04/13. Bottle feeding.

## 2011-07-29 NOTE — ED Provider Notes (Signed)
History     CSN: [id number]  Arrival date & time 07/29/11  1629   First MD Initiated Contact with Patient 07/29/11 1824      Chief Complaint  Patient presents with  . Abdominal Pain    (Consider location/radiation/quality/duration/timing/severity/associated sxs/prior treatment) Patient is a 41 y.o. female presenting with abdominal pain. The history is provided by the patient and the spouse. The history is limited by a language barrier. A language interpreter was used.  Abdominal Pain The primary symptoms of the illness include abdominal pain and vaginal bleeding. The primary symptoms of the illness do not include fever, fatigue, shortness of breath, nausea, vomiting, diarrhea, hematemesis or vaginal discharge. The current episode started 3 to 5 hours ago. The onset of the illness was sudden. The problem has been gradually worsening.  The abdominal pain began 2 days ago. The pain came on gradually. The abdominal pain has been gradually worsening since its onset. The abdominal pain is located in the suprapubic region. The abdominal pain does not radiate. The severity of the abdominal pain is 8/10. The abdominal pain is relieved by nothing.  The patient states that she believes she is currently not pregnant. The patient has not had a change in bowel habit. Additional symptoms associated with the illness include urgency, frequency and back pain. Symptoms associated with the illness do not include chills, diaphoresis, constipation or hematuria. Significant associated medical issues do not include inflammatory bowel disease, diabetes, substance abuse, diverticulitis or HIV.   41 year old female coming in today with  onset of severe suprapubic pain 4 days after vaginal delivery and tubal ligation with dressing in tact.    States that she was upstairs visiting her daughter who is admitted for attempted suicide and the pain became excruciating 10 out of 10. States that since she delivered she's had this  pain but today it is much worse. Denies nausea vomiting or fever. States she does have some dysuria with this. States that last time she was pregnant in 2011 she had to go back to surgery to remove part of her placenta that did not deliver. She took some oxycodone for pain 11 AM this morning. pmh of abdominal hernia repair and depression with [facility] admission.     Past Medical History  Diagnosis Date  . Abdominal hernia   . Hernia of abdominal cavity 2009  . Depression     hosp [facility]    Past Surgical History  Procedure Date  . No past surgeries   . Dilation and curettage of uterus   . Tubal ligation 07/25/2011    Procedure: POST PARTUM TUBAL LIGATION;  Surgeon: Catalina Antigua, MD;  Location: [facility];  Service: Gynecology;  Laterality: Bilateral;    Family History  Problem Relation Age of Onset  . Hypertension Mother   . Anesthesia problems Neg Hx   . Hypotension Neg Hx   . Malignant hyperthermia Neg Hx   . Pseudochol deficiency Neg Hx     History  Substance Use Topics  . Smoking status: Never Smoker   . Smokeless tobacco: Never Used  . Alcohol Use: No    OB History    Grav Para Term Preterm Abortions TAB SAB Ect Mult Living   9 6 6  0 3 0 3 0 0 6      Review of Systems  Constitutional: Negative for fever, chills, diaphoresis and fatigue.  Respiratory: Negative for shortness of breath.   Gastrointestinal: Positive for abdominal pain. Negative for nausea,  vomiting, diarrhea, constipation and hematemesis.  Genitourinary: Positive for urgency, frequency and vaginal bleeding. Negative for hematuria and vaginal discharge.  Musculoskeletal: Positive for back pain.    Allergies  Review of patient's allergies indicates no known allergies.  Home Medications   Current Outpatient Rx  Name Route Sig Dispense Refill  . DIPHENHYDRAMINE HCL (SLEEP) 25 MG PO TABS Oral Take 25 mg by mouth at bedtime as needed. For anxiety    . IBUPROFEN 600 MG PO TABS Oral  Take 600 mg by mouth every 6 (six) hours as needed. For pain    . OXYCODONE-ACETAMINOPHEN 5-325 MG PO TABS Oral Take 1-2 tablets by mouth every 3 (three) hours as needed. For pain    . SENNOSIDES-DOCUSATE SODIUM 8.6-50 MG PO TABS Oral Take 2 tablets by mouth at bedtime.      BP 119/58  Pulse 72  Temp(Src) 98.5 F (36.9 C) (Oral)  Resp 21  SpO2 97%  LMP 10/23/2010  Breastfeeding? Unknown  Physical Exam  Nursing note and vitals reviewed. Constitutional: She is oriented to person, place, and time. She appears well-developed and well-nourished.  HENT:  Head: Normocephalic and atraumatic.  Eyes: Conjunctivae and EOM are normal. Pupils are equal, round, and reactive to light.  Neck: Normal range of motion. Neck supple.  Cardiovascular: Normal rate, regular rhythm, normal heart sounds and intact distal pulses.  Exam reveals no gallop and no friction rub.   No murmur heard. Pulmonary/Chest: Effort normal and breath sounds normal.  Abdominal: Soft. Bowel sounds are normal. Hernia confirmed negative in the right inguinal area and confirmed negative in the left inguinal area.  Genitourinary: Rectal exam shows external hemorrhoid and tenderness. There is breast swelling, tenderness and bleeding. Uterus is enlarged and tender. Cervix exhibits motion tenderness. Right adnexum displays no tenderness. Left adnexum displays no tenderness. There is tenderness and bleeding around the vagina.  Musculoskeletal: Normal range of motion. She exhibits no edema and no tenderness.  Lymphadenopathy:       Right: No inguinal adenopathy present.       Left: No inguinal adenopathy present.  Neurological: She is alert and oriented to person, place, and time. She has normal reflexes.  Skin: Skin is warm and dry.  Psychiatric: She has a normal mood and affect.    ED Course  Pelvic exam Date/Time: 07/29/2011 6:39 PM Performed by: Remi Haggard Authorized by: Remi Haggard Consent: Verbal consent  obtained. Risks and benefits: risks, benefits and alternatives were discussed Consent given by: patient Patient understanding: patient states understanding of the procedure being performed Patient identity confirmed: verbally with patient, arm band, provided demographic data and hospital-assigned identification number Time out: Immediately prior to procedure a "time out" was called to verify the correct patient, procedure, equipment, support staff and site/side marked as required. Preparation: Patient was prepped and draped in the usual sterile fashion. Local anesthesia used: no Patient sedated: no Patient tolerance: Patient tolerated the procedure well with no immediate complications.   (including critical care time)   Labs Reviewed  URINALYSIS, ROUTINE W REFLEX MICROSCOPIC  CBC  DIFFERENTIAL  COMPREHENSIVE METABOLIC PANEL  LIPASE, BLOOD  WET PREP, GENITAL  GC/CHLAMYDIA PROBE AMP, GENITAL   No results found.   No diagnosis found.    MDM  Spoke with Dr. Debroah Loop with [facility] teaching service and he will accept patient in MAU and examine.  + UTI treated with rocephen in ER.  Tenderness upon examination.  U/s may or may not show retained products of conception.  PMH  of the same with last baby.  Patient is ready for transport.  Received dilaudid 1mg  iv x 3 in the ER.          Remi Haggard, NP 07/30/11 1059

## 2011-07-29 NOTE — ED Notes (Signed)
Pt ambulatory without difficulty.  No distress noted.

## 2011-07-29 NOTE — ED Notes (Signed)
The pt was upstairs visiting her daughter when she came running out of the room fell in the floor she  Was crying and saying that she had abd  Pain.  She delivered vaginally one week ago her fifth baby.  She also had a tubal ligation then.  The incision appears  normal

## 2011-07-29 NOTE — ED Notes (Signed)
Pt updated on POC.  carelink about 5 minutes out to pick patient up.  pts husband went home.  No distress noted.  Pt continues to rest.

## 2011-07-29 NOTE — Discharge Instructions (Signed)
Go to [facility] hospital admission to be examined by Dr. Debroah Loop by care link nowInfeccin del tracto urinario (Urinary Tract Infection) Las infecciones en el tracto urinario pueden comenzar en varios lugares. Una infeccin en la vejiga (cistitis), una infeccin en el rin (pielonefritis) o una infeccin en la prstata (prostatitis) son diferentes tipos de infeccin del tracto urinario. Por lo general mejoran si se los trata con antibiticos. Los antibiticos son medicamentos que matan grmenes. [employer] medicamentos que le han recetado hasta que se terminen. Podr sentirse bien dentro de [address], pero DEBE TOMAR LOS MEDICAMENTOS HASTA TERMINAR EL TRATAMIENTO, de lo contrario la infeccin puede no solucionarse y luego ser ms difcil de Warehouse manager. INSTRUCCIONES PARA EL CUIDADO DOMICILIARIO  Beba gran cantidad de lquidos para mantener la orina de tono claro o color amarillo plido. Se recomienda especialmente el jugo de arndanos rojos, adems de grandes cantidades de [place].   Evite la cafena, el t y las bebidas con gas. Estas sustancias irritan la vejiga.   El alcohol puede Teacher, adult education.   Utilice los medicamentos de venta libre o de prescripcin para Chief Technology Officer, Environmental health practitioner o la [place], segn se lo indique el profesional que lo asiste.  PARA PREVENIR FUTURAS INFECCIONES:  Vace la vejiga con frecuencia. Evite retener la orina durante largos perodos.   Despus de mover el intestino, las mujeres deben higienizarse la regin perineal desde adelante hacia atrs. Use cada papel tissue slo una vez.   Vace la vejiga antes y despus de Management consultant.  OBTENER LOS RESULTADOS DE LAS PRUEBAS Durante su visita no contar con todos los [employer]. En este caso, tenga otra entrevista con su mdico para conocerlos. No piense que el resultado es normal si no tiene noticias de su mdico o de la institucin mdica. Es Copy seguimiento de todos los [place] de [place].  SOLICITE ATENCIN MDICA SI:  Siente dolor en la espalda.   El beb tiene ms de 3 meses y su temperatura rectal es de 100.5 F (38.1 C) o ms durante ms de 1 da.   Los problemas (sntomas) no mejoran en 3 das. Solicite atencin mdica antes si empeora.  SOLICITE ATENCIN MDICA DE INMEDIATO SI:  Comienza a sentir un dolor de espaldas o en la zona abdominal inferior intenso.   Comienza a sentir escalofros.   Tiene fiebre.   Su beb tiene ms de 3 meses y su temperatura rectal es de 102 F (38.9 C) o mayor.   Su beb tiene 3 meses o menos y su temperatura rectal es de 100.4 F (38 C) o mayor.   Siente nuseas o vmitos.   Tiene una sensacin continua de quemazn o molestias al [employer].  EST SEGURO QUE:  Comprende las instrucciones para el alta mdica.   Controlar su enfermedad.   Solicitar atencin mdica de inmediato segn las indicaciones.  Document Released: 01/09/2005 Document Revised: 03/21/2011 [facility] Patient Information 2012 [place], [place].Infeccin del tracto urinario (Urinary Tract Infection) Las infecciones en el tracto urinario pueden comenzar en varios lugares. Una infeccin en la vejiga (cistitis), una infeccin en el rin (pielonefritis) o una infeccin en la prstata (prostatitis) son diferentes tipos de infeccin del tracto urinario. Por lo general mejoran si se los trata con antibiticos. Los antibiticos son medicamentos que matan grmenes. [employer] medicamentos que le han recetado hasta que se terminen. Podr sentirse bien dentro de [address], pero DEBE TOMAR LOS MEDICAMENTOS HASTA TERMINAR EL TRATAMIENTO, de lo  contrario la infeccin puede no solucionarse y luego ser ms difcil de Warehouse manager. INSTRUCCIONES PARA EL CUIDADO DOMICILIARIO  Beba gran cantidad de lquidos para mantener la orina de tono claro o color amarillo plido. Se recomienda especialmente el jugo de arndanos rojos, adems de grandes cantidades de [place].   Evite la  cafena, el t y las bebidas con gas. Estas sustancias irritan la vejiga.   El alcohol puede Teacher, adult education.   Utilice los medicamentos de venta libre o de prescripcin para Chief Technology Officer, Environmental health practitioner o la [place], segn se lo indique el profesional que lo asiste.  PARA PREVENIR FUTURAS INFECCIONES:  Vace la vejiga con frecuencia. Evite retener la orina durante largos perodos.   Despus de mover el intestino, las mujeres deben higienizarse la regin perineal desde adelante hacia atrs. Use cada papel tissue slo una vez.   Vace la vejiga antes y despus de Management consultant.  OBTENER LOS RESULTADOS DE LAS PRUEBAS Durante su visita no contar con todos los [employer]. En este caso, tenga otra entrevista con su mdico para conocerlos. No piense que el resultado es normal si no tiene noticias de su mdico o de la institucin mdica. Es Copy seguimiento de todos los [place] de [place].  SOLICITE ATENCIN MDICA SI:  Siente dolor en la espalda.   El beb tiene ms de 3 meses y su temperatura rectal es de 100.5 F (38.1 C) o ms durante ms de 1 da.   Los problemas (sntomas) no mejoran en 3 das. Solicite atencin mdica antes si empeora.  SOLICITE ATENCIN MDICA DE INMEDIATO SI:  Comienza a sentir un dolor de espaldas o en la zona abdominal inferior intenso.   Comienza a sentir escalofros.   Tiene fiebre.   Su beb tiene ms de 3 meses y su temperatura rectal es de 102 F (38.9 C) o mayor.   Su beb tiene 3 meses o menos y su temperatura rectal es de 100.4 F (38 C) o mayor.   Siente nuseas o vmitos.   Tiene una sensacin continua de quemazn o molestias al [employer].  EST SEGURO QUE:  Comprende las instrucciones para el alta mdica.   Controlar su enfermedad.   Solicitar atencin mdica de inmediato segn las indicaciones.  Document Released: 01/09/2005 Document Revised: 03/21/2011 [facility] Patient Information 2012  [place], [place].

## 2011-07-29 NOTE — ED Notes (Signed)
carelink at bedside.  Pt transferred to womens.

## 2011-07-29 NOTE — ED Notes (Signed)
Pt was received to POD 8 with c/o vaginal bleeding and abdominal cramping onset today. Pt claimed that she has used 4 pads in the last 30 minutes. Pt just had a baby via vaginal delivery last Thursday without any complications and had a tubal ligation. Pt is noted to be grimacing in pain. AOkey Dupre PAC is at the bedside

## 2011-07-29 NOTE — ED Notes (Signed)
Assumed care of pt.  Pt remains in [place].  Will update when she returns.   [facility] called.  Needs to speak with patient.  Will have patient call [phone] when she returns.

## 2011-07-29 NOTE — MAU Note (Signed)
Bernita Buffy CNM notified of pt.

## 2011-07-30 ENCOUNTER — Inpatient Hospital Stay (HOSPITAL_COMMUNITY): Payer: Self-pay

## 2011-07-30 LAB — GC/CHLAMYDIA PROBE AMP, GENITAL: Chlamydia, DNA Probe: NEGATIVE

## 2011-07-30 MED ORDER — SULFAMETHOXAZOLE-TRIMETHOPRIM 800-160 MG PO TABS: 1.0000 | ORAL_TABLET | Freq: Two times a day (BID) | ORAL | Status: AC

## 2011-07-30 MED ORDER — KETOROLAC TROMETHAMINE 30 MG/ML IJ SOLN
30.0000 mg | Freq: Once | INTRAMUSCULAR | Status: AC
  Administered 2011-07-30: 30 mg via INTRAVENOUS
  Filled 2011-07-30: qty 1

## 2011-07-30 NOTE — MAU Provider Note (Signed)
History     CSN: [id number]  Arrival date and time: 07/29/11 1629   None     Chief Complaint  Patient presents with  . Abdominal Pain   HPI [id number] S/p VAVD 4/10 with spontaneous delivery intact placenta per Dr. Emelda Fear, now with increased low abdominal pain and moderate bleeding, no fever, no clots, tissue.  Pertinent Gynecological History: Menses: postpartum Bleeding: postpartum Contraception: tubal ligation DES exposure: denies Blood transfusions: none Sexually transmitted diseases: no past history Previous GYN Procedures: DNC    Past Medical History  Diagnosis Date  . Abdominal hernia   . Hernia of abdominal cavity 2009  . Depression     hosp [facility] at [facility]    Past Surgical History  Procedure Date  . Dilation and curettage of uterus   . Tubal ligation 07/25/2011    Procedure: POST PARTUM TUBAL LIGATION;  Surgeon: Catalina Antigua, MD;  Location: [facility];  Service: Gynecology;  Laterality: Bilateral;    Family History  Problem Relation Age of Onset  . Hypertension Mother   . Anesthesia problems Neg Hx   . Hypotension Neg Hx   . Malignant hyperthermia Neg Hx   . Pseudochol deficiency Neg Hx     History  Substance Use Topics  . Smoking status: Never Smoker   . Smokeless tobacco: Never Used  . Alcohol Use: No    Allergies: No Known Allergies   Review of Systems  Constitutional: Negative for fever.  Respiratory: Negative for shortness of breath.   Gastrointestinal: Positive for abdominal pain. Negative for nausea.  Genitourinary: Negative for dysuria.   Physical Exam   Blood pressure 95/71, pulse 81, temperature 99.4 F (37.4 C), temperature source Oral, resp. rate 18, last menstrual period 10/23/2010, SpO2 98.00%, not currently breastfeeding.  Physical Exam  Constitutional: She is oriented to person, place, and time. She appears well-developed and well-nourished.  HENT:  Head: Normocephalic.  Cardiovascular: Normal rate.     GI: Soft. She exhibits no distension and no mass. There is tenderness. There is no rebound and no guarding.  Genitourinary: Vagina normal.       Moderate lochia, cervix open, mild tenderness, uterus 10 weeks   Neurological: She is alert and oriented to person, place, and time.  Skin: Skin is warm and dry.  Psychiatric:       Mildly agitated   CBC    Component Value Date/Time   WBC 12.1* 07/29/2011 1811   RBC 3.69* 07/29/2011 1811   HGB 9.0* 07/29/2011 1811   HCT 28.3* 07/29/2011 1811   PLT 261 07/29/2011 1811   MCV 76.7* 07/29/2011 1811   MCH 24.4* 07/29/2011 1811   MCHC 31.8 07/29/2011 1811   RDW 20.9* 07/29/2011 1811   LYMPHSABS 2.0 07/29/2011 1811   MONOABS 0.6 07/29/2011 1811   EOSABS 0.2 07/29/2011 1811   BASOSABS 0.0 07/29/2011 1811   RADIOLOGY REPORT*  Clinical Data: 1 week postpartum following vaginal delivery.  Question of retained products. Prior bilateral tubal ligation  07/25/2011. History of retained products in 2011. No bowel  movements since 07/20/2011. LMP 10/23/2010.  TRANSABDOMINAL ULTRASOUND OF PELVIS  Technique: Transabdominal ultrasound examination of the pelvis was  performed including evaluation of the uterus, ovaries, adnexal  regions, and pelvic cul-de-sac.  Comparison: None  Findings:  Uterus: Uterus is enlarged and inhomogeneous. There is a complex  appearance of the endometrium. Uterus measures 16.0 x 10.1 x 13.9  cm.  Endometrium: Endometrium is inhomogeneous. The endometrial stripe  is poorly defined. It  is difficult to measure the endometrium.  There is a small amount of fluid and heterogeneous debris within  the canal itself.  Right ovary: The right ovary is 4.8 x 2.9 x 3.9 cm.  Left ovary: The left ovary is not seen, likely due to intervening  bowel loops.  Other Findings: No free pelvic fluid is identified. The rectum is  distended and contains gas and stool.  IMPRESSION:  1. Enlarged postpartum uterus.  2. Heterogeneous endometrial canal,  containing fluid and debris.  The heterogeneous appearance of the canal suggests possible  retained products of conception although the appearance could also  be caused by retained clot.  3. The poorly defined appearance of the endometrial canal makes  the stripe difficult to measure.  Original Report Authenticated By: Patterson Hammersmith, M.D.       *RADIOLOGY REPORT*  Clinical Data: 1 week postpartum; vaginal bleeding and pelvic pain.  TRANSVAGINAL ULTRASOUND OF PELVIS  Technique: Transvaginal ultrasound examination of the pelvis was  performed including evaluation of the uterus, ovaries, adnexal  regions, and pelvic cul-de-sac.  Comparison: Pelvic ultrasound performed 07/29/2011  Findings:  Uterus: Remains diffusely enlarged, reflecting the recent  postpartum state, measuring 14.9 x 8.5 x 13.5 cm.  Endometrium: Diffusely thickened, measuring 3.9 cm in thickness,  with diffuse heterogeneity and complex components, most compatible  with clot. No definite focal abnormal color Doppler blood flow is  seen to suggest retained products of conception.  Right ovary: Not characterized; prominent periuterine vessels are  seen at the right adnexa.  Left ovary: Not characterized; prominent periuterine vessels are  seen at the left adnexa.  Other Findings: No free fluid seen within the pelvic cul-de-sac.  IMPRESSION:  Significant diffuse thickening of the endometrial echo complex,  measuring 3.9 cm, with diffuse heterogeneity and complex  components, most compatible with clot. No definite abnormal color  Doppler blood flow seen to suggest retained products of conception.  Diffuse enlargement of the uterus reflects recent postpartum state.  Original Report Authenticated By: Tonia Ghent, M.D.         MAU Course  Procedures US transvaginal done  MDM mod  Assessment and Plan  UTI, doubt endometritis or retained PPOC. Discharge, PP visit in 5 weeks. Bactrim DS BID for 10 doses.  Report if not improving.  Bonnie Conrad 07/30/2011, 12:25 AM

## 2011-07-30 NOTE — ED Provider Notes (Signed)
Medical screening examination/treatment/procedure(s) were performed by non-physician practitioner and as supervising physician I was immediately available for consultation/collaboration.   Laray Anger, DO 07/30/11 1136

## 2011-07-30 NOTE — MAU Note (Signed)
Dr. Debroah Loop notified of pt U/S result.

## 2011-07-30 NOTE — MAU Note (Signed)
Dr. Arnold at bedside 

## 2011-08-01 LAB — URINE CULTURE: Culture  Setup Time: 201304152359

## 2011-08-05 NOTE — Op Note (Signed)
Agree with above note.  Bonnie Conrad 08/05/2011 9:42 AM   

## 2011-08-29 ENCOUNTER — Ambulatory Visit (INDEPENDENT_AMBULATORY_CARE_PROVIDER_SITE_OTHER): Payer: Self-pay | Admitting: Obstetrics and Gynecology

## 2011-08-29 ENCOUNTER — Encounter: Payer: Self-pay | Admitting: Obstetrics and Gynecology

## 2011-08-29 NOTE — Progress Notes (Signed)
  Subjective:     Bonnie Conrad is a 41 y.o. female [id number] who presents for a postpartum visit. She is 5 weeks postpartum following a spontaneous vaginal delivery. I have fully reviewed the prenatal and intrapartum course. IOL for unstable lie, version before IOL. The delivery was at 39 gestational weeks. Outcome: spontaneous vaginal delivery. Anesthesia: epidural. Postpartum course has been uncomplicated, however she had hgb 7.4 in hospital and is on vitamin, but not iron supplement. Denies orthostatic sx. . Baby's course has been uncomplicated. Baby is feeding by bottle. Bleeding staining only. Bowel function is normal. Bladder function is normal. Patient is sexually active. Contraception method is tubal ligation. Postpartum depression screening: negative. Does have hx depression and suicidal ideation in past.     Review of Systems Pertinent items are noted in HPI.   Objective:    BP 98/56  Pulse 82  Temp(Src) 97.6 F (36.4 C) (Oral)  Ht 5' (1.524 m)  Wt 112 lb 11.2 oz (51.12 kg)  BMI 22.01 kg/m2  Breastfeeding? No  General:  alert, cooperative and no distress   Breasts:  inspection negative, no nipple discharge or bleeding, no masses or nodularity palpable  Lungs: clear to auscultation bilaterally  Heart:  regular rate and rhythm, S1, S2 normal, no murmur, click, rub or gallop  Abdomen: soft, non-tender; bowel sounds normal; no masses,  no organomegaly 6-7 cm diastasis recti with fascial ridge bulge   Vulva:  not evaluated  Vagina: not evaluated  Cervix:  deferred  Corpus: Not examined  Adnexa:  not evaluated  Rectal Exam: Not performed.        Assessment:    5 wk postpartum exam. Pap smear not done at today's visit.   Plan:    1. Contraception: tubal ligation 2. Chronic Fe defic anemai -> start iron 1 po bid 3. Follow up in: 3 months for Pap or as needed.

## 2011-09-15 ENCOUNTER — Encounter (HOSPITAL_COMMUNITY): Payer: Self-pay | Admitting: *Deleted

## 2011-09-15 ENCOUNTER — Emergency Department (HOSPITAL_COMMUNITY)
Admission: EM | Admit: 2011-09-15 | Discharge: 2011-09-15 | Disposition: A | Discharge status: Home / Self Care | Payer: No Typology Code available for payment source | Attending: Emergency Medicine | Admitting: Emergency Medicine

## 2011-09-15 DIAGNOSIS — IMO0002 Reserved for concepts with insufficient information to code with codable children: Secondary | ICD-10-CM | POA: Insufficient documentation

## 2011-09-15 DIAGNOSIS — R10819 Abdominal tenderness, unspecified site: Secondary | ICD-10-CM | POA: Insufficient documentation

## 2011-09-15 MED ORDER — METRONIDAZOLE 500 MG PO TABS
ORAL_TABLET | ORAL | Status: AC
  Filled 2011-09-15: qty 4

## 2011-09-15 MED ORDER — LEVONORGESTREL 0.75 MG PO TABS
ORAL_TABLET | ORAL | Status: AC
  Administered 2011-09-15: 0.75 mg via ORAL
  Filled 2011-09-15: qty 2

## 2011-09-15 MED ORDER — AZITHROMYCIN 1 G PO PACK
PACK | ORAL | Status: AC
  Administered 2011-09-15: 1 g via ORAL
  Filled 2011-09-15: qty 1

## 2011-09-15 MED ORDER — CEFIXIME 400 MG PO TABS
ORAL_TABLET | ORAL | Status: AC
  Administered 2011-09-15: 400 mg via ORAL
  Filled 2011-09-15: qty 1

## 2011-09-15 MED ORDER — PROMETHAZINE HCL 25 MG PO TABS
ORAL_TABLET | ORAL | Status: AC
  Filled 2011-09-15: qty 3
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
## 2011-09-15 NOTE — ED Notes (Signed)
SANE TRANSPORTED PT. TO SANE EXAM ROOM .

## 2011-09-16 LAB — POCT PREGNANCY, URINE: Preg Test, Ur: NEGATIVE

## 2012-03-01 ENCOUNTER — Encounter (HOSPITAL_COMMUNITY): Payer: Self-pay | Admitting: *Deleted

## 2012-03-01 DIAGNOSIS — K469 Unspecified abdominal hernia without obstruction or gangrene: Secondary | ICD-10-CM | POA: Insufficient documentation

## 2012-03-01 LAB — CBC WITH DIFFERENTIAL/PLATELET
Basophils Absolute: 0 10*3/uL (ref 0.0–0.1)
Lymphocytes Relative: 37 % (ref 12–46)
Neutro Abs: 5.2 10*3/uL (ref 1.7–7.7)
Neutrophils Relative %: 53 % (ref 43–77)
Platelets: 254 10*3/uL (ref 150–400)
RDW: 17 % — ABNORMAL HIGH (ref 11.5–15.5)
WBC: 9.8 10*3/uL (ref 4.0–10.5)

## 2012-03-01 LAB — BASIC METABOLIC PANEL
CO2: 22 mEq/L (ref 19–32)
Calcium: 9.1 mg/dL (ref 8.4–10.5)
Chloride: 105 mEq/L (ref 96–112)
Potassium: 3.5 mEq/L (ref 3.5–5.1)
Sodium: 139 mEq/L (ref 135–145)

## 2012-03-01 NOTE — ED Notes (Addendum)
C/o abd pain, relates to umbilical hernia, onset 0730 today, (denies fever, nvd).  Last at 1730. Last BM yesterday. BS in all 4 quadrants. abd soft & tender below unmbilicus, no hernia appreciated.

## 2012-03-01 NOTE — ED Notes (Signed)
Called pt; to take back to rm #5, no answer. Pt not in waiting room. Will call again

## 2012-03-02 ENCOUNTER — Emergency Department (HOSPITAL_COMMUNITY)
Admission: EM | Admit: 2012-03-02 | Discharge: 2012-03-02 | Discharge status: Against Medical Advice | Payer: Self-pay | Attending: Emergency Medicine | Admitting: Emergency Medicine

## 2012-03-02 NOTE — ED Notes (Signed)
Pt called x3, no answer, unable to find, not in w/r, h/w, b/r entry way or triage.

## 2012-09-05 ENCOUNTER — Emergency Department (HOSPITAL_COMMUNITY)
Admission: EM | Admit: 2012-09-05 | Discharge: 2012-09-05 | Disposition: A | Discharge status: Home / Self Care | Payer: No Typology Code available for payment source | Attending: Emergency Medicine | Admitting: Emergency Medicine

## 2012-09-05 ENCOUNTER — Encounter (HOSPITAL_COMMUNITY): Payer: Self-pay | Admitting: *Deleted

## 2012-09-05 DIAGNOSIS — Z8719 Personal history of other diseases of the digestive system: Secondary | ICD-10-CM | POA: Insufficient documentation

## 2012-09-05 DIAGNOSIS — Z8659 Personal history of other mental and behavioral disorders: Secondary | ICD-10-CM | POA: Insufficient documentation

## 2012-09-05 DIAGNOSIS — S161XXA Strain of muscle, fascia and tendon at neck level, initial encounter: Secondary | ICD-10-CM

## 2012-09-05 DIAGNOSIS — Y92410 Unspecified street and highway as the place of occurrence of the external cause: Secondary | ICD-10-CM | POA: Insufficient documentation

## 2012-09-05 DIAGNOSIS — Y9389 Activity, other specified: Secondary | ICD-10-CM | POA: Insufficient documentation

## 2012-09-05 DIAGNOSIS — S139XXA Sprain of joints and ligaments of unspecified parts of neck, initial encounter: Secondary | ICD-10-CM | POA: Insufficient documentation

## 2012-09-05 MED ORDER — NAPROXEN 500 MG PO TABS: 500.0000 mg | ORAL_TABLET | Freq: Two times a day (BID) | ORAL | Status: DC

## 2012-09-05 NOTE — ED Notes (Signed)
Used language line for patient
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
## 2012-09-05 NOTE — ED Notes (Signed)
Discharge instructions reviewed with pt. Pt verbalized understanding.   
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
## 2013-06-27 ENCOUNTER — Emergency Department (HOSPITAL_COMMUNITY): Payer: Self-pay

## 2013-06-27 ENCOUNTER — Encounter (HOSPITAL_COMMUNITY): Payer: Self-pay | Admitting: Emergency Medicine

## 2013-06-27 ENCOUNTER — Emergency Department (HOSPITAL_COMMUNITY): Payer: Medicaid Other

## 2013-06-27 ENCOUNTER — Emergency Department (HOSPITAL_COMMUNITY)
Admission: EM | Admit: 2013-06-27 | Discharge: 2013-06-27 | Disposition: A | Discharge status: Home / Self Care | Payer: Self-pay | Attending: Emergency Medicine | Admitting: Emergency Medicine

## 2013-06-27 DIAGNOSIS — Z8719 Personal history of other diseases of the digestive system: Secondary | ICD-10-CM | POA: Insufficient documentation

## 2013-06-27 DIAGNOSIS — N739 Female pelvic inflammatory disease, unspecified: Secondary | ICD-10-CM

## 2013-06-27 DIAGNOSIS — Z8659 Personal history of other mental and behavioral disorders: Secondary | ICD-10-CM | POA: Insufficient documentation

## 2013-06-27 DIAGNOSIS — Z3189 Encounter for other procreative management: Secondary | ICD-10-CM | POA: Insufficient documentation

## 2013-06-27 DIAGNOSIS — R112 Nausea with vomiting, unspecified: Secondary | ICD-10-CM

## 2013-06-27 DIAGNOSIS — N83209 Unspecified ovarian cyst, unspecified side: Secondary | ICD-10-CM

## 2013-06-27 DIAGNOSIS — Z3202 Encounter for pregnancy test, result negative: Secondary | ICD-10-CM | POA: Insufficient documentation

## 2013-06-27 LAB — I-STAT CG4 LACTIC ACID, ED: Lactic Acid, Venous: 0.53 mmol/L (ref 0.5–2.2)

## 2013-06-27 LAB — CBC WITH DIFFERENTIAL/PLATELET
BASOS ABS: 0 10*3/uL (ref 0.0–0.1)
BASOS PCT: 0 % (ref 0–1)
EOS ABS: 0.2 10*3/uL (ref 0.0–0.7)
Eosinophils Relative: 3 % (ref 0–5)
HCT: 36.9 % (ref 36.0–46.0)
HEMOGLOBIN: 12.5 g/dL (ref 12.0–15.0)
Lymphocytes Relative: 36 % (ref 12–46)
Lymphs Abs: 3.4 10*3/uL (ref 0.7–4.0)
MCH: 29.5 pg (ref 26.0–34.0)
MCHC: 33.9 g/dL (ref 30.0–36.0)
MCV: 87 fL (ref 78.0–100.0)
MONO ABS: 0.6 10*3/uL (ref 0.1–1.0)
MONOS PCT: 7 % (ref 3–12)
NEUTROS ABS: 5 10*3/uL (ref 1.7–7.7)
NEUTROS PCT: 54 % (ref 43–77)
Platelets: 224 10*3/uL (ref 150–400)
RBC: 4.24 MIL/uL (ref 3.87–5.11)
RDW: 16.1 % — AB (ref 11.5–15.5)
WBC: 9.3 10*3/uL (ref 4.0–10.5)

## 2013-06-27 LAB — COMPREHENSIVE METABOLIC PANEL
ALBUMIN: 4.2 g/dL (ref 3.5–5.2)
ALT: 10 U/L (ref 0–35)
AST: 16 U/L (ref 0–37)
Alkaline Phosphatase: 70 U/L (ref 39–117)
BILIRUBIN TOTAL: 0.3 mg/dL (ref 0.3–1.2)
BUN: 11 mg/dL (ref 6–23)
CHLORIDE: 105 meq/L (ref 96–112)
CO2: 21 mEq/L (ref 19–32)
CREATININE: 0.58 mg/dL (ref 0.50–1.10)
Calcium: 9.5 mg/dL (ref 8.4–10.5)
GFR calc Af Amer: >90 mL/min (ref >90)
GFR calc non Af Amer: >90 mL/min (ref >90)
Glucose, Bld: 110 mg/dL — ABNORMAL HIGH (ref 70–99)
Potassium: 3.9 mEq/L (ref 3.7–5.3)
Sodium: 141 mEq/L (ref 137–147)
TOTAL PROTEIN: 7.5 g/dL (ref 6.0–8.3)

## 2013-06-27 LAB — URINE MICROSCOPIC-ADD ON

## 2013-06-27 LAB — URINALYSIS, ROUTINE W REFLEX MICROSCOPIC
Bilirubin Urine: NEGATIVE
GLUCOSE, UA: NEGATIVE mg/dL
Ketones, ur: NEGATIVE mg/dL
LEUKOCYTES UA: NEGATIVE
NITRITE: NEGATIVE
PH: 5.5 (ref 5.0–8.0)
Protein, ur: NEGATIVE mg/dL
SPECIFIC GRAVITY, URINE: 1.009 (ref 1.005–1.030)
Urobilinogen, UA: 0.2 mg/dL (ref 0.0–1.0)

## 2013-06-27 LAB — WET PREP, GENITAL
CLUE CELLS WET PREP: NONE SEEN
Trich, Wet Prep: NONE SEEN
YEAST WET PREP: NONE SEEN

## 2013-06-27 LAB — LIPASE, BLOOD: LIPASE: 36 U/L (ref 11–59)

## 2013-06-27 LAB — POC URINE PREG, ED: PREG TEST UR: NEGATIVE

## 2013-06-27 MED ORDER — HYDROMORPHONE HCL PF 1 MG/ML IJ SOLN
1.0000 mg | Freq: Once | INTRAMUSCULAR | Status: AC
  Administered 2013-06-27: 1 mg via INTRAVENOUS
  Filled 2013-06-27: qty 1

## 2013-06-27 MED ORDER — DOXYCYCLINE HYCLATE 100 MG PO CAPS: 100.0000 mg | ORAL_CAPSULE | Freq: Two times a day (BID) | ORAL | Status: DC

## 2013-06-27 MED ORDER — PROMETHAZINE HCL 25 MG/ML IJ SOLN
12.5000 mg | Freq: Once | INTRAMUSCULAR | Status: AC
  Administered 2013-06-27: 12.5 mg via INTRAVENOUS
  Filled 2013-06-27: qty 1

## 2013-06-27 MED ORDER — AZITHROMYCIN 250 MG PO TABS
1000.0000 mg | ORAL_TABLET | Freq: Once | ORAL | Status: AC
  Administered 2013-06-27: 1000 mg via ORAL
  Filled 2013-06-27: qty 4

## 2013-06-27 MED ORDER — ONDANSETRON HCL 4 MG/2ML IJ SOLN
4.0000 mg | Freq: Once | INTRAMUSCULAR | Status: AC
  Administered 2013-06-27: 4 mg via INTRAVENOUS
  Filled 2013-06-27: qty 2

## 2013-06-27 MED ORDER — IOHEXOL 300 MG/ML  SOLN
100.0000 mL | Freq: Once | INTRAMUSCULAR | Status: AC | PRN
  Administered 2013-06-27: 100 mL via INTRAVENOUS

## 2013-06-27 MED ORDER — MORPHINE SULFATE 4 MG/ML IJ SOLN
4.0000 mg | Freq: Once | INTRAMUSCULAR | Status: AC
  Administered 2013-06-27: 4 mg via INTRAVENOUS
  Filled 2013-06-27: qty 1

## 2013-06-27 MED ORDER — DEXTROSE 5 % IV SOLN
250.0000 mg | Freq: Once | INTRAVENOUS | Status: AC
  Administered 2013-06-27: 250 mg via INTRAVENOUS
  Filled 2013-06-27: qty 250

## 2013-06-27 MED ORDER — IOHEXOL 300 MG/ML  SOLN
50.0000 mL | Freq: Once | INTRAMUSCULAR | Status: AC | PRN
  Administered 2013-06-27: 50 mL via ORAL

## 2013-06-27 MED ORDER — LORAZEPAM 2 MG/ML IJ SOLN
0.5000 mg | Freq: Once | INTRAMUSCULAR | Status: AC
  Administered 2013-06-27: 0.5 mg via INTRAVENOUS
  Filled 2013-06-27: qty 1

## 2013-06-27 MED ORDER — HYDROCODONE-ACETAMINOPHEN 5-325 MG PO TABS: ORAL_TABLET | ORAL | Status: DC

## 2013-06-27 MED ORDER — PROMETHAZINE HCL 25 MG PO TABS: 25.0000 mg | ORAL_TABLET | Freq: Four times a day (QID) | ORAL | Status: DC | PRN

## 2013-06-27 NOTE — ED Notes (Signed)
Pt anxious and stated that her body felt nervous. HR 120-130 EDP notifed.

## 2013-06-27 NOTE — ED Notes (Signed)
Patient transported to CT 

## 2013-06-27 NOTE — ED Notes (Signed)
Report given to  Allie Bossier RN

## 2013-06-27 NOTE — Discharge Instructions (Signed)
Enfermedad inflamatoria plvica (Pelvic Inflammatory Disease)  La enfermedad inflamatoria plvica (PID) se refiere a una infeccin en algunos o todos de los rganos femeninos. La infeccin puede estar en el tero, en los ovarios, en las trompas de [place] o en los tejidos circundantes en la pelvis. La PID puede causar un dolor abdominal o plvico que aparece repentinamente (dolor plvico agudo). La PID es una infeccin grave, ya que puede conducir a un dolor plvico duradero (crnico) o a la imposibilidad de Best boy hijos (infertilidad).  CAUSAS  La infeccin generalmente es causada por las bacterias normales que se encuentran en los tejidos vaginales. Otras causas son las infecciones que se transmiten durante el contacto sexual. Tambin puede aparecer despus de:   El nacimiento de un beb.   Un aborto espontneo.   Un aborto inducido.   Una ciruga plvica mayor.   El uso de un dispositivo intrauterino (DIU).   Una violacin sexual.  FACTORES DE RIESGO  Ciertos factores pueden aumentar el riesgo de PID, por ejemplo:   Ser menor de 25 aos de edad.  Ser sexualmente activa a una edad temprana.  El uso de anticonceptivos que no son de barrera.  Tener mltiples compaeros sexuales.  Tener relaciones sexuales con alguien que tiene sntomas de una infeccin genital.  El uso de anticonceptivos orales. Otras veces, ciertos factores pueden aumentar la posibilidad de sufrir una PID, por ejemplo:   Tener relaciones sexuales durante la menstruacin.  Usar una ducha vaginal.  Tener un DIU (dispositivo intrauterino) en su lugar. SNTOMAS   Dolor abdominal o plvico   Fiebre.   Escalofros.   Flujo vaginal anormal.  Sangrado uterino anormal.   Dolor inusual poco despus de finalizado el perodo. DIAGNSTICO  El mdico decidir algunos de los siguientes mtodos para hacer el diagnstico:   Optometrist un examen fsico y Ardelia Mems historia clnica. El examen plvico muestra el  tero y esa zona de la pelvis muy sensibles.   Pruebas de laboratorio, como un test de [place], anlisis de [place] y de [place].  Cultivos de la vagina y del cuello uterino para Hydrographic surveyor una infeccin de transmisin sexual (ITS).  Realizar una ecografa.  Una laparoscopia para observar el interior de la pelvis.  TRATAMIENTO   Se pueden prescribir antibiticos por va oral.   Las parejas sexuales deben tratarse cuando la infeccin es [place] en una enfermedad de transmisin sexual (ETS).   Puede que necesite ser hospitalizada para aplicarle los antibiticos por va intravenosa.  En algunos casos poco frecuentes es necesaria la [place]. Pueden pasar semanas hasta la completa curacin. Si se le diagnostica una PID, tambin debern realizarle estudios para descartar el virus de inmunodeficiencia humana (VIH).  [place] los antibiticos como le indic el mdico, si se los receta. Finalice el [place], aunque comience a Sports administrator.   Slo tome medicamentos de venta libre o recetados para Glass blower/designer, las molestias o bajar la fiebre segn las indicaciones de su mdico.   No tenga relaciones sexuales hasta completar el [place], o segn las indicaciones del mdico. Si se confirma la PID, sus compaeros sexuales recientes Youth worker.   [place] con las citas tal como se le indic. SOLICITE ATENCIN MDICA SI:   Margette Fast secrecin vaginal anormal o abundante.   Necesita medicamentos recetados para Conservation officer, historic buildings.   Vomita.   No puede tomar los medicamentos.   Su pareja tiene una enfermedad de transmisin sexual.  Jenne Pane MDICA  DE INMEDIATO SI:   Tiene fiebre.   Aumenta el dolor abdominal o plvico.   Siente escalofros.   Siente dolor al [employer].   No mejora luego de 72 horas del tratamiento.  ASEGRESE DE QUE:   Comprende estas instrucciones.  Controlar su  enfermedad.  Solicitar ayuda de inmediato si no mejora o si empeora. Document Released: 01/09/2005 Document Revised: 07/27/2012 [facility] Patient Information 2014 [place], [place].    Nuseas y Vmitos (Nausea and Vomiting) La nusea es la sensacin de Tree surgeon en el estmago o de la necesidad de vomitar. El vmito es un reflejo por el que los contenidos del estmago salen por la boca. El vmito puede ocasionar prdida de lquidos del organismo (deshidratacin). Los nios y los [employer] pueden deshidratarse rpidamente (en especial si tambin tienen diarrea). Las nuseas y los vmitos son sntoma de un trastorno o enfermedad. Es importante Energy manager causa de los sntomas. CAUSAS  Irritacin directa de la membrana que cubre el [place]. Esta irritacin puede ser resultado del aumento de la produccin de cido, (reflujo gastroesofgico), infecciones, intoxicacin alimentaria, ciertos medicamentos (como antinflamatorios no esteroideos), consumo de alcohol o de tabaco.  Seales del cerebro.Estas seales pueden ser un dolor de cabeza, exposicin al calor, trastornos del odo interno, aumento de la presin en el cerebro por lesiones, infeccin, un tumor o conmocin cerebral, estmulos emocionales o problemas metablicos.  Una obstruccin en el tracto gastrointestinal (obstruccin intestinal).  Ciertas enfermedades como la diabetes, problemas en la vescula biliar, apendicitis, problemas renales, cncer, sepsis, sntomas atpicos de infarto o trastornos alimentarios.  Tratamientos mdicos como la quimioterapia y la radiacin.  Medicamentos que inducen al sueo (anestesia general) durante Clementeen Hoof. DIAGNSTICO  El mdico podr solicitarle algunos anlisis si los problemas no mejoran luego de algunos das. Tambin podrn pedirle anlisis si los sntomas son graves o si el motivo de los vmitos o las nuseas no est claro. Los [employer] ser:   Anlisis de [place].  Anlisis de  [place].  Pruebas de materia fecal.  Cultivos (para buscar evidencias de infeccin).  Radiografas u otros estudios por imgenes. Los [employer] de las pruebas lo ayudarn al mdico a tomar decisiones acerca del mejor curso de tratamiento o la necesidad de [employer].  TRATAMIENTO  Debe estar bien hidratado. Beba con frecuencia pequeas cantidades de lquido.Puede beber agua, bebidas deportivas, caldos claros o comer pequeos trocitos de hielo o gelatina para mantenerse hidratado.Cuando coma, hgalo lentamente para evitar las nuseas.Hay medicamentos para evitar las nuseas que pueden aliviarlo.  INSTRUCCIONES PARA EL CUIDADO DOMICILIARIO  Si su mdico le prescribe medicamentos tmelos como se le haya indicado.  Si no tiene hambre, no se fuerce a comer. Sin embargo, es necesario que tome lquidos.  Si tiene hambre alimntese con una dieta normal, a menos que el mdico le indique otra cosa.  Los mejores alimentos son Ardelia Mems combinacin de carbohidratos complejos (arroz, trigo, papas, pan), carnes magras, yogur, frutas y Photographer.  Evite los alimentos ricos en grasas porque dificultan la digestin.  Beba gran cantidad de lquido para mantener la orina de tono claro o color amarillo plido.  Si est deshidratado, consulte a su mdico para que le d instrucciones especficas para volver a hidratarlo. Los signos de deshidratacin son:  Doristine [place] sed.  Labios y boca secos.  Mareos.  Elmon Else.  Disminucin de la frecuencia y cantidad de la [place].  Confusin.  Tiene el pulso o la respiracin acelerados. SOLICITE ATENCIN MDICA DE INMEDIATO SI:  Vomita sangre o algo similar  a la borra del caf.  La materia fecal (heces) es negra o tiene [place].  Sufre una cefalea grave o rigidez en el cuello.  Se siente confundido.  Siente dolor abdominal intenso.  Tiene dolor en el pecho o dificultad para respirar.  No orina por 8 horas.  Tiene la piel fra y  pegajosa.  Sigue vomitando durante ms de 24 a 48 horas.  Tiene fiebre. ASEGRESE QUE:   Comprende estas instrucciones.  Controlar su enfermedad.  Solicitar ayuda inmediatamente si no mejora o si empeora. Document Released: 04/21/2007 Document Revised: 06/24/2011 [facility] Patient Information 2014 [place], [place].     Narcotic and benzodiazepine use may cause drowsiness, slowed breathing or dependence.  Please use with caution and do not drive, operate machinery or watch young children alone while taking them.  Taking combinations of these medications or drinking alcohol will potentiate these effects.

## 2013-06-27 NOTE — ED Notes (Signed)
Pt complaining of lower abdominal pain x several days. Nausea but no vomiting. Pain diffuses to back. Vaginal discharge. Pt had hernia repair and minimal white puss from site  Since 3 days ago. No cardiac or respiratory distress. Will continue to monitor.

## 2013-06-27 NOTE — ED Provider Notes (Signed)
Patient received in signout from Dr. Sharol Given.  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 Patient continued to have mid abdominal discomfort associated with continued nausea and vomiting and therefore a CT of her abdomen and pelvis was ordered. The CT was essentially negative except for a small cyst of her left ovary which may have also been responsible for some of her symptoms. This was communicated to the patient. At this time the patient is finally feeling improved, has not had any further nausea and vomiting for a couple of hours. If the patient is able to tolerate by mouth's, I feel comfortable discharging the patient home and she is [facility]. Plan would be to give her prescriptions for pain and nausea and doxycycline.  Bonnie Conrad. Bernadine Melecio, MD 06/27/13 1250

## 2013-06-27 NOTE — ED Notes (Signed)
I-Stat Lactic Acid reported to Dr.Otter. Ed-Lab

## 2013-06-27 NOTE — ED Notes (Addendum)
Pt in ultrasound and complaining of pain and vomiting. Notified EDp. Gave pt Zofran and Dilaudid in ultrasound.

## 2013-06-27 NOTE — ED Provider Notes (Signed)
CSN: [id number]     Arrival date & time 06/27/13  0019 History   First MD Initiated Contact with Patient 06/27/13 [phone]     Chief Complaint  Patient presents with  . Abdominal Pain     (Consider location/radiation/quality/duration/timing/severity/associated sxs/prior Treatment) HPI 43 yo female presents to the ER from home with complaint of 3-5 days of worsening abdominal pain.  No fevers.  Nausea but no vomiting.  Has had some discharge from her umbilicus, h/o tubal ligation in the past.  Pt reports LMP about 2.5 weeks ago.  No new sexual partners.  She has had mucous d/c from vagina for the last several days.  Pain LLQ, RLQ.  History is hampered by language barrier despite interpreter phone Past Medical History  Diagnosis Date  . Abdominal hernia   . Hernia of abdominal cavity 2009  . Depression     [facility]   Past Surgical History  Procedure Laterality Date  . Dilation and curettage of uterus    . Tubal ligation  07/25/2011    Procedure: POST PARTUM TUBAL LIGATION;  Surgeon: Mora Bellman, MD;  Location: [facility];  Service: Gynecology;  Laterality: Bilateral;   Family History  Problem Relation Age of Onset  . Hypertension Mother   . Anesthesia problems Neg Hx   . Hypotension Neg Hx   . Malignant hyperthermia Neg Hx   . Pseudochol deficiency Neg Hx    History  Substance Use Topics  . Smoking status: Never Smoker   . Smokeless tobacco: Never Used  . Alcohol Use: No   OB History   Grav Para Term Preterm Abortions TAB SAB Ect Mult Living   9 6 6  0 3 0 3 0 0 6     Review of Systems  Unable to perform ROS: Other   language barrier    Allergies  Review of patient's allergies indicates no known allergies.  Home Medications  No current outpatient prescriptions on file. BP 130/62  Pulse 77  Temp(Src) 98.5 F (36.9 C) (Oral)  Resp 14  Ht 5' (1.524 m)  Wt 102 lb (46.267 kg)  BMI 19.92 kg/m2  SpO2 97%  LMP 06/13/2013 Physical Exam   Nursing note and vitals reviewed. Constitutional: She is oriented to person, place, and time. She appears well-developed and well-nourished. She appears distressed (agitated, uncomfortable appearing).  HENT:  Head: Normocephalic and atraumatic.  Nose: Nose normal.  Dry mucous membranes  Eyes: Conjunctivae and EOM are normal. Pupils are equal, round, and reactive to light.  Neck: Normal range of motion. Neck supple. No JVD present. No tracheal deviation present. No thyromegaly present.  Cardiovascular: Normal rate, regular rhythm, normal heart sounds and intact distal pulses.  Exam reveals no gallop and no friction rub.   No murmur heard. Pulmonary/Chest: Effort normal and breath sounds normal. No stridor. No respiratory distress. She has no wheezes. She has no rales. She exhibits no tenderness.  Abdominal: Soft. Bowel sounds are normal. She exhibits no distension and no mass. There is no tenderness. There is no rebound and no guarding.  Umbilicus with incisional scar at inferior border.  There is a pocket of waxy white discharge that is nonpurulent in the cavity.  Just under the incision.  There is no fluctuance, erythema or warmth to the area.  Genitourinary:  External genitalia normal Vagina with purulent discharge Cervix closed no lesions Significant cervical motion tenderness Adnexa palpated, no masses , but bilateral  tenderness Bladder palpated no tenderness Uterus  palpated no masses , tenderness throughout    Musculoskeletal: Normal range of motion. She exhibits no edema and no tenderness.  Lymphadenopathy:    She has no cervical adenopathy.  Neurological: She is alert and oriented to person, place, and time. She exhibits normal muscle tone. Coordination normal.  Skin: Skin is warm and dry. No rash noted. No erythema. No pallor.  Psychiatric: She has a normal mood and affect. Her behavior is normal. Judgment and thought content normal.    ED Course  Procedures (including  critical care time) Labs Review Labs Reviewed  WET PREP, GENITAL - Abnormal; Notable for the following:    WBC, Wet Prep HPF POC TOO NUMEROUS TO COUNT (*)    All other components within normal limits  CBC WITH DIFFERENTIAL - Abnormal; Notable for the following:    RDW 16.1 (*)    All other components within normal limits  COMPREHENSIVE METABOLIC PANEL - Abnormal; Notable for the following:    Glucose, Bld 110 (*)    All other components within normal limits  URINALYSIS, ROUTINE W REFLEX MICROSCOPIC - Abnormal; Notable for the following:    Hgb urine dipstick SMALL (*)    All other components within normal limits  GC/CHLAMYDIA PROBE AMP  LIPASE, BLOOD  URINE MICROSCOPIC-ADD ON  POC URINE PREG, ED  I-STAT CG4 LACTIC ACID, ED   Imaging Review US Pelvis Complete  06/27/2013   CLINICAL DATA:  Pelvic pain  EXAM: TRANSABDOMINAL AND TRANSVAGINAL ULTRASOUND OF PELVIS  TECHNIQUE: Both transabdominal and transvaginal ultrasound examinations of the pelvis were performed. Transabdominal technique was performed for global imaging of the pelvis including uterus, ovaries, adnexal regions, and pelvic cul-de-sac. It was necessary to proceed with endovaginal exam following the transabdominal exam to visualize the ovaries and uterus.  COMPARISON:  07/30/2011  FINDINGS: Uterus  Measurements: 8.0 x 4.2 x 5.7 cm. Small cervical cysts. No fibroids or other mass visualized.  Endometrium  Thickness: 10.6 mm.  No focal abnormality visualized.  Right ovary  Measurements: 3.7 x 2.7 x 2.2 cm. Multiple follicles. Normal appearance/no adnexal mass.  Left ovary  Measurements: 2.2 x 1.5 x 1.7 cm. Negative for mass. 2 cm collapsing follicle with adjacent free fluid.  Other findings  Mild free fluid.  IMPRESSION: Small amount of free fluid.  Bilateral ovarian follicles. .  Small cervical cysts.  Otherwise normal uterus.   Electronically Signed   By: Franchot Gallo M.D.   On: 06/27/2013 06:13     EKG Interpretation None       MDM   Final diagnoses:  None    43 year old female with severe colic pain.  Concern for PID and possible tubo-ovarian abscess.  7:29 AM Patient has had persistent significant pain despite the law that and morphine.  Ultrasound without torsion or TOA noted.  There is some slight fluid in the pelvis.  Plan for CT scan for further delineation of infectious process.  Patient has not been febrile or had elevated white count, the pain seems out of proportion to findings thus far.  8:31 AM Care passed to Dr Dorna Mai awaiting results of CT scan.  Pt with persistent pain, vomiting, may need admission.   Kalman Drape, MD 06/27/13 [phone]

## 2013-06-27 NOTE — ED Notes (Signed)
Pt. reports worsening pain at mid abdomen and drainage at umbilical area onset 3 days ago , denies nausea or vomitting , no fever or chills.

## 2013-06-27 NOTE — ED Notes (Signed)
Pt is back from CT and on the monitor

## 2013-06-29 LAB — GC/CHLAMYDIA PROBE AMP
CT PROBE, AMP APTIMA: NEGATIVE
GC Probe RNA: NEGATIVE

## 2013-07-01 ENCOUNTER — Telehealth: Payer: Self-pay | Admitting: *Deleted

## 2013-07-01 NOTE — Telephone Encounter (Signed)
Pt was seen in [place] on Sunday 3/15 for PID. She states that she was told to call us for an appointment this week. Patient was advised that we don't have any open appts for tomorrow. Pt reports that she is having severe pelvic pain. I advised that she go to [facility] to be evaluated since pain is severe. Patient agrees with [place]. She was also scheduled by Godfrey Pick to come back to see [place] next week. Pt states that she has no further questions.

## 2013-07-08 ENCOUNTER — Encounter: Payer: Self-pay | Admitting: Obstetrics & Gynecology

## 2013-07-08 ENCOUNTER — Ambulatory Visit (INDEPENDENT_AMBULATORY_CARE_PROVIDER_SITE_OTHER): Payer: Self-pay | Admitting: Obstetrics & Gynecology

## 2013-07-08 VITALS — BP 126/69 | HR 68 | Temp 97.4°F | Ht 60.0 in | Wt 102.7 lb

## 2013-07-08 DIAGNOSIS — K469 Unspecified abdominal hernia without obstruction or gangrene: Secondary | ICD-10-CM

## 2013-07-08 DIAGNOSIS — N739 Female pelvic inflammatory disease, unspecified: Secondary | ICD-10-CM

## 2013-07-08 NOTE — Patient Instructions (Signed)
Regrese a la clinica cuando tenga su cita. Si tiene problemas o preguntas, llama a la clinica o vaya a la sala de emergencia al [facility].    

## 2013-07-08 NOTE — Progress Notes (Signed)
CLINIC ENCOUNTER NOTE  History:  43 y.o. [id number] here today for follow up for PID which was treated on 06/27/13.  Patient is Spanish-speaking only, Spanish interpreter present for this encounter.  Pain is improved, no other GYN symptoms.  The following portions of the patient's history were reviewed and updated as appropriate: allergies, current medications, past family history, past medical history, past social history, past surgical history and problem list. Normal pap in 03/06/11.    Review of Systems:  Pertinent items are noted in HPI.  Objective:  Physical Exam BP 126/69  Pulse 68  Temp(Src) 97.4 F (36.3 C) (Oral)  Ht 5' (1.524 m)  Wt 102 lb 11.2 oz (46.584 kg)  BMI 20.06 kg/m2  LMP 07/07/2013  Breastfeeding? No Gen: NAD Abd: Soft, mild left sided tenderness, small umbilical hernia noted. Pelvic:Deferred  Labs and Imaging 06/29/2013  TRANSABDOMINAL AND TRANSVAGINAL ULTRASOUND OF PELVIS CLINICAL DATA:  Pelvic pain  TECHNIQUE: Both transabdominal and transvaginal ultrasound examinations of the pelvis were performed. Transabdominal technique was performed for global imaging of the pelvis including uterus, ovaries, adnexal regions, and pelvic cul-de-sac. It was necessary to proceed with endovaginal exam following the transabdominal exam to visualize the ovaries and uterus. .  COMPARISON:  07/30/2011  FINDINGS: Uterus  Measurements: 8.0 x 4.2 x 5.7 cm. Small cervical cysts. . No fibroids or other mass visualized.  Endometrium  Thickness: 10.6 mm . No focal abnormality visualized.  Right ovary  Measurements: 3.7 x 2.7 x 2.2 cm. Multiple follicles . Normal appearance/no adnexal mass.  Left ovary  Measurements: 2.2 x 1.5 x 1.7 cm. . Negative for mass. 2 cm collapsing follicle with adjacent free fluid.  Other findings  Mild free fluid.  IMPRESSION: Small amount of free fluid. Bilateral ovarian follicles. .  Small cervical cysts. Otherwise normal uterus.   Electronically Signed   By: Franchot Gallo M.D.   On: 06/29/2013 14:24    06/27/2013   CT ABDOMEN AND PELVIS WITH CONTRAST CLINICAL DATA:  Left greater than right lower quadrant abdominal pain. Nausea. Discharge from umbilicus. Prior tubal ligation. TECHNIQUE: Multidetector CT imaging of the abdomen and pelvis was performed using the standard protocol following bolus administration of intravenous contrast.  CONTRAST:  124mL OMNIPAQUE IOHEXOL 300 MG/ML  SOLN  COMPARISON:  US TRANSVAGINAL NON-OB dated 06/27/2013; MR ABDOMEN W/O CM dated 04/10/2011  FINDINGS: Minimal subpleural dependent atelectasis is noted.  Liver, gallbladder, adrenal glands, spleen, pancreas, and left kidney are normal. Right lower renal pole cortical cyst measures 1 cm image 37, measuring 30 Hounsfield units, above that typically seen with low-density fluid but most likely due to volume averaging with adjacent renal parenchyma. No ascites, free air, or lymphadenopathy.  The bladder is normal. Left ovarian cyst incidentally noted image 58. Right ovary and uterus are normal. Normal appendix. No bowel wall thickening or focal segmental dilatation. Small ventral fat containing anterior abdominal hernia image 38. Small fat containing umbilical hernia. Rectus diastasis again noted. No intrapelvic fluid or lymphadenopathy. No acute osseous abnormality.  IMPRESSION: Anterior abdominal wall/ventral fat containing hernia and fat containing umbilical hernia.  No acute intra-abdominal or pelvic pathology.  Physiologic left ovarian cyst incidentally noted. This could correspond to the history of left lower quadrant abdominal pain.   Electronically Signed   By: Conchita Paris M.D.   On: 06/27/2013 11:16    Assessment & Plan:  Resolving PID; continue NSAIDs prn pain Follow up with General Surgery for any worsening hernia problems Pain precautions reviewed.  Verita Schneiders, MD, [facility] Attending [place], [place]

## 2013-07-12 ENCOUNTER — Encounter (HOSPITAL_COMMUNITY): Payer: Self-pay | Admitting: Emergency Medicine

## 2013-07-12 ENCOUNTER — Emergency Department (HOSPITAL_COMMUNITY): Payer: Self-pay

## 2013-07-12 ENCOUNTER — Emergency Department (HOSPITAL_COMMUNITY)
Admission: EM | Admit: 2013-07-12 | Discharge: 2013-07-12 | Disposition: A | Discharge status: Home / Self Care | Payer: Self-pay | Attending: Emergency Medicine | Admitting: Emergency Medicine

## 2013-07-12 DIAGNOSIS — Z9851 Tubal ligation status: Secondary | ICD-10-CM | POA: Insufficient documentation

## 2013-07-12 DIAGNOSIS — R112 Nausea with vomiting, unspecified: Secondary | ICD-10-CM | POA: Insufficient documentation

## 2013-07-12 DIAGNOSIS — R300 Dysuria: Secondary | ICD-10-CM | POA: Insufficient documentation

## 2013-07-12 DIAGNOSIS — R0789 Other chest pain: Secondary | ICD-10-CM | POA: Insufficient documentation

## 2013-07-12 DIAGNOSIS — R109 Unspecified abdominal pain: Secondary | ICD-10-CM | POA: Insufficient documentation

## 2013-07-12 DIAGNOSIS — Z8659 Personal history of other mental and behavioral disorders: Secondary | ICD-10-CM | POA: Insufficient documentation

## 2013-07-12 DIAGNOSIS — Z8719 Personal history of other diseases of the digestive system: Secondary | ICD-10-CM | POA: Insufficient documentation

## 2013-07-12 DIAGNOSIS — Z3202 Encounter for pregnancy test, result negative: Secondary | ICD-10-CM | POA: Insufficient documentation

## 2013-07-12 DIAGNOSIS — Z792 Long term (current) use of antibiotics: Secondary | ICD-10-CM | POA: Insufficient documentation

## 2013-07-12 DIAGNOSIS — N898 Other specified noninflammatory disorders of vagina: Secondary | ICD-10-CM | POA: Insufficient documentation

## 2013-07-12 LAB — CBC WITH DIFFERENTIAL/PLATELET
BASOS PCT: 0 % (ref 0–1)
Basophils Absolute: 0 10*3/uL (ref 0.0–0.1)
EOS ABS: 0.1 10*3/uL (ref 0.0–0.7)
EOS PCT: 1 % (ref 0–5)
HEMATOCRIT: 34.9 % — AB (ref 36.0–46.0)
HEMOGLOBIN: 11.3 g/dL — AB (ref 12.0–15.0)
LYMPHS ABS: 1.8 10*3/uL (ref 0.7–4.0)
Lymphocytes Relative: 19 % (ref 12–46)
MCH: 28.8 pg (ref 26.0–34.0)
MCHC: 32.4 g/dL (ref 30.0–36.0)
MCV: 88.8 fL (ref 78.0–100.0)
MONO ABS: 0.5 10*3/uL (ref 0.1–1.0)
Monocytes Relative: 5 % (ref 3–12)
Neutro Abs: 7.5 10*3/uL (ref 1.7–7.7)
Neutrophils Relative %: 76 % (ref 43–77)
Platelets: 238 10*3/uL (ref 150–400)
RBC: 3.93 MIL/uL (ref 3.87–5.11)
RDW: 15.9 % — ABNORMAL HIGH (ref 11.5–15.5)
WBC: 9.9 10*3/uL (ref 4.0–10.5)

## 2013-07-12 LAB — COMPREHENSIVE METABOLIC PANEL
ALBUMIN: 4.1 g/dL (ref 3.5–5.2)
ALK PHOS: 60 U/L (ref 39–117)
ALT: 13 U/L (ref 0–35)
AST: 17 U/L (ref 0–37)
BILIRUBIN TOTAL: 0.3 mg/dL (ref 0.3–1.2)
BUN: 17 mg/dL (ref 6–23)
CO2: 22 meq/L (ref 19–32)
CREATININE: 0.65 mg/dL (ref 0.50–1.10)
Calcium: 8.9 mg/dL (ref 8.4–10.5)
Chloride: 106 mEq/L (ref 96–112)
GFR calc Af Amer: >90 mL/min (ref >90)
GFR calc non Af Amer: >90 mL/min (ref >90)
Glucose, Bld: 104 mg/dL — ABNORMAL HIGH (ref 70–99)
Potassium: 3.9 mEq/L (ref 3.7–5.3)
SODIUM: 141 meq/L (ref 137–147)
Total Protein: 7.4 g/dL (ref 6.0–8.3)

## 2013-07-12 LAB — URINALYSIS, ROUTINE W REFLEX MICROSCOPIC
Bilirubin Urine: NEGATIVE
Glucose, UA: NEGATIVE mg/dL
Ketones, ur: NEGATIVE mg/dL
Leukocytes, UA: NEGATIVE
Nitrite: NEGATIVE
Protein, ur: NEGATIVE mg/dL
Specific Gravity, Urine: 1.014 (ref 1.005–1.030)
Urobilinogen, UA: 0.2 mg/dL (ref 0.0–1.0)
pH: 7 (ref 5.0–8.0)

## 2013-07-12 LAB — WET PREP, GENITAL
CLUE CELLS WET PREP: NONE SEEN
Trich, Wet Prep: NONE SEEN
YEAST WET PREP: NONE SEEN

## 2013-07-12 LAB — POC URINE PREG, ED: Preg Test, Ur: NEGATIVE

## 2013-07-12 LAB — URINE MICROSCOPIC-ADD ON

## 2013-07-12 LAB — GC/CHLAMYDIA PROBE AMP
CT PROBE, AMP APTIMA: NEGATIVE
GC Probe RNA: NEGATIVE

## 2013-07-12 LAB — LIPASE, BLOOD: Lipase: 32 U/L (ref 11–59)

## 2013-07-12 LAB — I-STAT CG4 LACTIC ACID, ED: Lactic Acid, Venous: 0.79 mmol/L (ref 0.5–2.2)

## 2013-07-12 MED ORDER — HYDROMORPHONE HCL PF 1 MG/ML IJ SOLN
1.0000 mg | Freq: Once | INTRAMUSCULAR | Status: AC | PRN
  Administered 2013-07-12: 1 mg via INTRAVENOUS
  Filled 2013-07-12: qty 1

## 2013-07-12 MED ORDER — SODIUM CHLORIDE 0.9 % IV BOLUS (SEPSIS)
1000.0000 mL | Freq: Once | INTRAVENOUS | Status: AC
  Administered 2013-07-12: 1000 mL via INTRAVENOUS

## 2013-07-12 MED ORDER — MORPHINE SULFATE 4 MG/ML IJ SOLN
4.0000 mg | Freq: Once | INTRAMUSCULAR | Status: AC
Start: 2013-07-12 — End: 2013-07-12
  Administered 2013-07-12: 4 mg via INTRAVENOUS
  Filled 2013-07-12: qty 1

## 2013-07-12 MED ORDER — PROMETHAZINE HCL 25 MG/ML IJ SOLN
12.5000 mg | Freq: Once | INTRAMUSCULAR | Status: AC
  Administered 2013-07-12: 12.5 mg via INTRAVENOUS
  Filled 2013-07-12: qty 1

## 2013-07-12 MED ORDER — HYDROCODONE-ACETAMINOPHEN 5-325 MG PO TABS
1.0000 | ORAL_TABLET | Freq: Four times a day (QID) | ORAL | Status: DC | PRN
Start: 2013-07-12 — End: 2013-09-23

## 2013-07-12 MED ORDER — PROMETHAZINE HCL 25 MG PO TABS: 25.0000 mg | ORAL_TABLET | Freq: Three times a day (TID) | ORAL | Status: DC | PRN

## 2013-07-12 NOTE — ED Notes (Signed)
Per EMS pt complaining of upper abdominal pain, tender to palpate, constant pain 9/10, gave 150 mcg fentanyl, was nauseous and vomited x 2 at home, given 4mg  Zofran, [facility], diagnosed w/ hernia, HR 110, 20G L AC.

## 2013-07-12 NOTE — ED Provider Notes (Signed)
  Physical Exam  BP 129/58  Pulse 75  Temp(Src) 98.4 F (36.9 C) (Oral)  Resp 20  SpO2 98%  LMP 07/07/2013  Physical Exam  ED Course  Procedures  Patient is advised of her findings and will be referred to GI.  Told to return here as needed.  Patient has been stable here in the emergency department.      Brent General, PA-C 07/12/13 [phone]

## 2013-07-12 NOTE — ED Notes (Signed)
US at bedside

## 2013-07-12 NOTE — ED Provider Notes (Signed)
CSN: [id number]     Arrival date & time 07/12/13  5638 History   First MD Initiated Contact with Patient 07/12/13 0413     Chief Complaint  Patient presents with  . Abdominal Pain     (Consider location/radiation/quality/duration/timing/severity/associated sxs/prior Treatment) HPI Comments: 43 year old female with a history of abdominal hernia and SHx of tubal ligation in 2013 presents to the emergency department today via EMS for abdominal pain. Patient states that pain began at 11 PM yesterday evening and has been constant since this time. Pain is nonradiating and worse with palpation to the area. Patient states that pain is primarily present in her supraumbilical region. She states that she thinks her pain is secondary to her hernia. Patient did not take anything for her symptoms prior to EMS arrival. She was given 150 mcg fentanyl en route without improvement.   Patient endorses associated nausea and 4 episodes of nonbloody/nonbilious emesis. Patient also states that when the pain in her abdomen worsens she feels a discomfort in her chest and feels mildly short of breath. She states when she voided most recently she felt like it "burned a little". She also noticed a small amount of blood which she appreciated to be from her vaginal canal. LMP 07/07/13. Patient recently completed course of abx for PID, diagnosed after ED evaluation on 06/27/13. Last BM was yesterday morning and was normal, per patient.  Patient is a 43 y.o. female presenting with abdominal pain. The history is provided by the patient. The history is limited by a language barrier. A language interpreter was used Loss adjuster, chartered translating at bedside).  Abdominal Pain Associated symptoms: dysuria, nausea, vaginal bleeding and vomiting   Associated symptoms: no chest pain, no constipation, no diarrhea, no fever, no shortness of breath and no vaginal discharge     Past Medical History  Diagnosis Date  . Abdominal hernia   . Hernia of  abdominal cavity 2009  . Depression     [facility]   Past Surgical History  Procedure Laterality Date  . Dilation and curettage of uterus    . Tubal ligation  07/25/2011    Procedure: POST PARTUM TUBAL LIGATION;  Surgeon: Mora Bellman, MD;  Location: [facility];  Service: Gynecology;  Laterality: Bilateral;   Family History  Problem Relation Age of Onset  . Hypertension Mother   . Anesthesia problems Neg Hx   . Hypotension Neg Hx   . Malignant hyperthermia Neg Hx   . Pseudochol deficiency Neg Hx    History  Substance Use Topics  . Smoking status: Never Smoker   . Smokeless tobacco: Never Used  . Alcohol Use: No   OB History   Grav Para Term Preterm Abortions TAB SAB Ect Mult Living   9 6 6  0 3 0 3 0 0 6     Review of Systems  Constitutional: Negative for fever.       "feels hot"  Respiratory: Positive for chest tightness. Negative for shortness of breath.   Cardiovascular: Negative for chest pain.  Gastrointestinal: Positive for nausea, vomiting and abdominal pain. Negative for diarrhea, constipation and blood in stool.  Genitourinary: Positive for dysuria and vaginal bleeding. Negative for vaginal discharge.  Neurological: Negative for syncope.  All other systems reviewed and are negative.      Allergies  Review of patient's allergies indicates no known allergies.  Home Medications   Current Outpatient Rx  Name  Route  Sig  Dispense  Refill  . aspirin-acetaminophen-caffeine (  EXCEDRIN MIGRAINE) [phone] MG per tablet   Oral   Take 2 tablets by mouth every 6 (six) hours as needed for headache (pain).         Marland Kitchen doxycycline (VIBRAMYCIN) 100 MG capsule   Oral   Take 1 capsule (100 mg total) by mouth 2 (two) times daily.   20 capsule   0    BP 129/71  Pulse 95  Temp(Src) 98.5 F (36.9 C) (Oral)  Resp 19  SpO2 97%  LMP 07/07/2013  Physical Exam  Nursing note and vitals reviewed. Constitutional: She is oriented to person,  place, and time. She appears well-developed and well-nourished. No distress.  Patient appears mildly uncomfortable.  HENT:  Head: Normocephalic and atraumatic.  Mouth/Throat: Oropharynx is clear and moist. No oropharyngeal exudate.  Eyes: Conjunctivae and EOM are normal. No scleral icterus.  Neck: Normal range of motion.  Cardiovascular: Normal rate, regular rhythm and normal heart sounds.   Pulmonary/Chest: Effort normal and breath sounds normal. No respiratory distress. She has no wheezes. She has no rales.  Abdominal: Soft. She exhibits no distension and no mass. Bowel sounds are increased. There is no rebound and no guarding.    TTP mostly generalized with a few areas of more localized/focal tenderness. No peritoneal signs. Abdomen soft. Bowel sounds hyperactive. No hard palpable mass or area of erythema, swelling, or induration to abdominal wall.  Musculoskeletal: Normal range of motion.  Neurological: She is alert and oriented to person, place, and time.  Skin: Skin is warm and dry. No rash noted. She is not diaphoretic. No erythema. No pallor.  Psychiatric: She has a normal mood and affect. Her behavior is normal.    ED Course  Procedures (including critical care time) Labs Review Labs Reviewed  WET PREP, GENITAL - Abnormal; Notable for the following:    WBC, Wet Prep HPF POC FEW (*)    All other components within normal limits  URINALYSIS, ROUTINE W REFLEX MICROSCOPIC - Abnormal; Notable for the following:    Hgb urine dipstick SMALL (*)    All other components within normal limits  CBC WITH DIFFERENTIAL - Abnormal; Notable for the following:    Hemoglobin 11.3 (*)    HCT 34.9 (*)    RDW 15.9 (*)    All other components within normal limits  COMPREHENSIVE METABOLIC PANEL - Abnormal; Notable for the following:    Glucose, Bld 104 (*)    All other components within normal limits  URINE MICROSCOPIC-ADD ON - Abnormal; Notable for the following:    Squamous Epithelial / LPF  FEW (*)    All other components within normal limits  GC/CHLAMYDIA PROBE AMP  LIPASE, BLOOD  POC URINE PREG, ED  I-STAT CG4 LACTIC ACID, ED   Imaging Review No results found.   EKG Interpretation None      MDM   Final diagnoses:  Abdominal pain    Patient presents for abdominal pain onset 11PM yesterday evening. Symptoms associated with N/V x 4. Normal BM yesterday. Patient recently seen and d/c'd with dx of PID on 06/27/13 with reassuring f/u with OBGYN on 07/08/13. Today patietn without fever. She is hemodynamically stable and nontoxic/nonseptic appearing. Abdominal exam with generalized TTP with mild areas of more localized tenderness to deep palpation. No masses or evidence of peritonitis. Abdomen soft.  Patient has no leukocytosis today. Lactate normal. Labs, in totality, offer no evidence to suggest emergent/acute abdominopelvic process. Abd U/S pending.   Patient care assumed by Dalia Heading, PA-C at  shift change. Plan to d/c with pain medicine and GI referral if imaging unremarkable.   Filed Vitals:   07/12/13 0339  BP: 129/71  Pulse: 95  Temp: 98.5 F (36.9 C)  TempSrc: Oral  Resp: 19  SpO2: 97%       Antonietta Breach, PA-C 07/12/13 [phone]

## 2013-07-12 NOTE — ED Provider Notes (Signed)
Dr Dorna Mai signed out pt at 0730 that if u/s neg, d/c to home.  U/s neg acute. Recheck pt comfortable. No nv. abd soft nt.  Will d/c home per above plan, suggested close pcp follow up.    Mirna Mires, MD 07/12/13 [phone]

## 2013-07-12 NOTE — ED Notes (Signed)
Bed: WA10 Expected date:  Expected time:  Means of arrival:  Comments: EMS F abd pain 

## 2013-07-12 NOTE — ED Notes (Signed)
Pt having abdominal pain w/ n/v present

## 2013-07-12 NOTE — Discharge Instructions (Signed)
Return here for any worsening in your condition.  Followup with the, GI Dr. provided.  Your testing here today did not show any significant abnormality

## 2013-07-12 NOTE — ED Provider Notes (Signed)
Medical screening examination/treatment/procedure(s) were performed by non-physician practitioner and as supervising physician I was immediately available for consultation/collaboration.   EKG Interpretation None        Saddie Benders. Dorna Mai, MD 07/12/13 [phone]

## 2013-08-13 IMAGING — US US OB DETAIL+14 WK
1 series · 12 of 28 positions shown · non-contrast
Comparison: none

[Series 1: us ob detail +14 wk · 54 acquisitions, 12 frames shown]
[im 2/54]
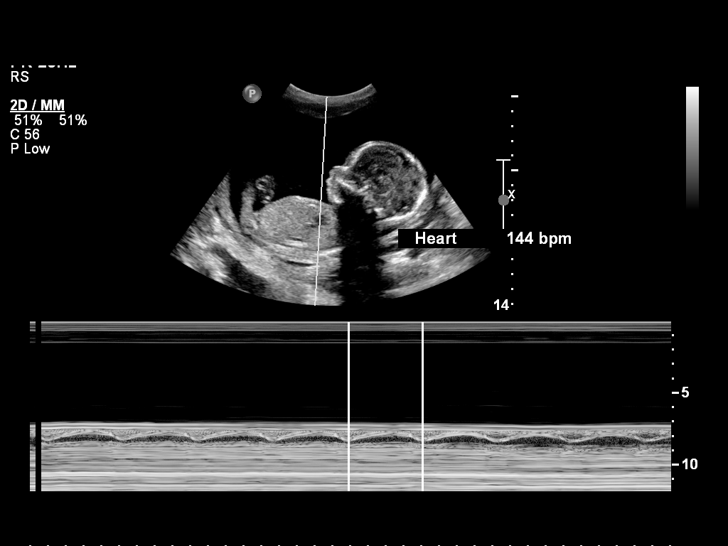
[im 6/54]
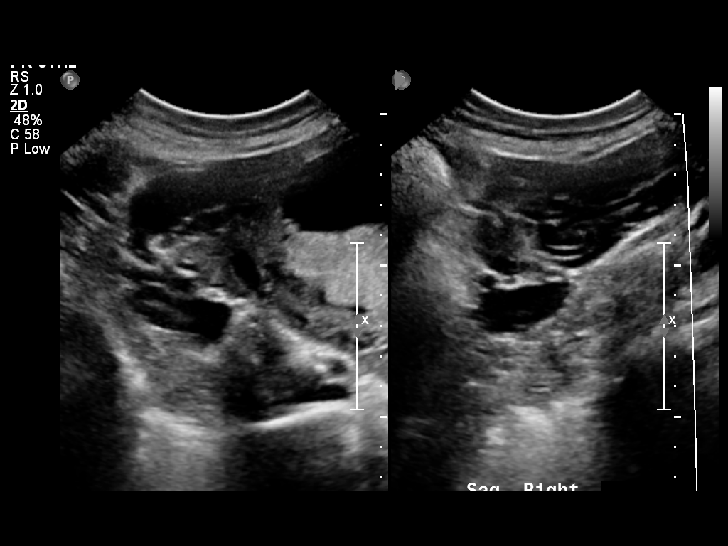
[im 10/54]
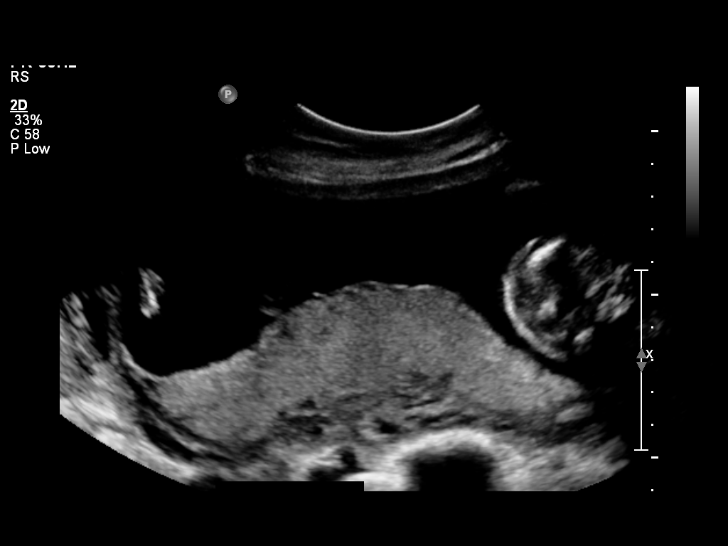
[im 16/54]
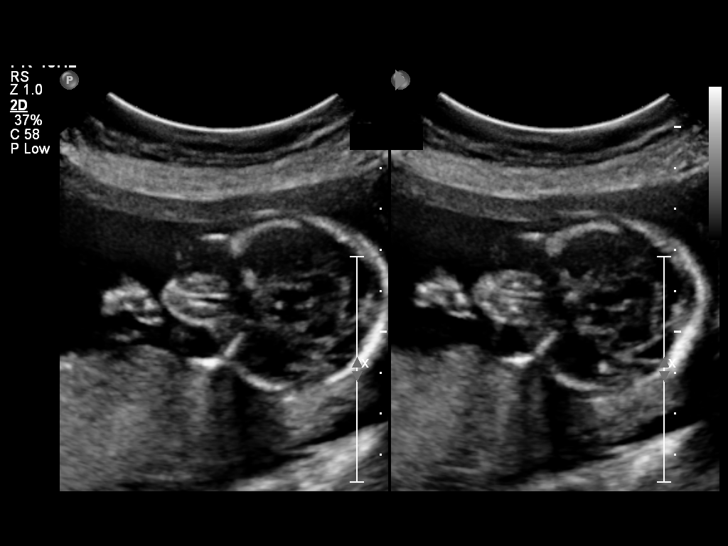
[im 20/54]
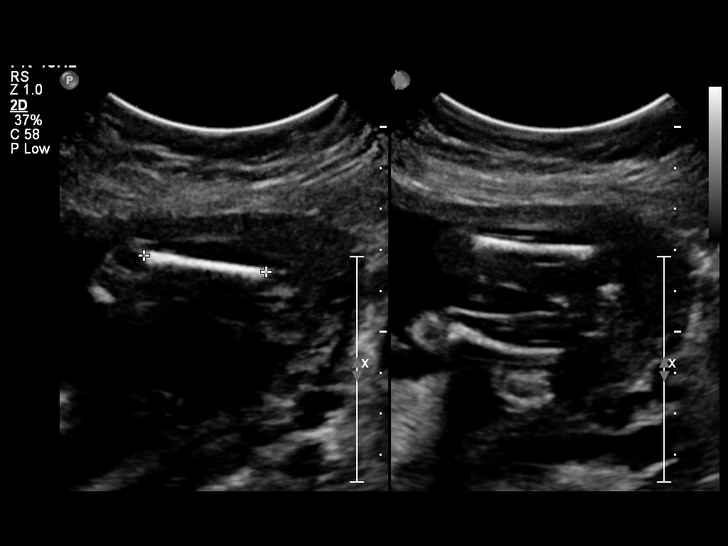
[im 24/54]
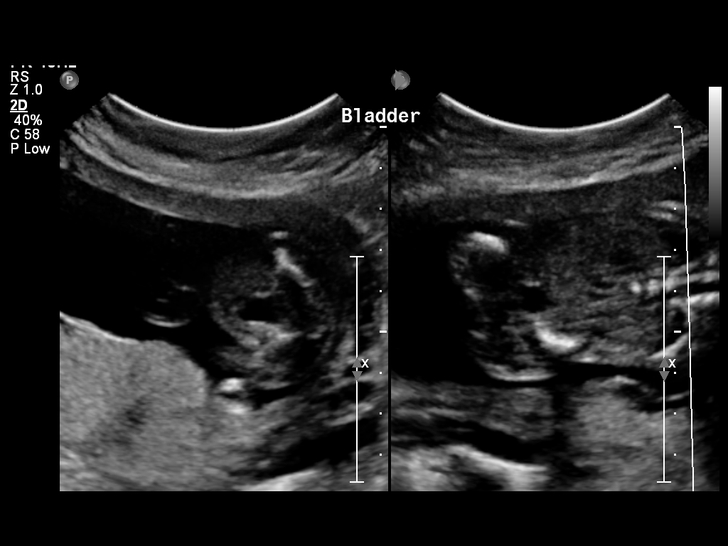
[im 30/54]
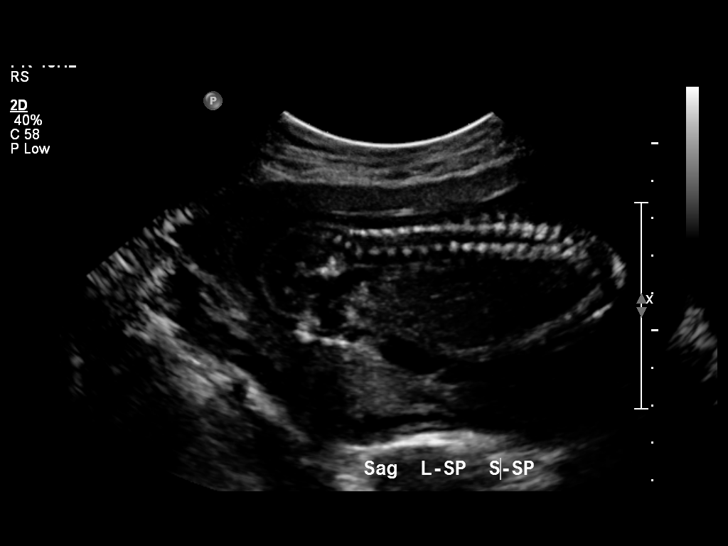
[im 34/54]
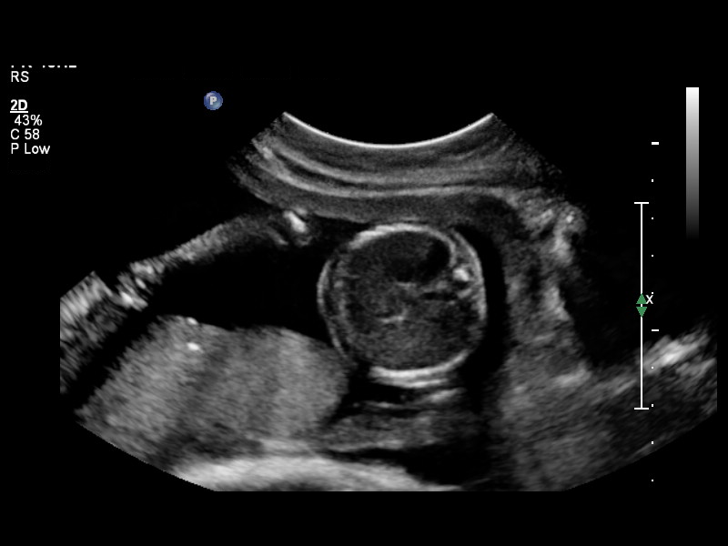
[im 38/54]
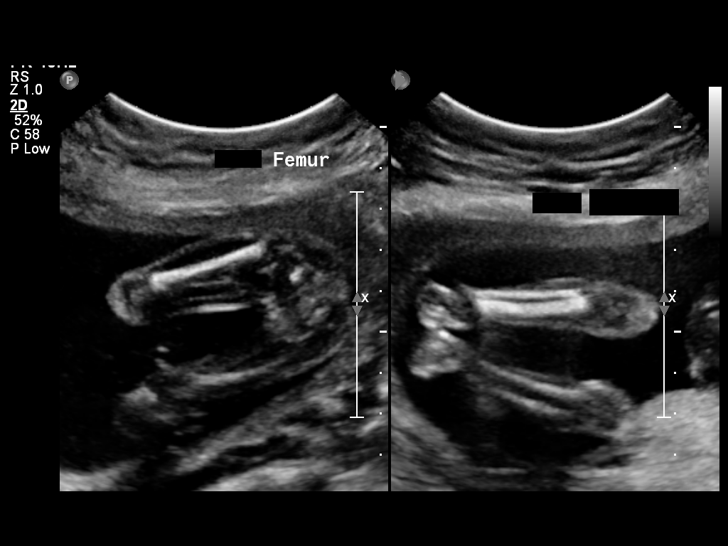
[im 44/54]
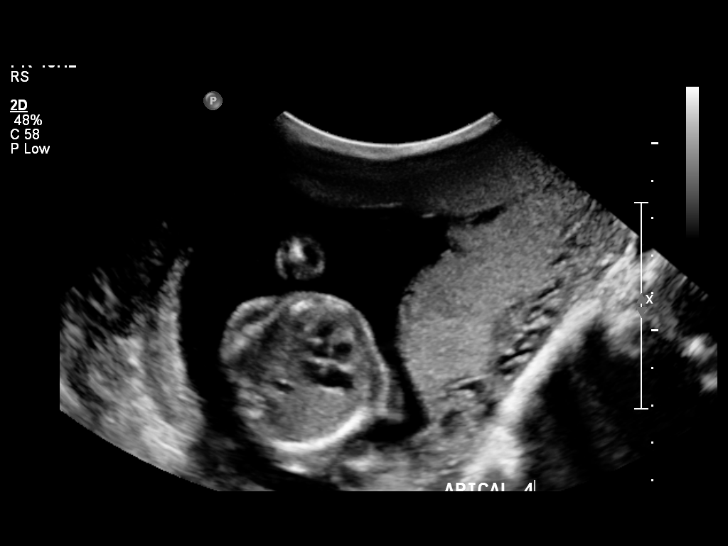
[im 48/54]
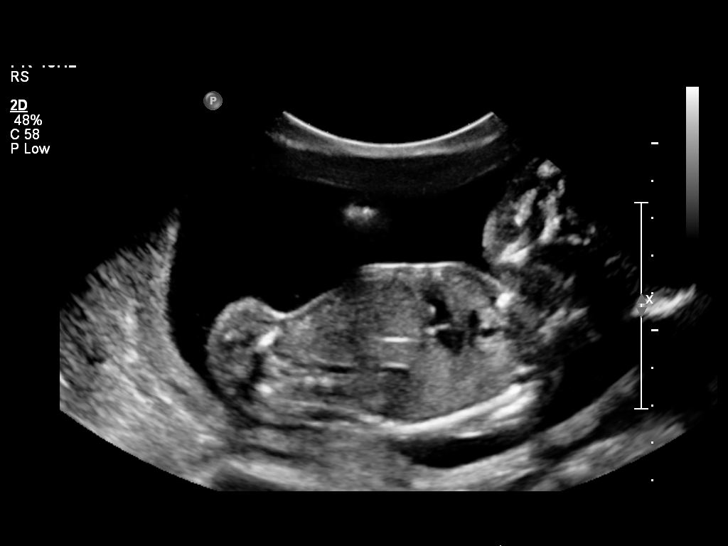
[im 52/54]
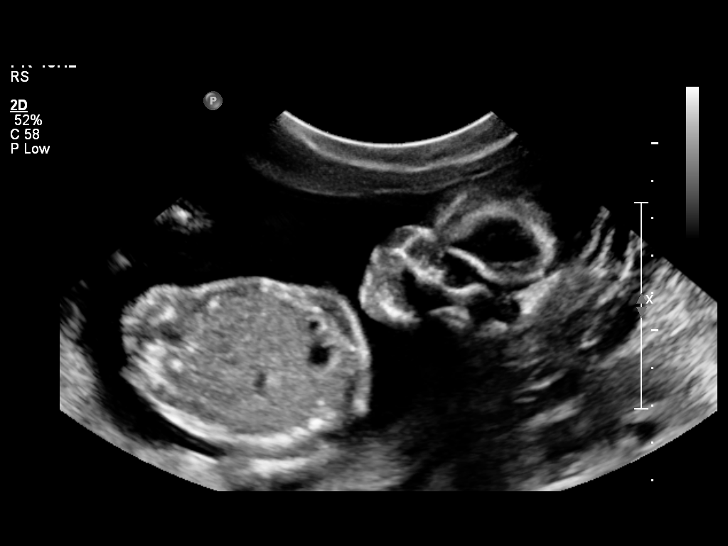

[12 of 28 positions shown; findings below may reference images not displayed]

OBSTETRICS REPORT
                      (Signed Final 03/13/2011 [DATE])

 Order#:         [id number]
Procedures

 US OB DETAIL + 14 WK                                  76811.0
Indications

 Detailed fetal anatomic survey
 Advanced maternal age (AMA), Multigravida
Fetal Evaluation

 Fetal Heart Rate:  144                         bpm
 Cardiac Activity:  Observed
 Presentation:      Transverse, head to
                    maternal left
 Placenta:          Posterior, above cervical
                    os
 P. Cord            Visualized
 Insertion:

 Amniotic Fluid
 AFI FV:      Subjectively within normal limits
                                             Larg Pckt:     4.7  cm
Biometry

 BPD:     44.6  mm    G. Age:   19w 3d                CI:        71.18   70 - 86
                                                      FL/HC:      17.8   16.8 -

 HC:     168.4  mm    G. Age:   19w 4d       39  %    HC/AC:      1.24   1.09 -

 AC:     136.2  mm    G. Age:   19w 0d       29  %    FL/BPD:
 FL:        30  mm    G. Age:   19w 2d       32  %    FL/AC:      22.0   20 - 24
 HUM:     28.4  mm    G. Age:   19w 1d       42  %
 NFT:     2.77  mm
 Est. FW:     279  gm    0 lb 10 oz      40  %
Gestational Age

 LMP:           19w 4d       Date:   10/27/10                 EDD:   08/03/11
 U/S Today:     19w 2d                                        EDD:   08/05/11
 Best:          19w 4d    Det. By:   LMP  (10/27/10)          EDD:   08/03/11
Genetic Sonogram - Trisomy 21 Screening

 Age:                                             40          Risk=1:   113
 Echogenic bowel:                                 No          LR :
 Choroid plexus cysts:                            No
 Structural anomalies (inc. cardiac):             No          LR :
 Hypoplastic / absent midphalanx 5th Digit:       No
 Short femur:                                     No          LR :
 Wide space 7st-2nd toes:                         No
 Short humerus:                                   No          LR :
 2-vessel umbilical cord:                         Yes
 Pyelectasis:                                     No          LR :
 Echogenic cardiac foci:                          Yes         LR :
 Nuchal fold thickening >= 6 mm:                  No          LR :

 11 Of 11 Criteria Were Visualized and 2 Abnormal(s) Were Seen.
 Ultrasound Modified Risk for Fetal Down Syndrome = [DATE]
Anatomy

 Cranium:           Appears normal      Aortic Arch:       Appears normal
 Fetal Cavum:       Appears normal      Ductal Arch:       Appears normal
 Ventricles:        Appears normal      Diaphragm:         Appears normal
 Choroid Plexus:    Appears normal      Stomach:           Appears
                                                           normal, left
                                                           sided
 Cerebellum:        Appears normal      Abdomen:           Appears normal
 Posterior Fossa:   Appears normal      Abdominal Wall:    Appears nml
                                                           (cord insert,
                                                           abd wall)
 Nuchal Fold:       Appears normal      Cord Vessels:      2 Vessel Cord
                    (neck, nuchal
                    fold)
 Face:              Lips and orbits     Kidneys:           Appear normal
                    appear normal
 Heart:             Echogenic           Bladder:           Appears normal
                    focus in LV
 RVOT:              Appears normal      Spine:             Appears normal
 LVOT:              Appears normal      Limbs:             Appears normal
                                                           (hands, ankles,
                                                           feet)

 Other:     Female gender. Heels and 5th digit visualized.
Cervix Uterus Adnexa

 Cervical Length:   3.33      cm

 Cervix:       Closed.

 Adnexa:     No abnormality visualized.
Impression

 Single living IUP with assigned GA of 19w 4d.
 Echogenic focus in Left Ventricle and 2-vessel umbilical cord
 are seen.See US Modified Risk for Trisomy 21 above.
 Otherwise, fetal anatomy appears normal.
 Normal amniotic fluid volume and cervical length.

## 2013-09-22 ENCOUNTER — Emergency Department (HOSPITAL_COMMUNITY)
Admission: EM | Admit: 2013-09-22 | Discharge: 2013-09-23 | Disposition: A | Discharge status: Home / Self Care | Payer: Self-pay | Attending: Emergency Medicine | Admitting: Emergency Medicine

## 2013-09-22 DIAGNOSIS — N739 Female pelvic inflammatory disease, unspecified: Secondary | ICD-10-CM | POA: Insufficient documentation

## 2013-09-22 DIAGNOSIS — R143 Flatulence: Secondary | ICD-10-CM

## 2013-09-22 DIAGNOSIS — R141 Gas pain: Secondary | ICD-10-CM | POA: Insufficient documentation

## 2013-09-22 DIAGNOSIS — N730 Acute parametritis and pelvic cellulitis: Secondary | ICD-10-CM

## 2013-09-22 DIAGNOSIS — R142 Eructation: Secondary | ICD-10-CM

## 2013-09-22 DIAGNOSIS — IMO0001 Reserved for inherently not codable concepts without codable children: Secondary | ICD-10-CM

## 2013-09-23 ENCOUNTER — Emergency Department (HOSPITAL_COMMUNITY): Payer: Self-pay

## 2013-09-23 ENCOUNTER — Encounter (HOSPITAL_COMMUNITY): Payer: Self-pay | Admitting: Emergency Medicine

## 2013-09-23 LAB — WET PREP, GENITAL
Clue Cells Wet Prep HPF POC: NONE SEEN
Trich, Wet Prep: NONE SEEN
Yeast Wet Prep HPF POC: NONE SEEN

## 2013-09-23 LAB — URINALYSIS, ROUTINE W REFLEX MICROSCOPIC
Bilirubin Urine: NEGATIVE
Glucose, UA: NEGATIVE mg/dL
Ketones, ur: NEGATIVE mg/dL
Leukocytes, UA: NEGATIVE
NITRITE: NEGATIVE
PROTEIN: NEGATIVE mg/dL
SPECIFIC GRAVITY, URINE: 1.015 (ref 1.005–1.030)
UROBILINOGEN UA: 1 mg/dL (ref 0.0–1.0)
pH: 7.5 (ref 5.0–8.0)

## 2013-09-23 LAB — URINE MICROSCOPIC-ADD ON

## 2013-09-23 LAB — COMPREHENSIVE METABOLIC PANEL
ALBUMIN: 4.2 g/dL (ref 3.5–5.2)
ALT: 11 U/L (ref 0–35)
AST: 13 U/L (ref 0–37)
Alkaline Phosphatase: 70 U/L (ref 39–117)
BUN: 17 mg/dL (ref 6–23)
CALCIUM: 9.8 mg/dL (ref 8.4–10.5)
CO2: 25 mEq/L (ref 19–32)
CREATININE: 0.75 mg/dL (ref 0.50–1.10)
Chloride: 100 mEq/L (ref 96–112)
GFR calc non Af Amer: >90 mL/min (ref >90)
GLUCOSE: 103 mg/dL — AB (ref 70–99)
Potassium: 3.8 mEq/L (ref 3.7–5.3)
Sodium: 138 mEq/L (ref 137–147)
TOTAL PROTEIN: 7.7 g/dL (ref 6.0–8.3)
Total Bilirubin: 0.4 mg/dL (ref 0.3–1.2)

## 2013-09-23 LAB — CBC WITH DIFFERENTIAL/PLATELET
BASOS PCT: 0 % (ref 0–1)
Basophils Absolute: 0 10*3/uL (ref 0.0–0.1)
EOS PCT: 5 % (ref 0–5)
Eosinophils Absolute: 0.4 10*3/uL (ref 0.0–0.7)
HCT: 34.6 % — ABNORMAL LOW (ref 36.0–46.0)
HEMOGLOBIN: 11.7 g/dL — AB (ref 12.0–15.0)
LYMPHS ABS: 3 10*3/uL (ref 0.7–4.0)
Lymphocytes Relative: 37 % (ref 12–46)
MCH: 29.3 pg (ref 26.0–34.0)
MCHC: 33.8 g/dL (ref 30.0–36.0)
MCV: 86.7 fL (ref 78.0–100.0)
MONOS PCT: 7 % (ref 3–12)
Monocytes Absolute: 0.6 10*3/uL (ref 0.1–1.0)
NEUTROS PCT: 51 % (ref 43–77)
Neutro Abs: 4.2 10*3/uL (ref 1.7–7.7)
PLATELETS: 287 10*3/uL (ref 150–400)
RBC: 3.99 MIL/uL (ref 3.87–5.11)
RDW: 16.7 % — ABNORMAL HIGH (ref 11.5–15.5)
WBC: 8.1 10*3/uL (ref 4.0–10.5)

## 2013-09-23 LAB — LIPASE, BLOOD: Lipase: 27 U/L (ref 11–59)

## 2013-09-23 LAB — POC URINE PREG, ED: PREG TEST UR: NEGATIVE

## 2013-09-23 MED ORDER — DOXYCYCLINE HYCLATE 100 MG PO CAPS: 100.0000 mg | ORAL_CAPSULE | Freq: Two times a day (BID) | ORAL | Status: DC

## 2013-09-23 MED ORDER — MORPHINE SULFATE 4 MG/ML IJ SOLN
4.0000 mg | Freq: Once | INTRAMUSCULAR | Status: AC
  Administered 2013-09-23: 4 mg via INTRAVENOUS
  Filled 2013-09-23: qty 1

## 2013-09-23 MED ORDER — KETOROLAC TROMETHAMINE 30 MG/ML IJ SOLN
30.0000 mg | Freq: Once | INTRAMUSCULAR | Status: AC
  Administered 2013-09-23: 30 mg via INTRAVENOUS
  Filled 2013-09-23: qty 1

## 2013-09-23 MED ORDER — CEFTRIAXONE SODIUM 250 MG IJ SOLR
250.0000 mg | Freq: Once | INTRAMUSCULAR | Status: AC
  Administered 2013-09-23: 250 mg via INTRAMUSCULAR
  Filled 2013-09-23: qty 250

## 2013-09-23 MED ORDER — LIDOCAINE HCL 1 % IJ SOLN
INTRAMUSCULAR | Status: AC
  Administered 2013-09-23: 20 mL
  Filled 2013-09-23: qty 20

## 2013-09-23 MED ORDER — HYDROCODONE-ACETAMINOPHEN 5-325 MG PO TABS: 1.0000 | ORAL_TABLET | ORAL | Status: DC | PRN

## 2013-09-23 MED ORDER — AZITHROMYCIN 1 G PO PACK
1.0000 g | PACK | Freq: Once | ORAL | Status: AC
  Administered 2013-09-23: 1 g via ORAL
  Filled 2013-09-23: qty 1

## 2013-09-23 MED ORDER — DICYCLOMINE HCL 10 MG/ML IM SOLN
20.0000 mg | Freq: Once | INTRAMUSCULAR | Status: AC
  Administered 2013-09-23: 20 mg via INTRAMUSCULAR
  Filled 2013-09-23: qty 2

## 2013-09-23 NOTE — ED Provider Notes (Signed)
CSN: [id number]     Arrival date & time 09/22/13  2353 History   First MD Initiated Contact with Patient 09/23/13 [phone]     Chief Complaint  Patient presents with  . Abdominal Pain     (Consider location/radiation/quality/duration/timing/severity/associated sxs/prior Treatment) Patient is a 43 y.o. female presenting with abdominal pain. The history is provided by the patient. A language interpreter was used.  Abdominal Pain Pain location:  Generalized Pain quality: sharp   Pain radiates to:  Does not radiate Pain severity:  Severe Onset quality:  Sudden Timing:  Constant Progression:  Unchanged Chronicity:  Recurrent Context: not trauma   Relieved by:  Nothing Worsened by:  Nothing tried Ineffective treatments:  None tried Associated symptoms: no vomiting     Past Medical History  Diagnosis Date  . Abdominal hernia   . Hernia of abdominal cavity 2009  . Depression     [facility]   Past Surgical History  Procedure Laterality Date  . Dilation and curettage of uterus    . Tubal ligation  07/25/2011    Procedure: POST PARTUM TUBAL LIGATION;  Surgeon: Mora Bellman, MD;  Location: [facility];  Service: Gynecology;  Laterality: Bilateral;   Family History  Problem Relation Age of Onset  . Hypertension Mother   . Anesthesia problems Neg Hx   . Hypotension Neg Hx   . Malignant hyperthermia Neg Hx   . Pseudochol deficiency Neg Hx    History  Substance Use Topics  . Smoking status: Never Smoker   . Smokeless tobacco: Never Used  . Alcohol Use: No   OB History   Grav Para Term Preterm Abortions TAB SAB Ect Mult Living   9 6 6  0 3 0 3 0 0 6     Review of Systems  Gastrointestinal: Positive for abdominal pain. Negative for vomiting.  All other systems reviewed and are negative.     Allergies  Review of patient's allergies indicates no known allergies.  Home Medications   Prior to Admission medications   Medication Sig Start Date End  Date Taking? Authorizing Provider  ibuprofen (ADVIL,MOTRIN) 200 MG tablet Take 600 mg by mouth every 6 (six) hours as needed for moderate pain.   Yes Historical Provider, MD   BP 120/71  Pulse 65  Temp(Src) 97.9 F (36.6 C) (Oral)  Resp 18  SpO2 100%  LMP 09/16/2013 Physical Exam  Constitutional: She is oriented to person, place, and time. She appears well-developed and well-nourished. No distress.  HENT:  Head: Normocephalic and atraumatic.  Mouth/Throat: Oropharynx is clear and moist.  Eyes: Conjunctivae are normal. Pupils are equal, round, and reactive to light.  Neck: Normal range of motion. Neck supple.  Cardiovascular: Normal rate, regular rhythm and intact distal pulses.   Pulmonary/Chest: Effort normal and breath sounds normal. She has no wheezes. She has no rales.  Abdominal: Soft. Bowel sounds are increased. There is no tenderness. There is no rigidity, no rebound, no guarding, no tenderness at McBurney's point and negative Murphy's sign.  Genitourinary: Vaginal discharge found.  Chandelier sign, chaperone present  Musculoskeletal: Normal range of motion.  Neurological: She is alert and oriented to person, place, and time.  Skin: Skin is warm and dry.  Psychiatric: She has a normal mood and affect.    ED Course  Procedures (including critical care time) Labs Review Labs Reviewed  WET PREP, GENITAL - Abnormal; Notable for the following:    WBC, Wet Prep HPF POC FEW (*)  All other components within normal limits  CBC WITH DIFFERENTIAL - Abnormal; Notable for the following:    Hemoglobin 11.7 (*)    HCT 34.6 (*)    RDW 16.7 (*)    All other components within normal limits  COMPREHENSIVE METABOLIC PANEL - Abnormal; Notable for the following:    Glucose, Bld 103 (*)    All other components within normal limits  URINALYSIS, ROUTINE W REFLEX MICROSCOPIC - Abnormal; Notable for the following:    APPearance CLOUDY (*)    Hgb urine dipstick TRACE (*)    All other  components within normal limits  GC/CHLAMYDIA PROBE AMP  LIPASE, BLOOD  URINE MICROSCOPIC-ADD ON  Randolm Idol, ED  POC URINE PREG, ED    Imaging Review Dg Abd Acute W/chest  09/23/2013   CLINICAL DATA:  Sudden onset nausea and upper abdominal pain.  EXAM: ACUTE ABDOMEN SERIES (ABDOMEN 2 VIEW & CHEST 1 VIEW)  COMPARISON:  CT abdomen and pelvis 06/27/2013  FINDINGS: There is no evidence of dilated bowel loops or free intraperitoneal air. No radiopaque calculi or other significant radiographic abnormality is seen. Heart size and mediastinal contours are within normal limits. Both lungs are clear.  IMPRESSION: Negative abdominal radiographs.  No acute cardiopulmonary disease.   Electronically Signed   By: Lucienne Capers M.D.   On: 09/23/2013 04:22     EKG Interpretation None      MDM   Final diagnoses:  None    Gas and hard stool on exam and KUB.  Also has chandelier sign.  Symptoms consistent with PID.  Will treat Follow up with GYN.  No sex of any kind until 7 days after all partners treated   Priscila Bean Alfonso Patten, MD 09/23/13 [phone]

## 2013-09-23 NOTE — ED Notes (Signed)
Pt complains of right lower quad pain acutely 2 hours ago

## 2014-02-14 ENCOUNTER — Encounter (HOSPITAL_COMMUNITY): Payer: Self-pay | Admitting: Emergency Medicine

## 2014-04-08 ENCOUNTER — Emergency Department (HOSPITAL_COMMUNITY): Payer: Self-pay

## 2014-04-08 ENCOUNTER — Encounter (HOSPITAL_COMMUNITY): Payer: Self-pay | Admitting: Emergency Medicine

## 2014-04-08 ENCOUNTER — Emergency Department (HOSPITAL_COMMUNITY)
Admission: EM | Admit: 2014-04-08 | Discharge: 2014-04-08 | Disposition: A | Discharge status: Home / Self Care | Payer: Self-pay | Attending: Emergency Medicine | Admitting: Emergency Medicine

## 2014-04-08 DIAGNOSIS — Z3202 Encounter for pregnancy test, result negative: Secondary | ICD-10-CM | POA: Insufficient documentation

## 2014-04-08 DIAGNOSIS — Z792 Long term (current) use of antibiotics: Secondary | ICD-10-CM | POA: Insufficient documentation

## 2014-04-08 DIAGNOSIS — R102 Pelvic and perineal pain: Secondary | ICD-10-CM | POA: Insufficient documentation

## 2014-04-08 DIAGNOSIS — Z8719 Personal history of other diseases of the digestive system: Secondary | ICD-10-CM | POA: Insufficient documentation

## 2014-04-08 DIAGNOSIS — R1032 Left lower quadrant pain: Secondary | ICD-10-CM | POA: Insufficient documentation

## 2014-04-08 DIAGNOSIS — Z9889 Other specified postprocedural states: Secondary | ICD-10-CM | POA: Insufficient documentation

## 2014-04-08 DIAGNOSIS — N898 Other specified noninflammatory disorders of vagina: Secondary | ICD-10-CM | POA: Insufficient documentation

## 2014-04-08 DIAGNOSIS — Z9851 Tubal ligation status: Secondary | ICD-10-CM | POA: Insufficient documentation

## 2014-04-08 DIAGNOSIS — Z8659 Personal history of other mental and behavioral disorders: Secondary | ICD-10-CM | POA: Insufficient documentation

## 2014-04-08 LAB — URINALYSIS, ROUTINE W REFLEX MICROSCOPIC
Bilirubin Urine: NEGATIVE
Glucose, UA: NEGATIVE mg/dL
Ketones, ur: NEGATIVE mg/dL
Nitrite: NEGATIVE
Protein, ur: NEGATIVE mg/dL
Specific Gravity, Urine: 1.004 — ABNORMAL LOW (ref 1.005–1.030)
Urobilinogen, UA: 0.2 mg/dL (ref 0.0–1.0)
pH: 6 (ref 5.0–8.0)

## 2014-04-08 LAB — I-STAT CHEM 8, ED
BUN: 11 mg/dL (ref 6–23)
CHLORIDE: 108 meq/L (ref 96–112)
Calcium, Ion: 1.27 mmol/L — ABNORMAL HIGH (ref 1.12–1.23)
Creatinine, Ser: 0.7 mg/dL (ref 0.50–1.10)
GLUCOSE: 111 mg/dL — AB (ref 70–99)
HEMATOCRIT: 41 % (ref 36.0–46.0)
HEMOGLOBIN: 13.9 g/dL (ref 12.0–15.0)
POTASSIUM: 3.7 mmol/L (ref 3.5–5.1)
SODIUM: 143 mmol/L (ref 135–145)
TCO2: 19 mmol/L (ref 0–100)

## 2014-04-08 LAB — URINE MICROSCOPIC-ADD ON

## 2014-04-08 LAB — CBC WITH DIFFERENTIAL/PLATELET
BASOS ABS: 0 10*3/uL (ref 0.0–0.1)
BASOS PCT: 0 % (ref 0–1)
EOS ABS: 0.1 10*3/uL (ref 0.0–0.7)
Eosinophils Relative: 1 % (ref 0–5)
HCT: 37.7 % (ref 36.0–46.0)
Hemoglobin: 12.6 g/dL (ref 12.0–15.0)
Lymphocytes Relative: 26 % (ref 12–46)
Lymphs Abs: 2.3 10*3/uL (ref 0.7–4.0)
MCH: 29.4 pg (ref 26.0–34.0)
MCHC: 33.4 g/dL (ref 30.0–36.0)
MCV: 88.1 fL (ref 78.0–100.0)
Monocytes Absolute: 0.6 10*3/uL (ref 0.1–1.0)
Monocytes Relative: 7 % (ref 3–12)
NEUTROS ABS: 5.7 10*3/uL (ref 1.7–7.7)
Neutrophils Relative %: 66 % (ref 43–77)
PLATELETS: 236 10*3/uL (ref 150–400)
RBC: 4.28 MIL/uL (ref 3.87–5.11)
RDW: 16.1 % — AB (ref 11.5–15.5)
WBC: 8.7 10*3/uL (ref 4.0–10.5)

## 2014-04-08 LAB — WET PREP, GENITAL
Clue Cells Wet Prep HPF POC: NONE SEEN
Trich, Wet Prep: NONE SEEN
Yeast Wet Prep HPF POC: NONE SEEN

## 2014-04-08 LAB — POC URINE PREG, ED: PREG TEST UR: NEGATIVE

## 2014-04-08 MED ORDER — PROMETHAZINE HCL 25 MG PO TABS: 25.0000 mg | ORAL_TABLET | Freq: Four times a day (QID) | ORAL | Status: DC | PRN

## 2014-04-08 MED ORDER — SODIUM CHLORIDE 0.9 % IV BOLUS (SEPSIS)
1000.0000 mL | Freq: Once | INTRAVENOUS | Status: AC
  Administered 2014-04-08: 1000 mL via INTRAVENOUS

## 2014-04-08 MED ORDER — ONDANSETRON HCL 4 MG/2ML IJ SOLN
4.0000 mg | Freq: Once | INTRAMUSCULAR | Status: AC
  Administered 2014-04-08: 4 mg via INTRAVENOUS
  Filled 2014-04-08: qty 2

## 2014-04-08 MED ORDER — KETOROLAC TROMETHAMINE 30 MG/ML IJ SOLN
30.0000 mg | Freq: Once | INTRAMUSCULAR | Status: AC
  Administered 2014-04-08: 30 mg via INTRAVENOUS
  Filled 2014-04-08: qty 1

## 2014-04-08 MED ORDER — HYDROCODONE-ACETAMINOPHEN 5-325 MG PO TABS: 1.0000 | ORAL_TABLET | Freq: Four times a day (QID) | ORAL | Status: DC | PRN

## 2014-04-08 MED ORDER — HYDROMORPHONE HCL 1 MG/ML IJ SOLN
1.0000 mg | Freq: Once | INTRAMUSCULAR | Status: AC
  Administered 2014-04-08: 1 mg via INTRAVENOUS
  Filled 2014-04-08: qty 1

## 2014-04-08 MED ORDER — AZITHROMYCIN 250 MG PO TABS
1000.0000 mg | ORAL_TABLET | Freq: Once | ORAL | Status: AC
  Administered 2014-04-08: 1000 mg via ORAL
  Filled 2014-04-08: qty 4

## 2014-04-08 MED ORDER — CEFTRIAXONE SODIUM 250 MG IJ SOLR
250.0000 mg | Freq: Once | INTRAMUSCULAR | Status: AC
  Administered 2014-04-08: 250 mg via INTRAMUSCULAR
  Filled 2014-04-08: qty 250

## 2014-04-08 NOTE — ED Notes (Signed)
Bed: GM01 Expected date:  Expected time:  Means of arrival:  Comments: EMS-dysuria

## 2014-04-08 NOTE — ED Provider Notes (Signed)
CSN: [id number]     Arrival date & time 04/08/14  0827 History   First MD Initiated Contact with Patient 04/08/14 0830     Chief Complaint  Patient presents with  . Flank Pain     (Consider location/radiation/quality/duration/timing/severity/associated sxs/prior Treatment) Patient is a 42 y.o. female presenting with flank pain. The history is provided by the patient and a friend.  Flank Pain This is a new problem. The current episode started 3 to 5 hours ago. The problem occurs constantly. The problem has been gradually worsening. Associated symptoms include abdominal pain. Pertinent negatives include no chest pain and no shortness of breath. Associated symptoms comments: Pt has had bilateral flank pain since tues but became abruptly worse this morning.  Chills, nausea without vomiting.  No diarrhea.  Dysuria.. Nothing relieves the symptoms. She has tried nothing for the symptoms. The treatment provided no relief.    Past Medical History  Diagnosis Date  . Abdominal hernia   . Hernia of abdominal cavity 2009  . Depression     [facility]   Past Surgical History  Procedure Laterality Date  . Dilation and curettage of uterus    . Tubal ligation  07/25/2011    Procedure: POST PARTUM TUBAL LIGATION;  Surgeon: Bonnie Bellman, MD;  Location: [facility];  Service: Gynecology;  Laterality: Bilateral;   Family History  Problem Relation Age of Onset  . Hypertension Mother   . Anesthesia problems Neg Hx   . Hypotension Neg Hx   . Malignant hyperthermia Neg Hx   . Pseudochol deficiency Neg Hx    History  Substance Use Topics  . Smoking status: Never Smoker   . Smokeless tobacco: Never Used  . Alcohol Use: No   OB History    Gravida Para Term Preterm AB TAB SAB Ectopic Multiple Living   9 6 6  0 3 0 3 0 0 6     Review of Systems  Respiratory: Negative for shortness of breath.   Cardiovascular: Negative for chest pain.  Gastrointestinal: Positive for abdominal  pain.  Genitourinary: Positive for flank pain.  All other systems reviewed and are negative.     Allergies  Review of patient's allergies indicates no known allergies.  Home Medications   Prior to Admission medications   Medication Sig Start Date End Date Taking? Authorizing Provider  doxycycline (VIBRAMYCIN) 100 MG capsule Take 1 capsule (100 mg total) by mouth 2 (two) times daily. 09/23/13   Bonnie K Palumbo-Rasch, MD  HYDROcodone-acetaminophen (NORCO) 5-325 MG per tablet Take 1 tablet by mouth every 4 (four) hours as needed. 09/23/13   Bonnie K Palumbo-Rasch, MD  ibuprofen (ADVIL,MOTRIN) 200 MG tablet Take 600 mg by mouth every 6 (six) hours as needed for moderate pain.    Historical Provider, MD   SpO2 100% Physical Exam  Constitutional: She is oriented to person, place, and time. She appears well-developed and well-nourished. She appears distressed.  Appears very uncomfortable  HENT:  Head: Normocephalic and atraumatic.  Mouth/Throat: Oropharynx is clear and moist.  Eyes: Conjunctivae and EOM are normal. Pupils are equal, round, and reactive to light.  Neck: Normal range of motion. Neck supple.  Cardiovascular: Normal rate, regular rhythm and intact distal pulses.   No murmur heard. Pulmonary/Chest: Effort normal and breath sounds normal. No respiratory distress. She has no wheezes. She has no rales.  Abdominal: Soft. Bowel sounds are normal. She exhibits no distension. There is tenderness in the suprapubic area and left lower  quadrant. There is guarding and CVA tenderness. There is no rebound.  Genitourinary: Uterus normal. Cervix exhibits motion tenderness and discharge. Right adnexum displays no mass, no tenderness and no fullness. Left adnexum displays tenderness. Left adnexum displays no mass. No bleeding in the vagina. Vaginal discharge found.  Musculoskeletal: Normal range of motion. She exhibits no edema or tenderness.  Neurological: She is alert and oriented to person,  place, and time.  Skin: Skin is warm and dry. No rash noted. No erythema.  Psychiatric: She has a normal mood and affect. Her behavior is normal.  Nursing note and vitals reviewed.   ED Course  Procedures (including critical care time) Labs Review Labs Reviewed  WET PREP, GENITAL - Abnormal; Notable for the following:    WBC, Wet Prep HPF POC MANY (*)    All other components within normal limits  URINALYSIS, ROUTINE W REFLEX MICROSCOPIC - Abnormal; Notable for the following:    APPearance CLOUDY (*)    Specific Gravity, Urine 1.004 (*)    Hgb urine dipstick TRACE (*)    Leukocytes, UA SMALL (*)    All other components within normal limits  CBC WITH DIFFERENTIAL - Abnormal; Notable for the following:    RDW 16.1 (*)    All other components within normal limits  I-STAT CHEM 8, ED - Abnormal; Notable for the following:    Glucose, Bld 111 (*)    Calcium, Ion 1.27 (*)    All other components within normal limits  GC/CHLAMYDIA PROBE AMP  URINE MICROSCOPIC-ADD ON  POC URINE PREG, ED    Imaging Review US Transvaginal Non-ob  04/08/2014   CLINICAL DATA:  Bilateral flank pain and pelvic pain.  EXAM: TRANSABDOMINAL AND TRANSVAGINAL ULTRASOUND OF PELVIS  DOPPLER ULTRASOUND OF OVARIES  TECHNIQUE: Both transabdominal and transvaginal ultrasound examinations of the pelvis were performed. Transabdominal technique was performed for global imaging of the pelvis including uterus, ovaries, adnexal regions, and pelvic cul-de-sac.  It was necessary to proceed with endovaginal exam following the transabdominal exam to visualize the ovaries. Color and duplex Doppler ultrasound was utilized to evaluate blood flow to the ovaries.  COMPARISON:  09/23/2013  FINDINGS: Uterus  Measurements: 9.4 x 3.4 x 6.0 cm. No evidence for fibroids. The uterus is mildly heterogeneous and similar to the previous examination. Again noted are nabothian cysts.  Endometrium  Thickness: 1.2 cm.  No focal abnormality visualized.   Right ovary  Measurements: 2.5 x 2.3 x 2.4 cm. Normal appearance of the right ovary with a small amount of fluid around the right ovary.  Left ovary  Measurements: 3.1 x 1.9 x 4.1 cm. Normal appearance of the left ovary with a small amount of fluid around the left ovary.  Pulsed Doppler evaluation of both ovaries demonstrates normal low-resistance arterial and venous waveforms.  Other findings  Small amount of fluid around both ovaries.  IMPRESSION: Normal appearance of the ovaries without torsion. Small amount of fluid around both ovaries.  No acute abnormality to the uterus.   Electronically Signed   By: Markus Daft M.D.   On: 04/08/2014 12:28   US Pelvis Complete  04/08/2014   CLINICAL DATA:  Bilateral flank pain and pelvic pain.  EXAM: TRANSABDOMINAL AND TRANSVAGINAL ULTRASOUND OF PELVIS  DOPPLER ULTRASOUND OF OVARIES  TECHNIQUE: Both transabdominal and transvaginal ultrasound examinations of the pelvis were performed. Transabdominal technique was performed for global imaging of the pelvis including uterus, ovaries, adnexal regions, and pelvic cul-de-sac.  It was necessary to proceed with endovaginal  exam following the transabdominal exam to visualize the ovaries. Color and duplex Doppler ultrasound was utilized to evaluate blood flow to the ovaries.  COMPARISON:  09/23/2013  FINDINGS: Uterus  Measurements: 9.4 x 3.4 x 6.0 cm. No evidence for fibroids. The uterus is mildly heterogeneous and similar to the previous examination. Again noted are nabothian cysts.  Endometrium  Thickness: 1.2 cm.  No focal abnormality visualized.  Right ovary  Measurements: 2.5 x 2.3 x 2.4 cm. Normal appearance of the right ovary with a small amount of fluid around the right ovary.  Left ovary  Measurements: 3.1 x 1.9 x 4.1 cm. Normal appearance of the left ovary with a small amount of fluid around the left ovary.  Pulsed Doppler evaluation of both ovaries demonstrates normal low-resistance arterial and venous waveforms.  Other  findings  Small amount of fluid around both ovaries.  IMPRESSION: Normal appearance of the ovaries without torsion. Small amount of fluid around both ovaries.  No acute abnormality to the uterus.   Electronically Signed   By: Markus Daft M.D.   On: 04/08/2014 12:28   [place] Art/ven Flow Abd Pelv Doppler  04/08/2014   CLINICAL DATA:  Bilateral flank pain and pelvic pain.  EXAM: TRANSABDOMINAL AND TRANSVAGINAL ULTRASOUND OF PELVIS  DOPPLER ULTRASOUND OF OVARIES  TECHNIQUE: Both transabdominal and transvaginal ultrasound examinations of the pelvis were performed. Transabdominal technique was performed for global imaging of the pelvis including uterus, ovaries, adnexal regions, and pelvic cul-de-sac.  It was necessary to proceed with endovaginal exam following the transabdominal exam to visualize the ovaries. Color and duplex Doppler ultrasound was utilized to evaluate blood flow to the ovaries.  COMPARISON:  09/23/2013  FINDINGS: Uterus  Measurements: 9.4 x 3.4 x 6.0 cm. No evidence for fibroids. The uterus is mildly heterogeneous and similar to the previous examination. Again noted are nabothian cysts.  Endometrium  Thickness: 1.2 cm.  No focal abnormality visualized.  Right ovary  Measurements: 2.5 x 2.3 x 2.4 cm. Normal appearance of the right ovary with a small amount of fluid around the right ovary.  Left ovary  Measurements: 3.1 x 1.9 x 4.1 cm. Normal appearance of the left ovary with a small amount of fluid around the left ovary.  Pulsed Doppler evaluation of both ovaries demonstrates normal low-resistance arterial and venous waveforms.  Other findings  Small amount of fluid around both ovaries.  IMPRESSION: Normal appearance of the ovaries without torsion. Small amount of fluid around both ovaries.  No acute abnormality to the uterus.   Electronically Signed   By: Markus Daft M.D.   On: 04/08/2014 12:28     EKG Interpretation None      MDM   Final diagnoses:  Pelvic pain in female    In speaking to  the patient with the interpreter she has had bilateral flank pain since Tuesday with some mild dysuria and pelvic pain that acutely worsened today after having sexual intercourse. She states it feels like something popped inside and had severe pain. Patient's been seen in emergency room in August and diagnosed with PID. She stated this pain was similar at that time but not as severe as it is today. At that time patient was given doxycycline and pain control which she took but states did not seem to improve her pain it just eventually went away. She has felt chilled with nausea but denies any vomiting. On exam she appears extremely uncomfortable but has normal vital signs. Her CBC, UPT, UA and i-STAT  are all within normal limits.  We'll get a transvaginal ultrasound to rule out torsion given the sudden nature of the worsening pain. Patient given IV pain control.  12:39 PM [place] neg for torsion.  Small amt of fluid present which may be ruptured cyst.  WBC on wet prep but no other findings.  Will treat pain and give rocephin and azithro for possible infection but low suspicion at this time for PID.  Will have f/u with [facility].  Blanchie Dessert, MD 04/08/14 [phone]

## 2014-04-08 NOTE — ED Notes (Signed)
Pt c/o lower abd pain that started in middle and radiates more to the right, denies n/v/d.

## 2014-04-08 NOTE — ED Notes (Signed)
Pt onset of bilateral flank pain worsening since Tuesday. Pt reports dysuria.

## 2014-04-11 LAB — GC/CHLAMYDIA PROBE AMP
CT Probe RNA: NEGATIVE
GC PROBE AMP APTIMA: NEGATIVE

## 2014-04-27 ENCOUNTER — Ambulatory Visit: Payer: Self-pay

## 2014-04-28 ENCOUNTER — Ambulatory Visit: Payer: Self-pay

## 2014-04-30 ENCOUNTER — Emergency Department (HOSPITAL_COMMUNITY)
Admission: EM | Admit: 2014-04-30 | Discharge: 2014-05-01 | Disposition: A | Discharge status: Home / Self Care | Payer: Self-pay | Attending: Emergency Medicine | Admitting: Emergency Medicine

## 2014-04-30 ENCOUNTER — Encounter (HOSPITAL_COMMUNITY): Payer: Self-pay | Admitting: Emergency Medicine

## 2014-04-30 DIAGNOSIS — Z8742 Personal history of other diseases of the female genital tract: Secondary | ICD-10-CM | POA: Insufficient documentation

## 2014-04-30 DIAGNOSIS — Z3202 Encounter for pregnancy test, result negative: Secondary | ICD-10-CM | POA: Insufficient documentation

## 2014-04-30 DIAGNOSIS — Z9851 Tubal ligation status: Secondary | ICD-10-CM | POA: Insufficient documentation

## 2014-04-30 DIAGNOSIS — Z8719 Personal history of other diseases of the digestive system: Secondary | ICD-10-CM | POA: Insufficient documentation

## 2014-04-30 DIAGNOSIS — G8929 Other chronic pain: Secondary | ICD-10-CM | POA: Insufficient documentation

## 2014-04-30 DIAGNOSIS — R109 Unspecified abdominal pain: Secondary | ICD-10-CM | POA: Insufficient documentation

## 2014-04-30 HISTORY — DX: Unspecified ovarian cyst, unspecified side: N83.209

## 2014-04-30 LAB — POC URINE PREG, ED: Preg Test, Ur: NEGATIVE

## 2014-04-30 MED ORDER — MORPHINE SULFATE 4 MG/ML IJ SOLN
4.0000 mg | Freq: Once | INTRAMUSCULAR | Status: AC
  Administered 2014-04-30: 4 mg via INTRAVENOUS
  Filled 2014-04-30: qty 1

## 2014-04-30 MED ORDER — ONDANSETRON HCL 4 MG/2ML IJ SOLN
4.0000 mg | Freq: Once | INTRAMUSCULAR | Status: AC
  Administered 2014-05-01: 4 mg via INTRAVENOUS
  Filled 2014-04-30: qty 2

## 2014-04-30 MED ORDER — SODIUM CHLORIDE 0.9 % IV BOLUS (SEPSIS)
1000.0000 mL | Freq: Once | INTRAVENOUS | Status: AC
  Administered 2014-04-30: 1000 mL via INTRAVENOUS

## 2014-04-30 NOTE — ED Provider Notes (Signed)
CSN: [id number]     Arrival date & time 04/30/14  2308 History   First MD Initiated Contact with Patient 04/30/14 2315     Chief Complaint  Patient presents with  . Abdominal Pain     (Consider location/radiation/quality/duration/timing/severity/associated sxs/prior Treatment) HPI   Bonnie Conrad is a 44 y.o. female with past medical history of abdominal hernia, ovarian cysts, chronic abdominal pain coming in with abdominal pain. History was obtained by translator phone. Patient states she has had abdominal pain for the past couple of days, with bilateral lower quadrants and severe in intensity. This is been going on since December, no chill that she has been seen even prior to that for abdominal pain. 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 She admits to dysuria, no hematuria. Her menstrual cycle has been normal, her last one was a week and a half ago. She denies any vaginal discharge or abnormal vaginal bleeding. She's had no fevers. Patient also complains of chronic midepigastric abdominal pain at hernia site. Patient has no further complaints, she is accompanied by her boyfriend and daughter.  10 Systems reviewed and are negative for acute change except as noted in the HPI.    Past Medical History  Diagnosis Date  . Abdominal hernia   . Hernia of abdominal cavity 2009  . Depression     hosp during pregnancy at [facility] health  . Ovarian cyst    Past Surgical History  Procedure Laterality Date  . Dilation and curettage of uterus    . Tubal ligation  07/25/2011    Procedure: POST PARTUM TUBAL LIGATION;  Surgeon: Mora Bellman, MD;  Location: [facility];  Service: Gynecology;  Laterality: Bilateral;   Family History  Problem Relation Age of Onset  . Hypertension Mother   . Anesthesia problems Neg Hx   . Hypotension Neg Hx   . Malignant hyperthermia Neg Hx   . Pseudochol deficiency Neg Hx    History  Substance  Use Topics  . Smoking status: Never Smoker   . Smokeless tobacco: Never Used  . Alcohol Use: No   OB History    Gravida Para Term Preterm AB TAB SAB Ectopic Multiple Living   9 6 6  0 3 0 3 0 0 6     Review of Systems    Allergies  Vicodin  Home Medications   Prior to Admission medications   Medication Sig Start Date End Date Taking? Authorizing Provider  doxycycline (VIBRAMYCIN) 100 MG capsule Take 1 capsule (100 mg total) by mouth 2 (two) times daily. Patient not taking: Reported on 04/08/2014 09/23/13   April K Palumbo-Rasch, MD  HYDROcodone-acetaminophen (NORCO/VICODIN) 5-325 MG per tablet Take 1-2 tablets by mouth every 6 (six) hours as needed for moderate pain or severe pain. Patient not taking: Reported on 04/30/2014 04/08/14   Blanchie Dessert, MD  promethazine (PHENERGAN) 25 MG tablet Take 1 tablet (25 mg total) by mouth every 6 (six) hours as needed for nausea or vomiting. Patient not taking: Reported on 04/30/2014 04/08/14   Blanchie Dessert, MD   BP 113/53 mmHg  Pulse 79  Temp(Src) 98.7 F (37.1 C) (Oral)  Resp 18  SpO2 100% Physical Exam  Constitutional: She is oriented to person, place, and time. She appears well-developed and well-nourished. She appears distressed.  Distress due to pain.  HENT:  Head: Normocephalic and atraumatic.  Nose: Nose normal.  Mouth/Throat: Oropharynx is clear and moist. No oropharyngeal exudate.  Eyes: Conjunctivae and EOM are normal. Pupils are equal, round, and reactive to light. No scleral icterus.  Neck: Normal range of motion. Neck supple. No JVD present. No tracheal deviation present. No thyromegaly present.  Cardiovascular: Normal rate, regular rhythm and normal heart sounds.  Exam reveals no gallop and no friction rub.   No murmur heard. Pulmonary/Chest: Effort normal and breath sounds normal. No respiratory distress. She has no wheezes. She exhibits no tenderness.  Abdominal: Soft. Bowel sounds are normal. She exhibits no  distension and no mass. There is no tenderness. There is no rebound and no guarding.  Musculoskeletal: Normal range of motion. She exhibits no edema or tenderness.  Lymphadenopathy:    She has no cervical adenopathy.  Neurological: She is alert and oriented to person, place, and time. No cranial nerve deficit. She exhibits normal muscle tone.  Skin: Skin is warm and dry. No rash noted. She is not diaphoretic. No erythema. No pallor.  Nursing note and vitals reviewed.   ED Course  Procedures (including critical care time) Labs Review Labs Reviewed  WET PREP, GENITAL - Abnormal; Notable for the following:    WBC, Wet Prep HPF POC FEW (*)    All other components within normal limits  CBC WITH DIFFERENTIAL - Abnormal; Notable for the following:    WBC 10.7 (*)    RDW 15.8 (*)    All other components within normal limits  COMPREHENSIVE METABOLIC PANEL - Abnormal; Notable for the following:    Potassium 3.4 (*)    Glucose, Bld 103 (*)    All other components within normal limits  URINALYSIS, ROUTINE W REFLEX MICROSCOPIC - Abnormal; Notable for the following:    Hgb urine dipstick TRACE (*)    Leukocytes, UA TRACE (*)    All other components within normal limits  URINE MICROSCOPIC-ADD ON - Abnormal; Notable for the following:    Squamous Epithelial / LPF FEW (*)    All other components within normal limits  I-STAT CHEM 8, ED - Abnormal; Notable for the following:    Potassium 3.4 (*)    Glucose, Bld 107 (*)    All other components within normal limits  URINE CULTURE  LIPASE, BLOOD  POC URINE PREG, ED  POC URINE PREG, ED  GC/CHLAMYDIA PROBE AMP ([place])    Imaging Review No results found.   EKG Interpretation None      MDM   Final diagnoses:  None    Patient presents to the emergency department for abdominal pain. She's been seen multiple times for this in the past and been diagnosed with PID. She was given IV fluids, morphine, Zofran for relief. Will perform  pelvic exam. Patient is moving around in bed, distress from pain however her abdominal exam is unremarkable.  Patient was redosed with morphine. Workup is unremarkable. Pelvic exam did not show any cause for her pain. I believe the patient is having chronic abdominal pain. Imaging is not warranting as the patient has had multiple ultrasounds that have been negative. Abdomen is not tender, and previous CT scan has shown a fat-containing hernia. I feel no hernia on exam tonight.  At this time the patient's vital signs remain within her normal limits and she is safe for discharge.    Everlene Balls, MD 05/01/14 [phone]

## 2014-04-30 NOTE — ED Notes (Signed)
Pt c/o low abd pain w/ nausea since this morning.  Hx of ovarian cyst.

## 2014-04-30 NOTE — ED Notes (Signed)
Awake. Verbally responsive. A/O x4. Resp even and unlabored. No audible adventitious breath sounds noted. ABC's intact. Pt reported pain/burning with urination. Lower abd pain with pain on palpation. Abd soft/nondistended.

## 2014-05-01 LAB — I-STAT CHEM 8, ED
BUN: 7 mg/dL (ref 6–23)
Calcium, Ion: 1.23 mmol/L (ref 1.12–1.23)
Chloride: 103 mEq/L (ref 96–112)
Creatinine, Ser: 0.7 mg/dL (ref 0.50–1.10)
Glucose, Bld: 107 mg/dL — ABNORMAL HIGH (ref 70–99)
HCT: 41 % (ref 36.0–46.0)
HEMOGLOBIN: 13.9 g/dL (ref 12.0–15.0)
POTASSIUM: 3.4 mmol/L — AB (ref 3.5–5.1)
SODIUM: 142 mmol/L (ref 135–145)
TCO2: 23 mmol/L (ref 0–100)

## 2014-05-01 LAB — URINE MICROSCOPIC-ADD ON

## 2014-05-01 LAB — COMPREHENSIVE METABOLIC PANEL
ALK PHOS: 70 U/L (ref 39–117)
ALT: 14 U/L (ref 0–35)
AST: 20 U/L (ref 0–37)
Albumin: 4.9 g/dL (ref 3.5–5.2)
Anion gap: 9 (ref 5–15)
BUN: 8 mg/dL (ref 6–23)
CO2: 26 mmol/L (ref 19–32)
CREATININE: 0.67 mg/dL (ref 0.50–1.10)
Calcium: 9.4 mg/dL (ref 8.4–10.5)
Chloride: 103 mEq/L (ref 96–112)
GFR calc Af Amer: >90 mL/min (ref >90)
GFR calc non Af Amer: >90 mL/min (ref >90)
Glucose, Bld: 103 mg/dL — ABNORMAL HIGH (ref 70–99)
POTASSIUM: 3.4 mmol/L — AB (ref 3.5–5.1)
Sodium: 138 mmol/L (ref 135–145)
TOTAL PROTEIN: 8.1 g/dL (ref 6.0–8.3)
Total Bilirubin: 0.7 mg/dL (ref 0.3–1.2)

## 2014-05-01 LAB — WET PREP, GENITAL
CLUE CELLS WET PREP: NONE SEEN
Trich, Wet Prep: NONE SEEN
YEAST WET PREP: NONE SEEN

## 2014-05-01 LAB — URINALYSIS, ROUTINE W REFLEX MICROSCOPIC
Bilirubin Urine: NEGATIVE
GLUCOSE, UA: NEGATIVE mg/dL
KETONES UR: NEGATIVE mg/dL
NITRITE: NEGATIVE
PH: 6.5 (ref 5.0–8.0)
PROTEIN: NEGATIVE mg/dL
SPECIFIC GRAVITY, URINE: 1.007 (ref 1.005–1.030)
Urobilinogen, UA: 0.2 mg/dL (ref 0.0–1.0)

## 2014-05-01 LAB — CBC WITH DIFFERENTIAL/PLATELET
BASOS ABS: 0 10*3/uL (ref 0.0–0.1)
BASOS PCT: 0 % (ref 0–1)
EOS PCT: 1 % (ref 0–5)
Eosinophils Absolute: 0.1 10*3/uL (ref 0.0–0.7)
HCT: 37.2 % (ref 36.0–46.0)
Hemoglobin: 12.4 g/dL (ref 12.0–15.0)
LYMPHS ABS: 2.9 10*3/uL (ref 0.7–4.0)
LYMPHS PCT: 27 % (ref 12–46)
MCH: 30 pg (ref 26.0–34.0)
MCHC: 33.3 g/dL (ref 30.0–36.0)
MCV: 89.9 fL (ref 78.0–100.0)
Monocytes Absolute: 0.7 10*3/uL (ref 0.1–1.0)
Monocytes Relative: 7 % (ref 3–12)
Neutro Abs: 7 10*3/uL (ref 1.7–7.7)
Neutrophils Relative %: 65 % (ref 43–77)
Platelets: 274 10*3/uL (ref 150–400)
RBC: 4.14 MIL/uL (ref 3.87–5.11)
RDW: 15.8 % — ABNORMAL HIGH (ref 11.5–15.5)
WBC: 10.7 10*3/uL — AB (ref 4.0–10.5)

## 2014-05-01 LAB — LIPASE, BLOOD: Lipase: 26 U/L (ref 11–59)

## 2014-05-01 MED ORDER — MORPHINE SULFATE 4 MG/ML IJ SOLN
4.0000 mg | Freq: Once | INTRAMUSCULAR | Status: AC
  Administered 2014-05-01: 4 mg via INTRAVENOUS
  Filled 2014-05-01: qty 1

## 2014-05-01 NOTE — ED Notes (Addendum)
Awake. Verbally responsive. A/O x4. Resp even and unlabored. No audible adventitious breath sounds noted. ABC's intact. IV infusing NS without difficulty.

## 2014-05-01 NOTE — ED Notes (Signed)
Awake. Verbally responsive. A/O x4. Resp even and unlabored. No audible adventitious breath sounds noted. ABC's intact. NAD noted. 

## 2014-05-01 NOTE — Discharge Instructions (Signed)
Dolor abdominal (Abdominal Pain) Ms. Shyrl Numbers, your workup for abdominal pain here was normal. Follow-up with a primary care physician within 3 days to manage her chronic abdominal pain. If your symptoms worsen come back to emergency department. Take Tylenol or Motrin for the pain. Thank you.  Sra Shyrl Numbers, el estudio diagnstico de dolor abdominal aqu era normal. El seguimiento con un mdico de atencin primaria dentro de los 3 das para Air cabin crew su dolor abdominal crnico. Si sus sntomas empeoran volver a urgencias. Gracias. El dolor de [place] (abdominal) puede tener muchas causas. [place] veces, el dolor de [place] no es peligroso. Muchos de [employer] de dolor de estmago pueden controlarse y tratarse en casa. CUIDADOS EN EL HOGAR   No tome medicamentos que lo ayuden a defecar (laxantes), salvo que su mdico se lo indique.  Solo tome los medicamentos que le haya indicado su mdico.  Coma o beba lo que le indique su mdico. Su mdico le dir si debe seguir una dieta especial. SOLICITE AYUDA SI:  No sabe cul es la causa del dolor de [place].  Tiene dolor de estmago cuando siente ganas de vomitar (nuseas) o tiene colitis (diarrea).  Tiene dolor durante la miccin o la evacuacin.  El dolor de estmago lo despierta de noche.  Tiene dolor de [employer] empeora o [place] cuando come.  Tiene dolor de [employer] empeora cuando come [employer].  Tiene fiebre. SOLICITE AYUDA DE INMEDIATO SI:   El dolor no desaparece en un plazo mximo de 2horas.  No deja de (vomitar).  El dolor cambia y se Administrator, sports solo en la parte derecha o izquierda del [place].  La materia fecal es sanguinolenta o de aspecto alquitranado. ASEGRESE DE QUE:   Comprende estas instrucciones.  Controlar su afeccin.  Recibir ayuda de inmediato si no mejora o si empeora. Document Released: 06/28/2008 Document Revised: 04/06/2013 [facility] Patient Information 2015  [place]. This information is not intended to replace advice given to you by your health care provider. Make sure you discuss any questions you have with your health care provider. Dolor crnico (Chronic Pain) El dolor crnico puede definirse como aquel que aparece y desaparece, y dura entre 3 y 6 meses o ms. Puede tener muchas causas, lo que hace difcil realizar un diagnstico. Hay muchas opciones de tratamiento disponibles. Sin embargo, Designer, jewellery un tratamiento que resulte bien para usted puede requerir diferentes abordajes hasta que se encuentre el [place]. Muchas personas se benefician con una combinacin de uno o ms tipos de tratamiento para Financial controller. SNTOMAS  El dolor crnico puede producirse en cualquier parte del organismo y puede variar desde leve a muy intenso. Algunos tipos de dolor crnico son:  Dolor de [place].  Dolor lumbar.  Dolor por cncer.  Dolor por artritis.  Dolor neurognico. Este ltimo dolor es el resultado de lesiones nerviosas. Las personas que sufren dolor crnico tambin pueden tener otros sntomas como:  Depresin.  Enojo.  Insomnio.  Ansiedad. DIAGNSTICO  El medico har el diagnstico de la afeccin luego de un [place]. En muchos casos, el foco inicial estar en excluir trastornos que sean causa de dolor. El mdico indicar algunos anlisis para diagnosticar el problema, segn los sntomas que [place]. Entre estas pruebas se incluyen:   Anlisis de [place].   Tomografa computada.   Resonancia magntica.   Radiografas.   Ecografas.   Estudios de [employer].  Es posible que deba consultar a Teaching laboratory technician.  TRATAMIENTO  Hallar un  tratamiento que de resultado puede llevar [employer]. Posiblemente lo derivarn a un especialista. El especialista podr indicarle medicamentos o terapias, como:   Meditacin de atencin plena o yoga.  Medicamentos o inyecciones de anestsicos o analgsicos en la columna vertebral  o en la zona que la rodea.  Estimulacin elctrica local.  Acupuntura.   Terapia de masajes.   Terapia con aromas, colores, luz o sonidos.   Biorretroalimentacin.   Trabajo con un fisioterapeuta para Human resources officer rigidez.   Ejercicios regulares y suaves.   Terapia cognitivo conductual.   Grupos de apoyo.  En algunos casos podr indicarse la [place].  [place] todos los medicamentos como le indic el mdico.   Disminuya el estrs en su vida relajndose y haciendo ciertas cosas, como escuchar msica tranquila.   Haga ejercicios o actividades segn las indicaciones del mdico.   Consuma una dieta saludable e incluya vegetales, frutas, pescado y carnes [place].   [place] con todas las visitas de control, segn le indique su mdico.   Asista a un grupo de apoyo con otras personas que sufren de dolor crnico. SOLICITE ATENCIN MDICA SI:   El dolor empeora.   Siente un nuevo dolor, que no tena antes.   No tolera los medicamentos que le indic el mdico.   Presenta nuevos sntomas desde la ltima consulta al mdico.  SOLICITE ATENCIN MDICA DE INMEDIATO SI:   Se siente dbil.   Tiene menos sensibilidad o siente adormecimientos.   Pierde el control de la vejiga o del intestino.   El dolor empeora repentinamente.   Comienza a tener temblores.  Comienza a sentir escalofros.  Se siente confundido.  Siente dolor en el pecho.  Le falta el aire.  ASEGRESE DE QUE:  Comprende estas instrucciones.  Controlar su afeccin.  Recibir ayuda de inmediato si no mejora o si empeora. Document Released: 04/01/2005 Document Revised: 12/02/2012 [facility] Patient Information 2015 [place]. This information is not intended to replace advice given to you by your health care provider. Make sure you discuss any questions you have with your health care provider.

## 2014-05-01 NOTE — ED Notes (Signed)
Awake. Verbally responsive. A/O x4. Resp even and unlabored. No audible adventitious breath sounds noted. ABC's intact.  

## 2014-05-02 LAB — URINE CULTURE
Colony Count: NO GROWTH
Culture: NO GROWTH

## 2014-05-02 LAB — GC/CHLAMYDIA PROBE AMP (~~LOC~~) NOT AT ARMC
CHLAMYDIA, DNA PROBE: NEGATIVE
Neisseria Gonorrhea: NEGATIVE

## 2017-03-30 ENCOUNTER — Encounter (HOSPITAL_COMMUNITY): Payer: Self-pay

## 2017-03-30 ENCOUNTER — Emergency Department (HOSPITAL_COMMUNITY)
Admission: EM | Admit: 2017-03-30 | Discharge: 2017-03-30 | Disposition: A | Discharge status: Home / Self Care | Payer: Self-pay | Attending: Emergency Medicine | Admitting: Emergency Medicine

## 2017-03-30 DIAGNOSIS — F10120 Alcohol abuse with intoxication, uncomplicated: Secondary | ICD-10-CM | POA: Insufficient documentation

## 2017-03-30 DIAGNOSIS — F10920 Alcohol use, unspecified with intoxication, uncomplicated: Secondary | ICD-10-CM

## 2017-03-30 MED ORDER — ONDANSETRON 4 MG PO TBDP
4.0000 mg | ORAL_TABLET | Freq: Once | ORAL | Status: DC
  Filled 2017-03-30: qty 1

## 2017-03-30 NOTE — ED Notes (Signed)
Bed: WA20 Expected date:  Expected time:  Means of arrival:  Comments: EMS 46 yo female anxiety attack-intoxicated-haldol HR 120 124/76

## 2017-03-30 NOTE — ED Triage Notes (Signed)
BIB EMS from Home, EMS states pt was exhibiting bizarre behavior upon their arrival and was given 5mg  of Haldol. Per EMS pts family notified them that pt had been drinking and may have had a panic attack. Pt spanish speaking primarily. Pt is alert and ambulatory w/ assistance to bathroom.

## 2017-03-30 NOTE — ED Provider Notes (Signed)
Bonnie Conrad   CSN: [id number] Arrival date & time: 03/30/17  0422     History   Chief Complaint Chief Complaint  Patient presents with  . Alcohol Intoxication    HPI   Blood pressure (!) 99/59, pulse (!) 106, temperature 97.6 F (36.4 C), temperature source Oral, resp. rate 15, SpO2 97 %.  Bonnie Conrad is a 46 y.o. female brought in by EMS for agitation and bizarre behavior she was pulling out her hair.  Patient states that she was at a family gathering, she was drinking tequila and beer she started vomiting and developed a headache she states that she was pulling her hair because she had a headache.  She denies any hallucinations, suicidal ideation, homicidal ideation.   Past Medical History:  Diagnosis Date  . Abdominal hernia   . Depression    hosp during pregnancy at [facility] health  . Hernia of abdominal cavity 2009  . Ovarian cyst     Patient Active Problem List   Diagnosis Date Noted  . Major depressive disorder, recurrent episode, severe, with psychotic behavior ([place]) 04/03/2011  . Severe major depression with psychotic features ([place]) 03/06/2011  . Hernia of abdominal cavity 03/06/2011    Past Surgical History:  Procedure Laterality Date  . DILATION AND CURETTAGE OF UTERUS    . TUBAL LIGATION  07/25/2011   Procedure: POST PARTUM TUBAL LIGATION;  Surgeon: Mora Bellman, MD;  Location: [facility];  Service: Gynecology;  Laterality: Bilateral;    OB History    Gravida Para Term Preterm AB Living   9 6 6  0 3 6   SAB TAB Ectopic Multiple Live Births   3 0 0 0 5       Home Medications    Prior to Admission medications   Medication Sig Start Date End Date Taking? Authorizing Provider  HYDROcodone-acetaminophen (NORCO/VICODIN) 5-325 MG per tablet Take 1-2 tablets by mouth every 6 (six) hours as needed for moderate pain or severe pain. Patient not taking: Reported on 04/30/2014 04/08/14   Bonnie Dessert, MD    Family History Family History  Problem Relation Age of Onset  . Hypertension Mother   . Anesthesia problems Neg Hx   . Hypotension Neg Hx   . Malignant hyperthermia Neg Hx   . Pseudochol deficiency Neg Hx     Social History Social History   Tobacco Use  . Smoking status: Never Smoker  . Smokeless tobacco: Never Used  Substance Use Topics  . Alcohol use: No  . Drug use: No     Allergies   Vicodin [hydrocodone-acetaminophen]   Review of Systems Review of Systems  A complete review of systems was obtained and all systems are negative except as noted in the HPI and PMH.    Physical Exam Updated Vital Signs BP (!) 99/59 (BP Location: Left Arm)   Pulse (!) 106   Temp 97.6 F (36.4 C) (Oral)   Resp 15   SpO2 97%   Physical Exam  Constitutional: She is oriented to person, place, and time. She appears well-developed and well-nourished. No distress.  HENT:  Head: Normocephalic and atraumatic.  Mouth/Throat: Oropharynx is clear and moist.  Eyes: Conjunctivae and EOM are normal. Pupils are equal, round, and reactive to light.  Neck: Normal range of motion.  Cardiovascular: Normal rate, regular rhythm and intact distal pulses.  Pulmonary/Chest: Effort normal and breath sounds normal. No stridor. No respiratory distress. She has no wheezes. She has  no rales. She exhibits no tenderness.  Abdominal: Soft. She exhibits no distension and no mass. There is no tenderness. There is no rebound and no guarding. No hernia.  Musculoskeletal: Normal range of motion.  Neurological: She is alert and oriented to person, place, and time. No cranial nerve deficit.  Skin: Capillary refill takes less than 2 seconds. She is not diaphoretic.  Psychiatric: She has a normal mood and affect.  Nursing Conrad and vitals reviewed.    ED Treatments / Results  Labs (all labs ordered are listed, but only abnormal results are displayed) Labs Reviewed - No data to display  EKG  EKG  Interpretation None       Radiology No results found.  Procedures Procedures (including critical care time)  Medications Ordered in ED Medications  ondansetron (ZOFRAN-ODT) disintegrating tablet 4 mg (4 mg Oral Not Given 03/30/17 0606)     Initial Impression / Assessment and Plan / ED Course  I have reviewed the triage vital signs and the nursing notes.  Pertinent labs & imaging results that were available during my care of the patient were reviewed by me and considered in my medical decision making (see chart for details).     Vitals:   03/30/17 0422  BP: (!) 99/59  Pulse: (!) 106  Resp: 15  Temp: 97.6 F (36.4 C)  TempSrc: Oral  SpO2: 97%    Medications  ondansetron (ZOFRAN-ODT) disintegrating tablet 4 mg (4 mg Oral Not Given 03/30/17 0606)    Bonnie Conrad is 46 y.o. female presenting with agitation, given 5 mg of Haldol by EMS.  Patient is oriented x3 with no acute psychosis.  No suicidal ideation or homicidal ideation she states that she has a headache, she got a nonfocal neurologic exam.  Patient will be given ODT Zofran, p.o. challenged and will call her sister to pick her up  Patient has been able to call a family member but she said they will not be able to pick her up.  Case signed out to PA Ward at shift change.  Final Clinical Impressions(s) / ED Diagnoses   Final diagnoses:  Alcoholic intoxication without complication [facility])    ED Discharge Orders    None       [name] 03/30/17 0355    [name], MD 03/30/17 [phone]

## 2017-03-30 NOTE — ED Notes (Signed)
[facility] [employer] notified of pts request for self-pay taxi service.

## 2017-03-30 NOTE — Discharge Instructions (Signed)
Decrease alcohol consumption.   Return to ER for new or worsening symptoms, any additional concerns.

## 2017-03-30 NOTE — ED Provider Notes (Signed)
Care assumed from previous provider PA Pisciotta. Please see note for further details. Case discussed, plan agreed upon. Briefly, patient here for alcohol intoxication. PO challenged and feels well now. Will need ride home. Nursing has tried to get in touch with family who would not come pick patient up.   Patient observed in ED. On re-evaluation, she agrees to get taxi and feels comfortable with this plan. Family aware and comfortable with this. Patient discharged from ED in satisfactory condition.    Bonnie Conrad, Ozella Almond, PA-C 03/30/17 9357    [name], MD 03/30/17 [phone]

## 2017-03-30 NOTE — ED Notes (Signed)
Pt denies nausea at this time. Pt was able to reach her family member via phone but she does not know if they can pick her up because they are "busy".

## 2017-12-15 ENCOUNTER — Encounter (HOSPITAL_COMMUNITY): Payer: Self-pay

## 2017-12-15 ENCOUNTER — Emergency Department (HOSPITAL_COMMUNITY): Payer: Medicaid Other

## 2017-12-15 ENCOUNTER — Other Ambulatory Visit: Payer: Self-pay

## 2017-12-15 ENCOUNTER — Emergency Department (HOSPITAL_COMMUNITY)
Admission: EM | Admit: 2017-12-15 | Discharge: 2017-12-16 | Disposition: A | Discharge status: Home / Self Care | Payer: Medicaid Other | Attending: Emergency Medicine | Admitting: Emergency Medicine

## 2017-12-15 DIAGNOSIS — K529 Noninfective gastroenteritis and colitis, unspecified: Secondary | ICD-10-CM | POA: Insufficient documentation

## 2017-12-15 DIAGNOSIS — N3001 Acute cystitis with hematuria: Secondary | ICD-10-CM | POA: Diagnosis not present

## 2017-12-15 DIAGNOSIS — R1033 Periumbilical pain: Secondary | ICD-10-CM | POA: Diagnosis present

## 2017-12-15 LAB — CBC WITH DIFFERENTIAL/PLATELET
Basophils Absolute: 0 10*3/uL (ref 0.0–0.1)
Basophils Relative: 0 %
Eosinophils Absolute: 0.2 10*3/uL (ref 0.0–0.7)
Eosinophils Relative: 2 %
HCT: 38.5 % (ref 36.0–46.0)
Hemoglobin: 13.1 g/dL (ref 12.0–15.0)
LYMPHS ABS: 3.3 10*3/uL (ref 0.7–4.0)
LYMPHS PCT: 40 %
MCH: 31 pg (ref 26.0–34.0)
MCHC: 34 g/dL (ref 30.0–36.0)
MCV: 91.2 fL (ref 78.0–100.0)
Monocytes Absolute: 0.7 10*3/uL (ref 0.1–1.0)
Monocytes Relative: 8 %
Neutro Abs: 4.1 10*3/uL (ref 1.7–7.7)
Neutrophils Relative %: 50 %
Platelets: 276 10*3/uL (ref 150–400)
RBC: 4.22 MIL/uL (ref 3.87–5.11)
RDW: 14.2 % (ref 11.5–15.5)
WBC: 8.3 10*3/uL (ref 4.0–10.5)

## 2017-12-15 LAB — COMPREHENSIVE METABOLIC PANEL
ALT: 15 U/L (ref 0–44)
AST: 18 U/L (ref 15–41)
Albumin: 4.2 g/dL (ref 3.5–5.0)
Alkaline Phosphatase: 59 U/L (ref 38–126)
Anion gap: 11 (ref 5–15)
BUN: 16 mg/dL (ref 6–20)
CALCIUM: 9.3 mg/dL (ref 8.9–10.3)
CO2: 22 mmol/L (ref 22–32)
CREATININE: 0.61 mg/dL (ref 0.44–1.00)
Chloride: 110 mmol/L (ref 98–111)
GFR calc non Af Amer: >60 mL/min (ref >60)
Glucose, Bld: 94 mg/dL (ref 70–99)
Potassium: 3.8 mmol/L (ref 3.5–5.1)
SODIUM: 143 mmol/L (ref 135–145)
Total Bilirubin: 0.6 mg/dL (ref 0.3–1.2)
Total Protein: 7.1 g/dL (ref 6.5–8.1)

## 2017-12-15 LAB — LIPASE, BLOOD: Lipase: 37 U/L (ref 11–51)

## 2017-12-15 MED ORDER — MORPHINE SULFATE (PF) 4 MG/ML IV SOLN
4.0000 mg | Freq: Once | INTRAVENOUS | Status: AC
  Administered 2017-12-15: 4 mg via INTRAVENOUS
  Filled 2017-12-15: qty 1

## 2017-12-15 MED ORDER — GI COCKTAIL ~~LOC~~
30.0000 mL | Freq: Once | ORAL | Status: AC
  Administered 2017-12-15: 30 mL via ORAL
  Filled 2017-12-15: qty 30

## 2017-12-15 MED ORDER — FAMOTIDINE 20 MG PO TABS
40.0000 mg | ORAL_TABLET | Freq: Once | ORAL | Status: AC
  Administered 2017-12-15: 40 mg via ORAL
  Filled 2017-12-15: qty 2

## 2017-12-15 MED ORDER — ONDANSETRON HCL 4 MG/2ML IJ SOLN
4.0000 mg | Freq: Once | INTRAMUSCULAR | Status: AC
  Administered 2017-12-15: 4 mg via INTRAVENOUS
  Filled 2017-12-15: qty 2

## 2017-12-15 MED ORDER — IOPAMIDOL (ISOVUE-300) INJECTION 61%
INTRAVENOUS | Status: AC
  Filled 2017-12-15: qty 100

## 2017-12-15 NOTE — ED Notes (Signed)
Bed: WLPT2 Expected date:  Expected time:  Means of arrival:  Comments: 

## 2017-12-15 NOTE — ED Triage Notes (Signed)
Pt presents to ED from home for ABD pain. Pt reports that the pain started 3 days ago, but got worse today. Pt endorses nausea, but no vomiting. Pain is RUQ and across to LUQ.

## 2017-12-15 NOTE — ED Provider Notes (Signed)
[facility] DEPT Provider Note   CSN: [id number] Arrival date & time: 12/15/17  2139     History   Chief Complaint Chief Complaint  Patient presents with  . Abdominal Pain    HPI Bonnie Conrad is a 47 y.o. female is here for evaluation of abdominal pain.  Pain is to the area around her umbilicus and epigastrium.  Described as needle, sharp, constant, non radiating.  Onset 9 years ago but acutely worsening 3 days ago.  States she was once told she had a hernia in this area, typically has daily abdominal discomfort there but the pain has gotten worse in the last 3 days.  Aggravated by movement, coughing, walking, palpation.  Pain is also worse after eating.  Took Tylenol at 3 PM today and felt some relief.  Associated symptoms include subjective fevers, nausea, increased belching.  No history of abdominal surgeries.  Remembers someone telling her she might have stomach ulcers but has never had EGDs to confirm.  LMP 9/1.  Denies associated vomiting, hematemesis, melena, radiation into the chest, shortness of breath, cough, diarrhea, constipation, dysuria.  No EtOH use.  Occasional cigarette use.  No heavy use of NSAIDs.  HPI  Past Medical History:  Diagnosis Date  . Abdominal hernia   . Depression    hosp during pregnancy at [facility] health  . Hernia of abdominal cavity 2009  . Ovarian cyst     Patient Active Problem List   Diagnosis Date Noted  . Major depressive disorder, recurrent episode, severe, with psychotic behavior ([place]) 04/03/2011  . Severe major depression with psychotic features ([place]) 03/06/2011  . Hernia of abdominal cavity 03/06/2011    Past Surgical History:  Procedure Laterality Date  . DILATION AND CURETTAGE OF UTERUS    . TUBAL LIGATION  07/25/2011   Procedure: POST PARTUM TUBAL LIGATION;  Surgeon: Mora Bellman, MD;  Location: [facility];  Service: Gynecology;  Laterality: Bilateral;     OB History    Gravida  9   Para    6   Term  6   Preterm  0   AB  3   Living  6     SAB  3   TAB  0   Ectopic  0   Multiple  0   Live Births  5            Home Medications    Prior to Admission medications   Medication Sig Start Date End Date Taking? Authorizing Provider  cephALEXin (KEFLEX) 500 MG capsule Take 1 capsule (500 mg total) by mouth 2 (two) times daily for 7 days. 12/16/17 12/23/17  Kinnie Feil, PA-C  HYDROcodone-acetaminophen (NORCO/VICODIN) 5-325 MG per tablet Take 1-2 tablets by mouth every 6 (six) hours as needed for moderate pain or severe pain. Patient not taking: Reported on 04/30/2014 04/08/14   Blanchie Dessert, MD  ondansetron (ZOFRAN ODT) 4 MG disintegrating tablet Take 1 tablet (4 mg total) by mouth every 8 (eight) hours as needed for nausea or vomiting. 12/16/17   Kinnie Feil, PA-C    Family History Family History  Problem Relation Age of Onset  . Hypertension Mother   . Anesthesia problems Neg Hx   . Hypotension Neg Hx   . Malignant hyperthermia Neg Hx   . Pseudochol deficiency Neg Hx     Social History Social History   Tobacco Use  . Smoking status: Never Smoker  . Smokeless tobacco: Never Used  Substance Use  Topics  . Alcohol use: No  . Drug use: No     Allergies   Vicodin [hydrocodone-acetaminophen]   Review of Systems Review of Systems  Constitutional: Positive for fever.  Gastrointestinal: Positive for abdominal pain and nausea.       Increased belching  All other systems reviewed and are negative.    Physical Exam Updated Vital Signs BP (!) 141/79   Pulse 74   Temp 99 F (37.2 C) (Oral)   Resp 18   Ht 5\' 1"  (1.549 m)   Wt 52.2 kg   SpO2 98%   BMI 21.73 kg/m   Physical Exam  Constitutional: She is oriented to person, place, and time. She appears well-developed and well-nourished.  Non toxic  HENT:  Head: Normocephalic and atraumatic.  Nose: Nose normal.  Eyes: Pupils are equal, round, and reactive to light. Conjunctivae  and EOM are normal.  Neck: Normal range of motion.  Cardiovascular: Normal rate and regular rhythm.  Pulmonary/Chest: Effort normal and breath sounds normal.  Abdominal: Soft. Bowel sounds are normal. There is tenderness in the epigastric area and periumbilical area. A hernia is present.  Tenderness over small, soft, reducible ventral hernia above umbilicus without skin changes.  TTP to egigastrium. No G/R/R. No suprapubic or CVA tenderness. Negative Murphy's and McBurney's. Active BS to lower quadrants.   Musculoskeletal: Normal range of motion.  Neurological: She is alert and oriented to person, place, and time.  Skin: Skin is warm and dry. Capillary refill takes less than 2 seconds.  Psychiatric: She has a normal mood and affect. Her behavior is normal.  Nursing note and vitals reviewed.    ED Treatments / Results  Labs (all labs ordered are listed, but only abnormal results are displayed) Labs Reviewed  URINALYSIS, ROUTINE W REFLEX MICROSCOPIC - Abnormal; Notable for the following components:      Result Value   Color, Urine RED (*)    APPearance TURBID (*)    Hgb urine dipstick LARGE (*)    Ketones, ur TRACE (*)    Protein, ur >300 (*)    Nitrite POSITIVE (*)    Leukocytes, UA SMALL (*)    All other components within normal limits  URINALYSIS, MICROSCOPIC (REFLEX) - Abnormal; Notable for the following components:   Bacteria, UA MANY (*)    All other components within normal limits  CBC WITH DIFFERENTIAL/PLATELET  COMPREHENSIVE METABOLIC PANEL  LIPASE, BLOOD  I-STAT BETA HCG BLOOD, ED ([facility], [facility], [facility] ONLY)    EKG None  Radiology Ct Abdomen Pelvis W Contrast  Addendum Date: 12/16/2017   ADDENDUM REPORT: 12/16/2017 02:02 ADDENDUM: Additional chronic findings of rectus diastasis, supraumbilical fat containing hernia and tiny umbilical fat containing hernia appears relatively stable in appearance. Electronically Signed   By: Ashley Royalty M.D.   On: 12/16/2017 02:02   Result  Date: 12/16/2017 CLINICAL DATA:  Abdominal pain starting 3 days ago worsening today. Patient endorses nausea without vomiting. Pain is right upper quadrant extending to left upper quadrant. EXAM: CT ABDOMEN AND PELVIS WITH CONTRAST TECHNIQUE: Multidetector CT imaging of the abdomen and pelvis was performed using the standard protocol following bolus administration of intravenous contrast. CONTRAST:  136mL ISOVUE-300 IOPAMIDOL (ISOVUE-300) INJECTION 61% COMPARISON:  06/27/2013 FINDINGS: Lower chest: Normal size heart. Dependent atelectasis. No active pulmonary disease. Hepatobiliary: Mild steatosis of the liver. No space-occupying mass. Normal gallbladder. No biliary dilatation. Pancreas: Normal Spleen: Normal Adrenals/Urinary Tract: Normal bilateral adrenal glands. Simple cyst in the lower pole the right kidney  measuring 11 mm. No nephrolithiasis nor obstructive uropathy. No hydroureteronephrosis. The urinary bladder is unremarkable for the degree of distention. Stomach/Bowel: The stomach is decompressed. Duodenal sweep and ligament of Treitz are normal in appearance. Mild diffuse transmural thickening of the jejunum suspicious for enteritis. No mechanical bowel obstruction. Normal appendix, terminal and distal ileum. Increased stool burden within the colon. Vascular/Lymphatic: No significant vascular findings are present. No enlarged abdominal or pelvic lymph nodes. Reproductive: Stable appearance of the uterus.  No adnexal mass. Other: No free air.  No abdominopelvic ascites. Musculoskeletal: No acute or significant osseous findings. IMPRESSION: 1. Mild steatosis of the liver. 2. Normal gallbladder without biliary dilatation or secondary signs of acute cholecystitis. 3. Mild diffuse transmural thickening of jejunum suspicious for small bowel enteritis. No mechanical bowel obstruction. 4. Normal appendix. 5. Stable cyst in the lower pole the right kidney measuring 1.1 cm. Electronically Signed: By: Ashley Royalty M.D.  On: 12/16/2017 01:26    Procedures Procedures (including critical care time)  Medications Ordered in ED Medications  iopamidol (ISOVUE-300) 61 % injection (has no administration in time range)  ondansetron (ZOFRAN) injection 4 mg (4 mg Intravenous Given 12/15/17 2316)  morphine 4 MG/ML injection 4 mg (4 mg Intravenous Given 12/15/17 2317)  famotidine (PEPCID) tablet 40 mg (40 mg Oral Given 12/15/17 2319)  gi cocktail (Maalox,Lidocaine,Donnatal) (30 mLs Oral Given 12/15/17 2319)  iopamidol (ISOVUE-300) 61 % injection 100 mL (100 mLs Intravenous Contrast Given 12/16/17 0102)     Initial Impression / Assessment and Plan / ED Course  I have reviewed the triage vital signs and the nursing notes.  Pertinent labs & imaging results that were available during my care of the patient were reviewed by me and considered in my medical decision making (see chart for details).  Clinical Course as of Dec 17 247  Tue Dec 16, 2017  0148 Nitrite(!): POSITIVE [CG]  0148 Bacteria, UA(!): MANY [CG]  0149 IMPRESSION: 1. Mild steatosis of the liver. 2. Normal gallbladder without biliary dilatation or secondary signs of acute cholecystitis. 3. Mild diffuse transmural thickening of jejunum suspicious for small bowel enteritis. No mechanical bowel obstruction. 4. Normal appendix. 5. Stable cyst in the lower pole the right kidney measuring 1.1 cm.    CT ABDOMEN PELVIS W CONTRAST [CG]  0155 Spoke to Dr Randel Pigg regarding CTAP, he will addend. Periumbilical hernias unchanged, small w/o signs of complications    [CG]  8938 Reevaluated patient, she feels better.  Successful oral challenge.  Will discharge with Zofran and antibiotics for UTI.  She is in agreement.   [CG]    Clinical Course User Index [CG] Kinnie Feil, PA-C    47 year old female with known ventral hernia is here for upper abdominal pain.  Concern for GERD versus PUD versus pancreatitis versus ventral hernia complication.  She endorses lately  her hernia has seemed bigger and harder however on exam it is soft, easily reducible but tender.  No peritonitis.  Will obtain screening labs, reassess.  1017: Lab work unremarkable.  Will obtain CTAP to rule out intra-abdominal pathology including hernia complication.  0245:  Small bowel enteritis on CTAP, unchanged hernias without complication.  UA with positive nitrites.  Pt tolerated PO and felt better after medicines.  No emesis. Discussed work up with pt. Plan to dc with zofran, keflex, supportive care for likely viral enteritis.  Discussed return precautions. She requested general surgery info for hernia repair. Given.   Final Clinical Impressions(s) / ED Diagnoses   Final  diagnoses:  Acute cystitis with hematuria  Enteritis    ED Discharge Orders         Ordered    ondansetron (ZOFRAN ODT) 4 MG disintegrating tablet  Every 8 hours PRN     12/16/17 0227    cephALEXin (KEFLEX) 500 MG capsule  2 times daily     12/16/17 0227           Kinnie Feil, PA-C 12/16/17 0249    Tegeler, Gwenyth Allegra, MD 12/16/17 1431

## 2017-12-16 ENCOUNTER — Emergency Department (HOSPITAL_COMMUNITY): Payer: Medicaid Other

## 2017-12-16 LAB — URINALYSIS, ROUTINE W REFLEX MICROSCOPIC
Bilirubin Urine: NEGATIVE
Glucose, UA: NEGATIVE mg/dL
Nitrite: POSITIVE — AB
PH: 6.5 (ref 5.0–8.0)
Protein, ur: >300 mg/dL — AB
Specific Gravity, Urine: 1.02 (ref 1.005–1.030)

## 2017-12-16 LAB — URINALYSIS, MICROSCOPIC (REFLEX): RBC / HPF: >50 RBC/hpf (ref 0–5)

## 2017-12-16 LAB — I-STAT BETA HCG BLOOD, ED (MC, WL, AP ONLY)

## 2017-12-16 MED ORDER — ONDANSETRON 4 MG PO TBDP: 4.0000 mg | ORAL_TABLET | Freq: Three times a day (TID) | ORAL | 0 refills | Status: DC | PRN

## 2017-12-16 MED ORDER — CEPHALEXIN 500 MG PO CAPS: 500.0000 mg | ORAL_CAPSULE | Freq: Two times a day (BID) | ORAL | 0 refills | Status: AC

## 2017-12-16 MED ORDER — IOPAMIDOL (ISOVUE-300) INJECTION 61%
100.0000 mL | Freq: Once | INTRAVENOUS | Status: AC | PRN
  Administered 2017-12-16: 100 mL via INTRAVENOUS

## 2017-12-16 NOTE — ED Notes (Signed)
Pt was given ice water earlier for PO/fluid challenge; pt tolerated well.

## 2017-12-16 NOTE — Discharge Instructions (Addendum)
You have enteritis which is inflammation of your intestine.  This is likely from a virus. Treatment includes symptom control.  Take zofran for nausea every 8 hours.  Stay well hydrated.  Can take acetaminophen (tylenol) for pain.    You were found to have a urinary tract infection. Take keflex for this.   Return for worsening abdominal pain, blood in vomiting, blood in stool, fever greater than 100.83F, abdominal swelling.   See contact information for surgery clinic to discuss possible surgery for hernia  Tiene enteritis que es inflamacin del intestino.  Esto es causado por un virus. El tratamiento incluye el control de los sntomas.  Tome zofran para las nuseas cada 8 horas.  Mantngase bien hidratada.  Puede tomar acetaminophen (tylenol) para el dolor.    EL anlisis de orina tiene bacteria.  Tome keflex (antibiotico) para esta infeccion.   Regrese al departamento de emergencia por empeoramiento del dolor abdominal, sangre en los vmitos, sangre en las [place], fiebre superior a 100.83F, hinchazn abdominal.   Ver informacin de contacto para la clnica de ciruga para discutir la posible ciruga para la hernia

## 2018-01-19 DIAGNOSIS — M6208 Separation of muscle (nontraumatic), other site: Secondary | ICD-10-CM | POA: Diagnosis not present

## 2018-01-19 DIAGNOSIS — K529 Noninfective gastroenteritis and colitis, unspecified: Secondary | ICD-10-CM | POA: Diagnosis not present

## 2018-01-19 DIAGNOSIS — K439 Ventral hernia without obstruction or gangrene: Secondary | ICD-10-CM | POA: Diagnosis not present

## 2018-02-10 ENCOUNTER — Encounter: Payer: Self-pay | Admitting: Family Medicine

## 2018-02-10 ENCOUNTER — Encounter: Payer: Self-pay | Admitting: Gastroenterology

## 2018-02-10 ENCOUNTER — Ambulatory Visit: Payer: Medicaid Other | Attending: Family Medicine | Admitting: Family Medicine

## 2018-02-10 VITALS — BP 128/72 | HR 69 | Temp 98.0°F | Resp 18 | Ht <= 58 in | Wt 113.4 lb

## 2018-02-10 DIAGNOSIS — R110 Nausea: Secondary | ICD-10-CM

## 2018-02-10 DIAGNOSIS — N83209 Unspecified ovarian cyst, unspecified side: Secondary | ICD-10-CM | POA: Diagnosis not present

## 2018-02-10 DIAGNOSIS — F329 Major depressive disorder, single episode, unspecified: Secondary | ICD-10-CM | POA: Insufficient documentation

## 2018-02-10 DIAGNOSIS — K439 Ventral hernia without obstruction or gangrene: Secondary | ICD-10-CM | POA: Insufficient documentation

## 2018-02-10 DIAGNOSIS — R1032 Left lower quadrant pain: Secondary | ICD-10-CM | POA: Diagnosis not present

## 2018-02-10 DIAGNOSIS — R5383 Other fatigue: Secondary | ICD-10-CM | POA: Insufficient documentation

## 2018-02-10 DIAGNOSIS — R1013 Epigastric pain: Secondary | ICD-10-CM | POA: Diagnosis not present

## 2018-02-10 DIAGNOSIS — Z23 Encounter for immunization: Secondary | ICD-10-CM | POA: Diagnosis not present

## 2018-02-10 DIAGNOSIS — Z885 Allergy status to narcotic agent status: Secondary | ICD-10-CM | POA: Insufficient documentation

## 2018-02-10 DIAGNOSIS — R1084 Generalized abdominal pain: Secondary | ICD-10-CM | POA: Diagnosis not present

## 2018-02-10 DIAGNOSIS — K529 Noninfective gastroenteritis and colitis, unspecified: Secondary | ICD-10-CM

## 2018-02-10 DIAGNOSIS — R300 Dysuria: Secondary | ICD-10-CM

## 2018-02-10 LAB — POCT URINALYSIS DIP (CLINITEK)
Bilirubin, UA: NEGATIVE
Glucose, UA: NEGATIVE mg/dL
Ketones, POC UA: NEGATIVE mg/dL
Leukocytes, UA: NEGATIVE
Nitrite, UA: NEGATIVE
POC,PROTEIN,UA: NEGATIVE
Spec Grav, UA: 1.025
Urobilinogen, UA: 0.2 U/dL
pH, UA: 5.5

## 2018-02-10 MED ORDER — OMEPRAZOLE 20 MG PO CPDR: 20.0000 mg | DELAYED_RELEASE_CAPSULE | Freq: Two times a day (BID) | ORAL | 3 refills | Status: DC

## 2018-02-10 NOTE — Progress Notes (Signed)
Subjective:    Patient ID: Bonnie Conrad, female    DOB: 1971-02-02, 47 y.o.   MRN: [id number]   Due to a language barrier, a video interpreter service was used at today's visit.  HPI 47 yo female new to the practice.  Review of chart, patient has history of a ventral abdominal hernia and is status post emergency department visit on 12/15/2017 secondary to abdominal pain.  Patient had urinalysis which was positive for leukocytes, nitrites and blood.  Patient was treated for acute cystitis with hematuria.  Patient has CT scan which showed a supraumbilical hernia containing fat as well as a tiny umbilical hernia which were stable in appearance per radiology.  On CT scan, patient with mild steatosis of the liver, normal gallbladder, mild diffuse transmural thickening of the jejunum suspicious for small bowel enteritis, normal appendix and patient with a stable cyst in the lower pole of the right kidney.      At today's visit, patient with complaint of continued mid abdominal pain which is been present for about 10 years and patient also with complaint of left lower quadrant pain that is been present for about 3 weeks.  Patient reports that the left lower quadrant pain comes and goes and can be sharp at times.  Pain can be about an 8 on a 0-to-10 scale.  Patient also with complaint of nausea as well as some burping and belching.  Patient has had some recent increase in urinary frequency as well as burning with urination.  Patient states that she is currently on her menses.  Patient reports that she also feels a similar burning sensation during her period as she does with the presence of a urinary tract infection.  Patient denies any fever or chills.  Patient does feel tired.  Patient denies any current diarrhea or constipation.  Patient states that she has seen some blood in the stool but the last time was about 3 to 4 weeks ago.  Patient denies any black stools.  Patient did recently see [facility] and according to the paperwork that the patient had with her, they did not feel that patient needed repair of the ventral abdominal hernias at this time and did not think that the hernias were contributing to patient's current issues with abdominal pain. Past Medical History:  Diagnosis Date  . Abdominal hernia   . Depression    hosp during pregnancy at [facility] health  . Hernia of abdominal cavity 2009  . Ovarian cyst    Past Surgical History:  Procedure Laterality Date  . DILATION AND CURETTAGE OF UTERUS    . TUBAL LIGATION  07/25/2011   Procedure: POST PARTUM TUBAL LIGATION;  Surgeon: Mora Bellman, MD;  Location: [facility];  Service: Gynecology;  Laterality: Bilateral;   Family History  Problem Relation Age of Onset  . Hypertension Mother   . Anesthesia problems Neg Hx   . Hypotension Neg Hx   . Malignant hyperthermia Neg Hx   . Pseudochol deficiency Neg Hx    Social History   Tobacco Use  . Smoking status: Never Smoker  . Smokeless tobacco: Never Used  Substance Use Topics  . Alcohol use: No  . Drug use: No   Allergies  Allergen Reactions  . Vicodin [Hydrocodone-Acetaminophen] Hives and Itching     Review of Systems  Constitutional: Positive for fatigue. Negative for chills, diaphoresis and fever.  HENT: Negative for sore throat and trouble swallowing.   Respiratory: Negative for cough  and shortness of breath.   Cardiovascular: Negative for chest pain, palpitations and leg swelling.  Gastrointestinal: Positive for abdominal pain, blood in stool and nausea. Negative for constipation, diarrhea and vomiting.  Genitourinary: Positive for dysuria and frequency. Negative for pelvic pain, vaginal discharge and vaginal pain.  Musculoskeletal: Negative for arthralgias, back pain, gait problem, joint swelling and myalgias.  Neurological: Negative for dizziness and headaches.       Objective:   Physical Exam BP 128/72   Pulse 69   Temp 98 F (36.7 C) (Oral)    Resp 18   Ht 4\' 10"  (1.473 m)   Wt 113 lb 6.4 oz (51.4 kg)   SpO2 98%   BMI 23.70 kg/m Nurse's notes and vital signs reviewed General-well-nourished, well-developed female in no acute distress Lungs-clear to auscultation bilaterally Cardiovascular-regular rate and rhythm Abdomen-soft, patient with complaint of tenderness with palpation of most of the abdomen but especially with epigastric palpation and left lower quadrant palpation, no rebound or guarding patient does have withdrawal with attempts to palpate the upper and mid abdomen.  No evidence of incarcerated ventral hernias on exam. Back- patient with complaint of mild right CVA tenderness Extremities-no edema      Assessment & Plan:  1. Epigastric pain Patient with some epigastric pain on exam and complaint of reflux type symptoms.  Patient will have CBC to look for any inflammation or blood loss and patient is being placed on omeprazole 20 mg twice daily to reduce stomach acid.  Patient should also avoid late night eating and avoid spicy/greasy foods.  Patient has had prior lipase levels done in the emergency department for similar pain and these were within normal.  Patient did not have any rebound and no sensation of pain going from her abdomen to her back which would be more concerning and might require more immediate imaging to look for perforated ulcer, pancreatitis or an issue with an abdominal aneurysm.  Patient is being referred to GI for further evaluation of her abdominal pain. - CBC with Differential - omeprazole (PRILOSEC) 20 MG capsule; Take 1 capsule (20 mg total) by mouth 2 (two) times daily before a meal. To reduce stomach acid  Dispense: 60 capsule; Refill: 3  2. Left lower quadrant pain Patient with left lower quadrant pain.  Patient had CT scan in the emergency department recently which did not show any evidence of diverticulitis but did show possible enteritis of the small bowel.  Patient does not appear to be  having any active symptoms other than her pain.  Patient will have stat referral to GI for further evaluation and treatment.  Patient will have CBC to look for inflammation/blood loss. - Ambulatory referral to Gastroenterology - CBC with Differential  3. Generalized abdominal pain Patient with complaint of generalized abdominal pain on examination.  Patient does have a history of ventral hernias but apparently was recently evaluated at [facility] surgery and these hernias were not felt to be the cause of her generalized abdominal pain and that surgical repair was not warranted at this time.  Patient is being referred to gastroenterology for further evaluation and treatment.  Patient did have urinalysis done in follow-up of her lower abdominal pain and left lower quadrant pain however this did not show any leukocytes or nitrites.  Patient did have blood in the urine but patient also reported that she was currently on her menses.  4. Enteritis Patient had recent CT scan during visit to the emergency department with possible small  bowel enteritis.  Patient has some nausea but no vomiting, no diarrhea or loose stools, no fever or chills but does continue to have generalized and left lower quadrant pain.  Patient will be referred to gastroenterology for further evaluation and treatment and CBC will be done to look for inflammation or blood loss.  If continued abdominal pain, patient may also need pelvic ultrasound to look for possible ovarian cyst or other reproductive system abnormality that may be causing or contributing to her pain.  Patient has had prior tubal ligation and does not believe that she could currently be pregnant. - Ambulatory referral to Gastroenterology - CBC with Differential Addendum: patient called on 02/11/18 and left message requesting medication for pain.  Prescription sent to patient's pharmacy for metronidazole and Cipro in case patient's abdominal pain is related to a  diverticulitis or enteritis.  Patient does not have any current diarrhea or blood in the stool.  GI referral is pending.  Patient should return for further evaluation if her symptoms worsen or are not improving.  Patient should seek emergency department follow-up during nonclinic hours for any acute worsening of her abdominal pain.  5. Nausea Patient was placed on omeprazole 20 mg twice daily to help reduce stomach acid to see if this helps with patient's nausea. - omeprazole (PRILOSEC) 20 MG capsule; Take 1 capsule (20 mg total) by mouth 2 (two) times daily before a meal. To reduce stomach acid  Dispense: 60 capsule; Refill: 3  6. Dysuria Patient with complaint of dysuria and with lower abdominal pain.  Patient had urinalysis done at today's visit but this did not have any nitrites or leukocytes.  There was the presence of moderate blood however patient is currently on her menses. - POCT URINALYSIS DIP (CLINITEK)  7. Need for immunization against influenza Patient was offered and agreed to have influenza immunization at today's visit - Flu Vaccine QUAD 36+ mos IM  An After Visit Summary was printed and given to the patient.  Return in about 4 weeks (around 03/10/2018).  Sooner if not improving or follow-up at the emergency department if any acute worsening of abdominal pain, blood in the stools, intractable nausea and vomiting or any concerns

## 2018-02-10 NOTE — Progress Notes (Signed)
Flu shot: yes Pain: abdomin pain, few days, 6  [facility] was [facility] from [facility].

## 2018-02-11 ENCOUNTER — Telehealth: Payer: Self-pay

## 2018-02-11 LAB — CBC WITH DIFFERENTIAL/PLATELET
Basophils Absolute: 0 x10E3/uL (ref 0.0–0.2)
Basos: 0 %
EOS (ABSOLUTE): 0.1 x10E3/uL (ref 0.0–0.4)
Eos: 1 %
Hematocrit: 37.5 % (ref 34.0–46.6)
Hemoglobin: 12.6 g/dL (ref 11.1–15.9)
Immature Grans (Abs): 0 x10E3/uL (ref 0.0–0.1)
Immature Granulocytes: 0 %
Lymphocytes Absolute: 2.4 x10E3/uL (ref 0.7–3.1)
Lymphs: 32 %
MCH: 30.4 pg (ref 26.6–33.0)
MCHC: 33.6 g/dL (ref 31.5–35.7)
MCV: 91 fL (ref 79–97)
Monocytes Absolute: 0.5 x10E3/uL (ref 0.1–0.9)
Monocytes: 7 %
Neutrophils Absolute: 4.4 x10E3/uL (ref 1.4–7.0)
Neutrophils: 60 %
Platelets: 281 x10E3/uL (ref 150–450)
RBC: 4.14 x10E6/uL (ref 3.77–5.28)
RDW: 13.2 % (ref 12.3–15.4)
WBC: 7.4 x10E3/uL (ref 3.4–10.8)

## 2018-02-11 MED ORDER — CIPROFLOXACIN HCL 500 MG PO TABS: 500.0000 mg | ORAL_TABLET | Freq: Two times a day (BID) | ORAL | 0 refills | Status: DC

## 2018-02-11 MED ORDER — METRONIDAZOLE 500 MG PO TABS: 500.0000 mg | ORAL_TABLET | Freq: Two times a day (BID) | ORAL | 0 refills | Status: DC

## 2018-02-11 NOTE — Telephone Encounter (Signed)
-----   Message from Antony Blackbird, MD sent at 02/11/2018 12:54 PM EDT ----- Please notify patient of normal complete blood count

## 2018-02-11 NOTE — Telephone Encounter (Signed)
[place] interpreter Rica Mote OA#[id number]  Patient was called, answered, verified dob and was given most recent lab results. Patient verbalized understandung, and stated she wanted something for pain. Please send to [facility] on [address].

## 2018-02-11 NOTE — Telephone Encounter (Signed)
Please let patient know that 2 prescriptions were sent into her pharmacy for Cipro and metronidazole in case she has any infection within the colon.  Please asked patient if she is getting any relief of the pain with over-the-counter Tylenol or use of nonsteroidal anti-inflammatories.  With patient's nausea however she may wish to avoid the use of nonsteroidal anti-inflammatories and just take Tylenol as needed for pain.  Please see if patient's GI follow-up has been scheduled.  Please asked patient to return to clinic or go to the emergency room if she is having any worsening of her abdominal pain

## 2018-02-13 NOTE — Telephone Encounter (Signed)
Patient was called, [place] interpreter Jesus [phone], unable to leave vm 2 attempts.

## 2018-03-05 ENCOUNTER — Other Ambulatory Visit (INDEPENDENT_AMBULATORY_CARE_PROVIDER_SITE_OTHER): Payer: Medicaid Other

## 2018-03-05 ENCOUNTER — Ambulatory Visit: Payer: Medicaid Other | Admitting: Gastroenterology

## 2018-03-05 ENCOUNTER — Encounter: Payer: Self-pay | Admitting: Gastroenterology

## 2018-03-05 VITALS — BP 128/82 | HR 79 | Ht 60.0 in | Wt 111.0 lb

## 2018-03-05 DIAGNOSIS — R109 Unspecified abdominal pain: Secondary | ICD-10-CM | POA: Diagnosis not present

## 2018-03-05 DIAGNOSIS — K219 Gastro-esophageal reflux disease without esophagitis: Secondary | ICD-10-CM

## 2018-03-05 DIAGNOSIS — K649 Unspecified hemorrhoids: Secondary | ICD-10-CM | POA: Diagnosis not present

## 2018-03-05 DIAGNOSIS — K625 Hemorrhage of anus and rectum: Secondary | ICD-10-CM | POA: Diagnosis not present

## 2018-03-05 DIAGNOSIS — R935 Abnormal findings on diagnostic imaging of other abdominal regions, including retroperitoneum: Secondary | ICD-10-CM | POA: Diagnosis not present

## 2018-03-05 DIAGNOSIS — G8929 Other chronic pain: Secondary | ICD-10-CM

## 2018-03-05 DIAGNOSIS — R112 Nausea with vomiting, unspecified: Secondary | ICD-10-CM | POA: Diagnosis not present

## 2018-03-05 DIAGNOSIS — R1084 Generalized abdominal pain: Secondary | ICD-10-CM | POA: Diagnosis not present

## 2018-03-05 LAB — HEPATIC FUNCTION PANEL
ALBUMIN: 4.8 g/dL (ref 3.5–5.2)
ALK PHOS: 54 U/L (ref 39–117)
ALT: 13 U/L (ref 0–35)
AST: 13 U/L (ref 0–37)
Bilirubin, Direct: 0.1 mg/dL (ref 0.0–0.3)
TOTAL PROTEIN: 7.7 g/dL (ref 6.0–8.3)
Total Bilirubin: 0.8 mg/dL (ref 0.2–1.2)

## 2018-03-05 LAB — CBC
HCT: 40 % (ref 36.0–46.0)
Hemoglobin: 13.3 g/dL (ref 12.0–15.0)
MCHC: 33.3 g/dL (ref 30.0–36.0)
MCV: 91.8 fl (ref 78.0–100.0)
Platelets: 251 10*3/uL (ref 150.0–400.0)
RBC: 4.36 Mil/uL (ref 3.87–5.11)
RDW: 14.4 % (ref 11.5–15.5)
WBC: 8 10*3/uL (ref 4.0–10.5)

## 2018-03-05 LAB — BASIC METABOLIC PANEL
BUN: 11 mg/dL (ref 6–23)
CO2: 25 mEq/L (ref 19–32)
CREATININE: 0.68 mg/dL (ref 0.40–1.20)
Calcium: 9.4 mg/dL (ref 8.4–10.5)
Chloride: 106 mEq/L (ref 96–112)
GFR: 99.19 mL/min (ref >60.00)
Glucose, Bld: 86 mg/dL (ref 70–99)
Potassium: 3.4 mEq/L — ABNORMAL LOW (ref 3.5–5.1)
Sodium: 140 mEq/L (ref 135–145)

## 2018-03-05 LAB — IBC PANEL
IRON: 109 ug/dL (ref 42–145)
SATURATION RATIOS: 23.1 % (ref 20.0–50.0)
TRANSFERRIN: 337 mg/dL (ref 212.0–360.0)

## 2018-03-05 LAB — PROTIME-INR
INR: 1.1 ratio — ABNORMAL HIGH (ref 0.8–1.0)
Prothrombin Time: 12.5 s (ref 9.6–13.1)

## 2018-03-05 LAB — CORTISOL: Cortisol, Plasma: 2.2 ug/dL

## 2018-03-05 LAB — LIPASE: LIPASE: 9 U/L — AB (ref 11.0–59.0)

## 2018-03-05 LAB — TSH: TSH: 1.8 u[IU]/mL (ref 0.35–4.50)

## 2018-03-05 LAB — HIGH SENSITIVITY CRP: CRP, High Sensitivity: 0.31 mg/L (ref 0.000–5.000)

## 2018-03-05 LAB — IGA: IgA: 293 mg/dL (ref 68–378)

## 2018-03-05 MED ORDER — HYDROCORTISONE 2.5 % RE CREA: 1.0000 "application " | TOPICAL_CREAM | Freq: Two times a day (BID) | RECTAL | 2 refills | Status: DC

## 2018-03-05 MED ORDER — OMEPRAZOLE 40 MG PO CPDR: 40.0000 mg | DELAYED_RELEASE_CAPSULE | Freq: Two times a day (BID) | ORAL | 3 refills | Status: DC

## 2018-03-05 MED ORDER — PEG 3350-KCL-NA BICARB-NACL 420 G PO SOLR: 4000.0000 mL | ORAL | 0 refills | Status: DC

## 2018-03-05 MED ORDER — HYDROCORTISONE ACETATE 25 MG RE SUPP: 25.0000 mg | Freq: Two times a day (BID) | RECTAL | 0 refills | Status: DC

## 2018-03-05 NOTE — Progress Notes (Signed)
[place] VISIT   Primary Care Provider Antony Blackbird, MD [place] [address] [phone]  Referring Provider Antony Blackbird, MD [address], [place] [address] [phone]  Patient Profile: Bonnie Conrad is a 47 y.o. female with a pmh significant for Multiple Gestations, Abdominal Hernia, Ovarian Cysts, hx of MDD/Anxiety, GERD, Chronic Abdominal Pain.  The patient presents to the [facility] for an evaluation and management of problem(s) noted below:  Problem List 1. Gastroesophageal reflux disease, esophagitis presence not specified   2. Chronic generalized abdominal pain   3. Rectal bleeding   4. Non-intractable vomiting with nausea, unspecified vomiting type   5. Abnormal CT of the abdomen   6. Hemorrhoids, unspecified hemorrhoid type     History of Present Illness: This is the patient's first visit to the outpatient [facility] clinic.  Unfortunately she was more than 15 minutes late initial visit time she presents with a more than 10-year history of chronic recurrent abdominal pain.  We did today's interview with my Spanish and with Interpretor services through the video.  She describes at least 8 years worth of persistent pain on a daily basis.  She describes issues of nausea as well as occasional vomiting.  The discomfort she experiences occurs when she is doing her work or housework and is a generalized throughout the abdomen.  The pain can be anywhere from a level 1-10 and is mostly around 8-10 on a daily basis.  Talking with her it seems like she is been trialed on PPIs for short periods and time is currently on omeprazole.  In the past she is been given hydrocodone which she describes giving her dizziness at times but did help with the pain and wonders whether that should be re-started.  She describes issues of dysphagia for years.  She states that solid foods as well as breads can cause  problems with passage of the contents directly into the stomach without having to at times drink water to decrease the risk of regurgitation.  She describes 1 year worse heartburn symptoms that occur throughout the day.  She has constipation described as hard stools occurring 1-2 times per week and she has a diarrheal movement every 1 to 2 months.  To help with her constipation she drinks more juice she does not use fiber supplements.  She is had hemorrhoids in the past about a week ago or so discomfort and bleeding from her bottom was worse.  She has not used anything during this time.  The patient has never had an upper or lower endoscopy.  She is been evaluated in the emergency department as well as by her OB GEN in the last few years with CT scans and emergency department visits.  CT scan most recently from a few months ago showed some abnormalities as noted below.  She recently saw her primary care provider who started her on antibiotics because of abdominal pain issues but this was a weeks after the last CT scan that she had.  She does take nonsteroidals very infrequently.  GI Review of Systems Positive as above including anorexia, loss of appetite, bloating Negative for odynophagia, melena  Review of Systems General: Positive for fatigue and decreased energy; denies fevers/chills/weight loss HEENT: Positive for long-standing blurry vision; denies oral lesions Cardiovascular: At times she has nonradiating nonexertional chest pain occur in her sternal region but not currently today; denies palpitations Pulmonary: Denies shortness of breath/cough Gastroenterological: See HPI Genitourinary: Positive  for recurrent urinary discomfort, urine leakage/incontinence, menstrual pains; denies darkened urine Hematological: Denies easy bruising/bleeding Endocrine: Positive for excessive thirst, excessive urination; denies temperature intolerance Dermatological: Denies jaundice or skin rashes Psychological:  Mood is stable Musculoskeletal: Positive for long-standing arthritis issues she denies new arthralgias   Medications Current Outpatient Medications  Medication Sig Dispense Refill  . omeprazole (PRILOSEC) 20 MG capsule Take 1 capsule (20 mg total) by mouth 2 (two) times daily before a meal. To reduce stomach acid 60 capsule 3  . ondansetron (ZOFRAN ODT) 4 MG disintegrating tablet Take 1 tablet (4 mg total) by mouth every 8 (eight) hours as needed for nausea or vomiting. 20 tablet 0  . hydrocortisone (ANUSOL-HC) 2.5 % rectal cream Place 1 application rectally 2 (two) times daily. 30 g 2  . hydrocortisone (ANUSOL-HC) 25 MG suppository Place 1 suppository (25 mg total) rectally every 12 (twelve) hours. 20 suppository 0  . omeprazole (PRILOSEC) 40 MG capsule Take 1 capsule (40 mg total) by mouth 2 (two) times daily. 60 capsule 3   No current facility-administered medications for this visit.     Allergies Allergies  Allergen Reactions  . Vicodin [Hydrocodone-Acetaminophen] Hives and Itching    Histories Past Medical History:  Diagnosis Date  . Abdominal hernia   . Depression    hosp during pregnancy at [facility]  . Hernia of abdominal cavity 2009  . Ovarian cyst    Past Surgical History:  Procedure Laterality Date  . DILATION AND CURETTAGE OF UTERUS    . TUBAL LIGATION  07/25/2011   Procedure: POST PARTUM TUBAL LIGATION;  Surgeon: Mora Bellman, MD;  Location: [facility];  Service: Gynecology;  Laterality: Bilateral;   Social History   Socioeconomic History  . Marital status: Married    Spouse name: Not on file  . Number of children: Not on file  . Years of education: Not on file  . Highest education level: Not on file  Occupational History  . Not on file  Social Needs  . Financial resource strain: Not on file  . Food insecurity:    Worry: Not on file    Inability: Not on file  . Transportation needs:    Medical: Not on file    Non-medical: Not on file  Tobacco Use    . Smoking status: Never Smoker  . Smokeless tobacco: Never Used  Substance and Sexual Activity  . Alcohol use: No  . Drug use: No  . Sexual activity: Yes    Birth control/protection: None    Comment: tubal  Lifestyle  . Physical activity:    Days per week: Not on file    Minutes per session: Not on file  . Stress: Not on file  Relationships  . Social connections:    Talks on phone: Not on file    Gets together: Not on file    Attends religious service: Not on file    Active member of club or organization: Not on file    Attends meetings of clubs or organizations: Not on file    Relationship status: Not on file  . Intimate partner violence:    Fear of current or ex partner: Not on file    Emotionally abused: Not on file    Physically abused: Not on file    Forced sexual activity: Not on file  Other Topics Concern  . Not on file  Social History Narrative  . Not on file   Family History  Problem Relation Age of Onset  .  Hypertension Mother   . Anesthesia problems Neg Hx   . Hypotension Neg Hx   . Malignant hyperthermia Neg Hx   . Pseudochol deficiency Neg Hx   . Colon cancer Neg Hx   . Esophageal cancer Neg Hx   . Inflammatory bowel disease Neg Hx   . Liver disease Neg Hx   . Pancreatic cancer Neg Hx   . Rectal cancer Neg Hx   . Stomach cancer Neg Hx    I have reviewed her medical, social, and family history in detail and updated the electronic medical record as necessary.    PHYSICAL EXAMINATION  BP 128/82   Pulse 79   Ht 5' (1.524 m)   Wt 111 lb (50.3 kg)   SpO2 98%   BMI 21.68 kg/m  Wt Readings from Last 3 Encounters:  03/05/18 111 lb (50.3 kg)  02/10/18 113 lb 6.4 oz (51.4 kg)  12/15/17 115 lb (52.2 kg)  GEN: Patient already lying on table cradling her abdomen, 2 daughters with her, doesn't appear chronically ill or toxic however PSYCH: Cooperative, without pressured speech EYE: Conjunctivae pink, sclerae anicteric ENT: MMM, without oral ulcers, no  erythema or exudates noted NECK: Supple CV: RR without R/Gs  RESP: CTAB posteriorly, without wheezing GI: NABS, soft, TTP throughout her abdomen upon light and deep palpation, ventral hernia present in the midline, volitional guarding noted throughout abdomen, without rebound, HSM difficult to appreciate due to the patient's level of discomfort for abdominal examination GU: DRE shows evidence of internal prolapsed hemorrhoids as well as external hemorrhoids, normal perineal descent on exam MSK/EXT: No LE edema SKIN: No jaundice NEURO:  Alert & Oriented x 3, no focal deficits   REVIEW OF DATA  I reviewed the following data at the time of this encounter:  GI Procedures and Studies  No relevant studies to review  Laboratory Studies  Reviewed in epic  Imaging Studies  October 2019 CT abdomen pelvis with contrast IMPRESSION: 1. Mild steatosis of the liver. 2. Normal gallbladder without biliary dilatation or secondary signs of acute cholecystitis. 3. Mild diffuse transmural thickening of jejunum suspicious for small bowel enteritis. No mechanical bowel obstruction. 4. Normal appendix. 5. Stable cyst in the lower pole the right kidney measuring 1.1 cm.  June 2015 KUB IMPRESSION: Negative abdominal radiographs.  No acute cardiopulmonary disease.  March 2015 abdominal ultrasound IMPRESSION: Unremarkable abdominal ultrasound demonstrating no pathology.  March 2015 CT abdomen pelvis with contrast IMPRESSION: Anterior abdominal wall/ventral fat containing hernia and fat containing umbilical hernia. No acute intra-abdominal or pelvic pathology. Physiologic left ovarian cyst incidentally noted. This could correspond to the history of left lower quadrant abdominal pain.  December 2012 MR abdomen without contrast IMPRESSION: No umbilical or ventral hernia.   ASSESSMENT  Ms. Evern Bio is a 47 y.o. female with a pmh significant for Multiple Gestations, Abdominal Hernia,  Ovarian Cysts, hx of MDD/Anxiety, GERD, Chronic Abdominal Pain.  The patient is seen today for evaluation and management of:  1. Gastroesophageal reflux disease, esophagitis presence not specified   2. Chronic generalized abdominal pain   3. Rectal bleeding   4. Non-intractable vomiting with nausea, unspecified vomiting type   5. Abnormal CT of the abdomen   6. Hemorrhoids, unspecified hemorrhoid type    This is a hemodynamically stable patient has had chronic abdominal pain as well as other associated GI symptoms for years.  It is not clear to me the etiology of her underlying symptoms however she has had some slight abnormalities noted  on her last CT scan when she was evaluated in the emergency department.  Enteritis or changes can be nonspecific and be a result of multiple different processes including infectious etiologies however sometimes we never find an underlying infection.  With her symptoms from above and below she does require an endoscopic evaluation to rule out inflammatory bowel disease as well as other etiologies of discomfort.  Peptic ulcer disease and GERD/esophagitis.  She has had significant amounts of nausea with vomiting and the possibilities of a functional disorder are elevated and my thought process however we will need to see her laboratories to evaluate for any sort of abnormalities.  Solid-phase gastric emptying study may be something that needs to be considered down the road.  The patient asked whether hydrocodone would be reasonable even though she is having dizziness with this and I told her while her undergoing a work-up at this point in time I would not recommend that and I do not prescribe opioids for chronic pain syndromes and that she will have to discuss this and address this with her primary care physician or potentially with the pain specialist.  I am hopeful that we can help with some of her symptoms get an underlying diagnosis.  We will need to work with her  constipation as well  The risks and benefits of endoscopic evaluation were discussed with the patient; these include but are not limited to the risk of perforation, infection, bleeding, missed lesions, lack of diagnosis, severe illness requiring hospitalization, as well as anesthesia and sedation related illnesses.  The patient is agreeable to proceed.  All patient questions were answered, to the best of my ability, and the patient agrees to the aforementioned plan of action with follow-up as indicated.   PLAN  1. Chronic generalized abdominal pain - SBE/Colonoscopy (Esophageal/Gastric/Duodenal/Jejunal Biopsies & Ileal/Colon/Rectal Biopsies) - CT as noted below - CBC; Future - Basic metabolic panel; Future - Lipase; Future - Hepatic function panel; Future - TSH; Future - Cortisol; Future - High sensitivity CRP; Future - IBC panel; Future - Ferritin; Future - Tissue transglutaminase, IgA; Future - IgA; Future - Protime-INR; Future  2. Gastroesophageal reflux disease, esophagitis presence not specified - Start Omeprazole 40 mg BID - EGD to evaluate for esophagitis and EoE  3. Rectal bleeding - Colonoscopy - Fiber supplementation 1-2 times daily - Miralax once every other day to have soft stools - Start Anusol ointment daily for next 2-weeks - May use Anusol suppositories QHS every other day for next 2-weeks to decrease inflammation of hemorrhoids - CBC; Future - IBC panel; Future - Ferritin; Future  4. Non-intractable vomiting with nausea, unspecified vomiting type - Workup as above and below - Zofran PRN - Consider SF-GES based on workup findings - Lipase; Future - Hepatic function panel; Future - TSH; Future - Cortisol; Future  5. Abnormal CT of the abdomen - Plan to repeat with CT ENTERO ABD/PELVIS W CONTRAST; Future   Orders Placed This Encounter  Procedures  . CT ENTERO ABD/PELVIS W CONTAST  . CBC  . Basic metabolic panel  . Lipase  . Hepatic function panel  .  TSH  . Cortisol  . High sensitivity CRP  . IBC panel  . Ferritin  . Tissue transglutaminase, IgA  . IgA  . Protime-INR  . Ambulatory referral to Gastroenterology    New Prescriptions   HYDROCORTISONE (ANUSOL-HC) 2.5 % RECTAL CREAM    Place 1 application rectally 2 (two) times daily.   HYDROCORTISONE (ANUSOL-HC) 25 MG SUPPOSITORY  Place 1 suppository (25 mg total) rectally every 12 (twelve) hours.   OMEPRAZOLE (PRILOSEC) 40 MG CAPSULE    Take 1 capsule (40 mg total) by mouth 2 (two) times daily.   Modified Medications   No medications on file    Planned Follow Up: No follow-ups on file.   Justice Britain, MD [facility] Office # [phone]

## 2018-03-05 NOTE — Patient Instructions (Addendum)
You have been scheduled for a CT scan of the abdomen and pelvis at [facility] ([address]---this is in the same building as Press [facility]).   You are scheduled on 03/31/18 at 230pm. You should arrive 1 hour and 20  minutes prior to your appointment time for registration and drink contrast.. Please follow the written instructions below on the day of your exam:  WARNING: IF YOU ARE ALLERGIC TO IODINE/X-RAY DYE, PLEASE NOTIFY RADIOLOGY IMMEDIATELY AT [phone]! YOU WILL BE GIVEN A 13 HOUR PREMEDICATION PREP.  1) Do not eat or drink anything after 1030am (4 hours prior to your test)   You may take any medications as prescribed with a small amount of water, if necessary. If you take any of the following medications: METFORMIN, GLUCOPHAGE, GLUCOVANCE, AVANDAMET, RIOMET, FORTAMET, ACTOPLUS MET, JANUMET, GLUMETZA or METAGLIP, you MAY be asked to HOLD this medication 48 hours AFTER the exam.  This test typically takes 30-45 minutes to complete.  If you have any questions regarding your exam or if you need to reschedule, you may call the CT department at [phone] between the hours of 8:00 am and 5:00 pm, Monday-Friday.  Take fiber 1-2 times daily Miralax everyother day and increase to once daily as needed to have BM ___________________ Drink 5-6 bottles of water  We have sent the following medications to your pharmacy for you to pick up at your convenience:  Your provider has requested that you go to the basement level for lab work before leaving today. Press "B" on the elevator. The lab is located at the first door on the left as you exit the elevator. _____________________________________________________

## 2018-03-06 LAB — TISSUE TRANSGLUTAMINASE, IGA: (tTG) Ab, IgA: 1 U/mL

## 2018-03-06 LAB — FERRITIN: Ferritin: 6.2 ng/mL — ABNORMAL LOW (ref 10.0–291.0)

## 2018-03-07 ENCOUNTER — Encounter: Payer: Self-pay | Admitting: Gastroenterology

## 2018-03-09 ENCOUNTER — Telehealth: Payer: Self-pay

## 2018-03-09 DIAGNOSIS — R1084 Generalized abdominal pain: Secondary | ICD-10-CM

## 2018-03-09 DIAGNOSIS — K219 Gastro-esophageal reflux disease without esophagitis: Secondary | ICD-10-CM | POA: Insufficient documentation

## 2018-03-09 DIAGNOSIS — R1110 Vomiting, unspecified: Secondary | ICD-10-CM | POA: Insufficient documentation

## 2018-03-09 DIAGNOSIS — R935 Abnormal findings on diagnostic imaging of other abdominal regions, including retroperitoneum: Secondary | ICD-10-CM | POA: Insufficient documentation

## 2018-03-09 DIAGNOSIS — K625 Hemorrhage of anus and rectum: Secondary | ICD-10-CM | POA: Insufficient documentation

## 2018-03-09 DIAGNOSIS — G8929 Other chronic pain: Secondary | ICD-10-CM | POA: Insufficient documentation

## 2018-03-09 DIAGNOSIS — K649 Unspecified hemorrhoids: Secondary | ICD-10-CM | POA: Insufficient documentation

## 2018-03-09 NOTE — Telephone Encounter (Signed)
Spoke to patient and her daughter. Patient states she is taking Omeprazole 40mg  twice daily. Medication list has been updated.

## 2018-03-10 ENCOUNTER — Ambulatory Visit: Payer: Medicaid Other | Attending: Family Medicine | Admitting: Family Medicine

## 2018-03-10 ENCOUNTER — Encounter: Payer: Self-pay | Admitting: Family Medicine

## 2018-03-10 VITALS — BP 117/65 | HR 78 | Resp 16 | Ht 59.0 in | Wt 111.6 lb

## 2018-03-10 DIAGNOSIS — Z79899 Other long term (current) drug therapy: Secondary | ICD-10-CM | POA: Insufficient documentation

## 2018-03-10 DIAGNOSIS — R1084 Generalized abdominal pain: Secondary | ICD-10-CM | POA: Diagnosis not present

## 2018-03-10 DIAGNOSIS — Z885 Allergy status to narcotic agent status: Secondary | ICD-10-CM | POA: Insufficient documentation

## 2018-03-10 DIAGNOSIS — H547 Unspecified visual loss: Secondary | ICD-10-CM | POA: Diagnosis not present

## 2018-03-10 DIAGNOSIS — R1013 Epigastric pain: Secondary | ICD-10-CM | POA: Diagnosis not present

## 2018-03-10 DIAGNOSIS — K5909 Other constipation: Secondary | ICD-10-CM

## 2018-03-10 MED ORDER — DICYCLOMINE HCL 10 MG PO CAPS: 10.0000 mg | ORAL_CAPSULE | Freq: Three times a day (TID) | ORAL | 3 refills | Status: DC

## 2018-03-10 NOTE — Progress Notes (Signed)
Subjective:    Patient ID: Bonnie Conrad, female    DOB: [birth year], 47 y.o.   MRN: [id number]   Due to a language barrier, patient's daughter acted as interpreter at today's visit  HPI       47 year old female seen in follow-up of chronic abdominal pain, reflux symptoms, constipation and hemorrhoidal rectal bleeding.  Patient has seen gastroenterology since her last visit here.  Patient reports that she has follow-up appointment in January to have abdominal CT as well as EGD and colonoscopy.  Patient states that her upper abdominal pain is better and she has less reflux symptoms since her omeprazole was increased to 40 mg twice daily by GI.  Patient has not yet started use of MiraLAX for constipation.  Patient does feel as if she is a little constipated at today's visit.  Patient continues to have generalized abdominal pain which is dull and aching as if she has been punched in her abdomen.  Patient reports that the pain is about a 5 on a 0-to-10 scale and patient would like to have medication to help with her abdominal pain.  Patient reports that she has had no further rectal bleeding but has been using the suppositories as prescribed by GI.  Patient states however that yesterday after using the suppository, she felt as if her generalized abdominal pain was increased all day.  Past Medical History:  Diagnosis Date  . Abdominal hernia   . Depression    hosp during pregnancy at [facility] health  . Hernia of abdominal cavity 2009  . Ovarian cyst    Past Surgical History:  Procedure Laterality Date  . DILATION AND CURETTAGE OF UTERUS    . TUBAL LIGATION  07/25/2011   Procedure: POST PARTUM TUBAL LIGATION;  Surgeon: Mora Bellman, MD;  Location: [facility];  Service: Gynecology;  Laterality: Bilateral;   Family History  Problem Relation Age of Onset  . Hypertension Mother   . Anesthesia problems Neg Hx   . Hypotension Neg Hx   . Malignant hyperthermia Neg Hx   . Pseudochol deficiency  Neg Hx   . Colon cancer Neg Hx   . Esophageal cancer Neg Hx   . Inflammatory bowel disease Neg Hx   . Liver disease Neg Hx   . Pancreatic cancer Neg Hx   . Rectal cancer Neg Hx   . Stomach cancer Neg Hx    Social History   Tobacco Use  . Smoking status: Never Smoker  . Smokeless tobacco: Never Used  Substance Use Topics  . Alcohol use: No  . Drug use: No   Allergies  Allergen Reactions  . Vicodin [Hydrocodone-Acetaminophen] Hives and Itching   Current Outpatient Medications on File Prior to Visit  Medication Sig Dispense Refill  . hydrocortisone (ANUSOL-HC) 2.5 % rectal cream Place 1 application rectally 2 (two) times daily. 30 g 2  . hydrocortisone (ANUSOL-HC) 25 MG suppository Place 1 suppository (25 mg total) rectally every 12 (twelve) hours. 20 suppository 0  . omeprazole (PRILOSEC) 40 MG capsule Take 1 capsule (40 mg total) by mouth 2 (two) times daily. 60 capsule 3  . ondansetron (ZOFRAN ODT) 4 MG disintegrating tablet Take 1 tablet (4 mg total) by mouth every 8 (eight) hours as needed for nausea or vomiting. 20 tablet 0   No current facility-administered medications on file prior to visit.       Review of Systems  Constitutional: Positive for fatigue. Negative for chills and fever.  HENT: Negative for sore  throat and trouble swallowing.   Eyes: Positive for visual disturbance. Negative for photophobia.       Complaint of difficulty with seeing small print  Respiratory: Negative for cough and shortness of breath.   Cardiovascular: Negative for chest pain, palpitations and leg swelling.  Gastrointestinal: Positive for abdominal pain and constipation. Negative for anal bleeding, blood in stool, diarrhea, nausea, rectal pain and vomiting.  Genitourinary: Negative for dysuria, flank pain and frequency.  Neurological: Positive for headaches. Negative for dizziness.       Objective:   Physical Exam BP 117/65 (BP Location: Left Arm, Patient Position: Sitting, Cuff Size:  Normal)   Pulse 78   Resp 16   Ht 4\' 11"  (1.499 m)   Wt 111 lb 9.6 oz (50.6 kg)   LMP 03/06/2018 (Exact Date)   BMI 22.54 kg/m  Nurse's notes and vital signs reviewed General-well-nourished, well-developed but short statured female in no acute distress but patient appears to not feel well versus being fatigued.  Patient is accompanied by her daughter who has her infant son Lungs-clear to auscultation bilaterally Cardiovascular-regular rate and rhythm Abdomen- normal to slightly hypoactive bowel sounds, mild abdominal distention, patient with epigastric tenderness to palpation as well as generalized mid and lower abdominal discomfort to palpation, no rebound or guarding Back-no CVA tenderness Extremities-no edema       Assessment & Plan:  1. Epigastric pain Patient reports improvement in epigastric pain after omeprazole was increased to 40 mg twice daily by GI.  Patient is scheduled to have EGD and colonoscopy in January along with CT scan.  Patient's gastroenterology note from 03/05/2018 was reviewed.  2. Generalized abdominal pain Patient requests medication for her abdominal pain.  Prescription sent to patient's pharmacy for Bentyl 10 mg to take before meals and at bedtime to help with her abdominal pain.  Patient is also encouraged to take the MiraLAX that was prescribed.  Patient will follow-up with GI for EGD colonoscopy and CT scan of the abdomen. - dicyclomine (BENTYL) 10 MG capsule; Take 1 capsule (10 mg total) by mouth 4 (four) times daily -  before meals and at bedtime. To relieve abdominal pain  Dispense: 120 capsule; Refill: 3  3. Chronic constipation Patient encouraged to take MiraLAX daily as prescribed but if she develops loose stools/diarrhea than she may decrease MiraLAX use to once every other day or once every 3 days but she should take the medication in order to avoid constipation which causes her to strain and have rectal bleeding/increased hemorrhoidal issues.  4.   Decreased visual acuity Patient with complaint on review of systems of decreased visual acuity when reading small print and patient would like to have further evaluation.  Patient will be referred to optometry/ophthalmology.  Patient is also encouraged to obtain a list of eye care specialist at checkout to call to arrange a follow-up appointment.  An After Visit Summary was printed and given to the patient.  Return in about 4 months (around 07/09/2018) for abdominal pain and as needed.

## 2018-03-11 ENCOUNTER — Other Ambulatory Visit: Payer: Self-pay

## 2018-03-11 MED ORDER — FERROUS GLUCONATE 324 (38 FE) MG PO TABS: 324.0000 mg | ORAL_TABLET | Freq: Every day | ORAL | 3 refills | Status: DC

## 2018-03-31 ENCOUNTER — Ambulatory Visit (INDEPENDENT_AMBULATORY_CARE_PROVIDER_SITE_OTHER)
Admission: RE | Admit: 2018-03-31 | Discharge: 2018-03-31 | Disposition: A | Discharge status: Home / Self Care | Payer: Medicaid Other | Source: Ambulatory Visit | Attending: Gastroenterology | Admitting: Gastroenterology

## 2018-03-31 DIAGNOSIS — G8929 Other chronic pain: Secondary | ICD-10-CM

## 2018-03-31 DIAGNOSIS — K625 Hemorrhage of anus and rectum: Secondary | ICD-10-CM

## 2018-03-31 DIAGNOSIS — R112 Nausea with vomiting, unspecified: Secondary | ICD-10-CM

## 2018-03-31 DIAGNOSIS — R1084 Generalized abdominal pain: Secondary | ICD-10-CM | POA: Diagnosis not present

## 2018-03-31 DIAGNOSIS — R1110 Vomiting, unspecified: Secondary | ICD-10-CM | POA: Diagnosis not present

## 2018-03-31 DIAGNOSIS — K219 Gastro-esophageal reflux disease without esophagitis: Secondary | ICD-10-CM

## 2018-03-31 MED ORDER — IOPAMIDOL (ISOVUE-300) INJECTION 61%
100.0000 mL | Freq: Once | INTRAVENOUS | Status: AC | PRN
  Administered 2018-03-31: 100 mL via INTRAVENOUS

## 2018-04-21 ENCOUNTER — Ambulatory Visit (AMBULATORY_SURGERY_CENTER): Payer: Medicaid Other | Admitting: Gastroenterology

## 2018-04-21 ENCOUNTER — Encounter: Payer: Self-pay | Admitting: Gastroenterology

## 2018-04-21 ENCOUNTER — Other Ambulatory Visit: Payer: Self-pay

## 2018-04-21 VITALS — BP 130/82 | HR 70 | Temp 99.3°F | Resp 17 | Ht 59.0 in | Wt 111.0 lb

## 2018-04-21 DIAGNOSIS — K449 Diaphragmatic hernia without obstruction or gangrene: Secondary | ICD-10-CM | POA: Diagnosis not present

## 2018-04-21 DIAGNOSIS — K644 Residual hemorrhoidal skin tags: Secondary | ICD-10-CM

## 2018-04-21 DIAGNOSIS — K228 Other specified diseases of esophagus: Secondary | ICD-10-CM | POA: Diagnosis not present

## 2018-04-21 DIAGNOSIS — K635 Polyp of colon: Secondary | ICD-10-CM

## 2018-04-21 DIAGNOSIS — K210 Gastro-esophageal reflux disease with esophagitis: Secondary | ICD-10-CM | POA: Diagnosis not present

## 2018-04-21 DIAGNOSIS — R109 Unspecified abdominal pain: Secondary | ICD-10-CM | POA: Diagnosis not present

## 2018-04-21 DIAGNOSIS — R194 Change in bowel habit: Secondary | ICD-10-CM | POA: Diagnosis not present

## 2018-04-21 DIAGNOSIS — D123 Benign neoplasm of transverse colon: Secondary | ICD-10-CM

## 2018-04-21 DIAGNOSIS — F329 Major depressive disorder, single episode, unspecified: Secondary | ICD-10-CM | POA: Diagnosis not present

## 2018-04-21 DIAGNOSIS — K219 Gastro-esophageal reflux disease without esophagitis: Secondary | ICD-10-CM | POA: Diagnosis not present

## 2018-04-21 DIAGNOSIS — K649 Unspecified hemorrhoids: Secondary | ICD-10-CM

## 2018-04-21 DIAGNOSIS — K625 Hemorrhage of anus and rectum: Secondary | ICD-10-CM | POA: Diagnosis not present

## 2018-04-21 MED ORDER — SODIUM CHLORIDE 0.9 % IV SOLN: 500.0000 mL | Freq: Once | INTRAVENOUS | Status: DC

## 2018-04-21 NOTE — Op Note (Signed)
[place] Patient Name: Bonnie Conrad Procedure Date: 04/21/2018 1:38 PM MRN: [id number] Endoscopist: Justice Britain , MD Age: 48 Referring MD:  Date of Birth: 04/12/1971 Gender: Female Account #: [id number] Procedure:                Small Bowel Enteroscopy Indications:              Generalized abdominal pain, Dysphagia, Nausea with                            vomiting Medicines:                Monitored Anesthesia Care Procedure:                Pre-Anesthesia Assessment:                           - Prior to the procedure, a History and Physical                            was performed, and patient medications and                            allergies were reviewed. The patient's tolerance of                            previous anesthesia was also reviewed. The risks                            and benefits of the procedure and the sedation                            options and risks were discussed with the patient.                            All questions were answered, and informed consent                            was obtained. Prior Anticoagulants: The patient has                            taken previous NSAID medication. ASA Grade                            Assessment: II - A patient with mild systemic                            disease. After reviewing the risks and benefits,                            the patient was deemed in satisfactory condition to                            undergo the procedure.  After obtaining informed consent, the endoscope was                            passed under direct vision. Throughout the                            procedure, the patient's blood pressure, pulse, and                            oxygen saturations were monitored continuously. The                            Model PCF-H190DL [id number]) scope was introduced                            through the mouth, and advanced to the jejunum. The                             upper GI endoscopy was accomplished without                            difficulty. The patient tolerated the procedure. Scope In: Scope Out: Findings:                 No gross lesions were noted in the entire                            esophagus. Biopsies were taken with a cold forceps                            for histology from the proximal and middle                            esophagus for EoE. Biopsies were taken with a cold                            forceps for histology from the distal esophagus for                            EoE.                           A 3 cm hiatal hernia was found. The proximal extent                            of the gastric folds (end of tubular esophagus) was                            33 cm from the incisors. The hiatal narrowing was                            35 cm from the incisors. The Z-line was 32 cm from  the incisors.                           No gross lesions were noted in the entire examined                            stomach. Biopsies were taken with a cold forceps                            for histology and Helicobacter pylori testing from                            the antrum/incisura/greater curve/lesser curve.                           No gross lesions were noted in the duodenal bulb,                            in the first portion of the duodenum, in the second                            portion of the duodenum, in the third portion of                            the duodenum and in the fourth portion of the                            duodenum.                           Normal appearing mucosa was found in the proximal                            jejunum.                           Biopsies were taken with a cold forceps in the                            duodenal bulb, in the second portion of the                            duodenum, in the third portion of the duodenum, in                             the fourth portion of the duodenum and in the                            proximal jejunum for histology to rule out Celiac                            Disease and Enteropathy. Complications:            No immediate complications. Estimated Blood Loss:  Estimated blood loss was minimal. Impression:               - No gross lesions in esophagus. Biopsied for EoE.                           - 3 cm hiatal hernia.                           - No gross lesions in the stomach. Biopsied for HP.                           - No gross lesions in the duodenal bulb, in the                            first portion of the duodenum, in the second                            portion of the duodenum, in the third portion of                            the duodenum and in the fourth portion of the                            duodenum.                           - Normal mucosa was found in the proximal jejunum.                           - Biopsies were taken with a cold forceps for                            histology in the duodenal bulb, in the second                            portion of the duodenum, in the third portion of                            the duodenum, in the fourth portion of the duodenum                            and in the proximal jejunum for Enteropathy/Celiac                            rule out. Recommendation:           - The patient will be observed post-procedure,                            until all discharge criteria are met.                           - Discharge patient to home.                           -  Patient has a contact number available for                            emergencies. The signs and symptoms of potential                            delayed complications were discussed with the                            patient. Return to normal activities tomorrow.                            Written discharge instructions were provided to the                             patient.                           - Proceed to scheduled colonoscopy.                           - Await pathology results.                           - Observe patient's clinical course.                           - Continue current PPI dosing.                           - The findings and recommendations were discussed                            with the patient.                           - The findings and recommendations were discussed                            with the patient's family. Justice Britain, MD 04/21/2018 2:48:59 PM

## 2018-04-21 NOTE — Progress Notes (Signed)
PT taken to PACU. Monitors in place. VSS. Report given to RN. 

## 2018-04-21 NOTE — Progress Notes (Signed)
I saw the patient on multiple occasions after her procedure.  Initially she was delirious which was likely a result of her waking up from anesthesia.  She was complaining of the same type of abdominal discomfort that she has had for years.  She is concerned about her hernia being a problem currently.  After speaking with her and talking her down she was a little bit more comfortable.  Her abdominal exam showed normal active bowel sounds and was soft with tenderness to palpation in the midepigastrium.  This is not significantly different from her prior although her level/score of pain felt to be worse.  We asked the patient to remain on her side and to try and help pass gas.  I relayed that if her pain persisted or she was at a point where things were very different we would send her eventually to the emergency department for further evaluation to rule out any complication from today's procedure.  I began my next case.  After my case was done I went to evaluate the patient and she was already getting dressed.  I then spoke with her and her friend through our interpreter to see how she was doing and she has improved significantly and felt ready to be able to be discharged.  I related that if things were to change or progress she should go to the ED for further evaluation but I thought things were most likely stable for discharge.  We went over the results of her upper and lower endoscopy and talk with her about findings and the plan for biopsies to be followed up in the coming week.  She felt comfortable with discharge and plans for return if issues.

## 2018-04-21 NOTE — Progress Notes (Signed)
Called to room to assist during endoscopic procedure.  Patient ID and intended procedure confirmed with present staff. Received instructions for my participation in the procedure from the performing physician.  

## 2018-04-21 NOTE — Patient Instructions (Signed)
YOU HAD AN ENDOSCOPIC PROCEDURE TODAY AT THE [facility]:   Refer to the procedure report that was given to you for any specific questions about what was found during the examination.  If the procedure report does not answer your questions, please call your gastroenterologist to clarify.  If you requested that your care partner not be given the details of your procedure findings, then the procedure report has been included in a sealed envelope for you to review at your convenience later.  YOU SHOULD EXPECT: Some feelings of bloating in the abdomen. Passage of more gas than usual.  Walking can help get rid of the air that was put into your GI tract during the procedure and reduce the bloating. If you had a lower endoscopy (such as a colonoscopy or flexible sigmoidoscopy) you may notice spotting of blood in your stool or on the toilet paper. If you underwent a bowel prep for your procedure, you may not have a normal bowel movement for a few days.  Please Note:  You might notice some irritation and congestion in your nose or some drainage.  This is from the oxygen used during your procedure.  There is no need for concern and it should clear up in a day or so.  SYMPTOMS TO REPORT IMMEDIATELY:   Following lower endoscopy (colonoscopy or flexible sigmoidoscopy):  Excessive amounts of blood in the stool  Significant tenderness or worsening of abdominal pains  Swelling of the abdomen that is new, acute  Fever of 100F or higher   Following upper endoscopy (EGD)  Vomiting of blood or coffee ground material  New chest pain or pain under the shoulder blades  Painful or persistently difficult swallowing  New shortness of breath  Fever of 100F or higher  Black, tarry-looking stools  For urgent or emergent issues, a gastroenterologist can be reached at any hour by calling [phone].   DIET:  We do recommend a small meal at first, but then you may proceed to your regular diet.  Drink  plenty of fluids but you should avoid alcoholic beverages for 24 hours.  ACTIVITY:  You should plan to take it easy for the rest of today and you should NOT DRIVE or use heavy machinery until tomorrow (because of the sedation medicines used during the test).    FOLLOW UP: Our staff will call the number listed on your records the next business day following your procedure to check on you and address any questions or concerns that you may have regarding the information given to you following your procedure. If we do not reach you, we will leave a message.  However, if you are feeling well and you are not experiencing any problems, there is no need to return our call.  We will assume that you have returned to your regular daily activities without incident.  If any biopsies were taken you will be contacted by phone or by letter within the next 1-3 weeks.  Please call us at [phone] if you have not heard about the biopsies in 3 weeks.    SIGNATURES/CONFIDENTIALITY: You and/or your care partner have signed paperwork which will be entered into your electronic medical record.  These signatures attest to the fact that that the information above on your After Visit Summary has been reviewed and is understood.  Full responsibility of the confidentiality of this discharge information lies with you and/or your care-partner.  Read all handouts given to you by your recovery room nurse. 

## 2018-04-21 NOTE — Progress Notes (Addendum)
[facility] - present for intrepretation  Dr Rush Landmark aware of Iron intake 3 days ago.

## 2018-04-21 NOTE — Op Note (Signed)
[facility] Patient Name: Bonnie Conrad Procedure Date: 04/21/2018 1:38 PM MRN: [id number] Endoscopist: Justice Britain , MD Age: 48 Referring MD:  Date of Birth: November 14, 1970 Gender: Female Account #: [id number] Procedure:                Colonoscopy Indications:              Generalized abdominal pain, Rectal bleeding, Change                            in bowel habits Medicines:                Monitored Anesthesia Care Procedure:                Pre-Anesthesia Assessment:                           - Prior to the procedure, a History and Physical                            was performed, and patient medications and                            allergies were reviewed. The patient's tolerance of                            previous anesthesia was also reviewed. The risks                            and benefits of the procedure and the sedation                            options and risks were discussed with the patient.                            All questions were answered, and informed consent                            was obtained. Prior Anticoagulants: The patient has                            taken previous NSAID medication. ASA Grade                            Assessment: II - A patient with mild systemic                            disease. After reviewing the risks and benefits,                            the patient was deemed in satisfactory condition to                            undergo the procedure.  After obtaining informed consent, the colonoscope                            was passed under direct vision. Throughout the                            procedure, the patient's blood pressure, pulse, and                            oxygen saturations were monitored continuously. The                            Model PCF-H190DL [id number]) scope was introduced                            through the anus and advanced to the 10 cm into the                           ileum. The colonoscopy was performed without                            difficulty. The patient tolerated the procedure.                            The quality of the bowel preparation was evaluated                            using the BBPS [facility] Bowel Preparation Scale)                            with scores of: Right Colon = 3, Transverse Colon =                            3 and Left Colon = 3 (entire mucosa seen well with                            no residual staining, small fragments of stool or                            opaque liquid). The total BBPS score equals 9. Scope In: 2:15:04 PM Scope Out: 2:35:22 PM Scope Withdrawal Time: 0 hours 16 minutes 33 seconds  Total Procedure Duration: 0 hours 20 minutes 18 seconds  Findings:                 The digital rectal exam findings include                            non-thrombosed external hemorrhoids and                            non-thrombosed internal hemorrhoids. Pertinent                            negatives include no  palpable rectal lesions.                           Skin tags were found on perianal exam.                           The terminal ileum and ileocecal valve appeared                            normal. Biopsies were taken with a cold forceps for                            histology to rule out IBD/Crohn's/Enteropathy.                           Two sessile polyps were found in the transverse                            colon and hepatic flexure. The polyps were 3 to 5                            mm in size. These polyps were removed with a cold                            snare. Resection and retrieval were complete.                           Normal mucosa was found in the entire colon.                            Biopsies were taken with a cold forceps for                            histology for rule out Microscopic/Collagenous                            Colitis & IBD.                            Normal mucosa was found in the rectum. Biopsies                            were taken with a cold forceps for histology to                            rule out Proctitis.                           Non-bleeding non-thrombosed external and internal                            hemorrhoids were found during retroflexion, during  perianal exam and during digital exam. The                            hemorrhoids were Grade III (internal hemorrhoids                            that prolapse but require manual reduction). Complications:            No immediate complications. Estimated Blood Loss:     Estimated blood loss was minimal. Impression:               - Non-thrombosed external hemorrhoids and                            non-thrombosed internal hemorrhoids found on                            digital rectal exam. Perianal skin tags found on                            perianal exam.                           - The examined portion of the ileum was normal.                            Biopsied.                           - Two 3 to 5 mm polyps in the transverse colon and                            at the hepatic flexure, removed with a cold snare.                            Resected and retrieved.                           - Normal mucosa in the entire examined colon.                            Biopsied.                           - Normal mucosa in the rectum. Biopsied.                           - Non-bleeding non-thrombosed external and internal                            hemorrhoids. Recommendation:           - The patient will be observed post-procedure,                            until all discharge criteria are met.                           -  Discharge patient to home.                           - Patient has a contact number available for                            emergencies. The signs and symptoms of potential                            delayed complications were  discussed with the                            patient. Return to normal activities tomorrow.                            Written discharge instructions were provided to the                            patient.                           - High fiber diet.                           - Continue present medications.                           - Use fiber, for example Citrucel, Fibercon, Konsyl                            or Metamucil.                           - Await pathology results.                           - Repeat colonoscopy in 5-10 years for surveillance                            based on pathology results based on findings of                            adenomatous tissue.                           - Return to GI clinic as previously scheduled.                           - The findings and recommendations were discussed                            with the patient.                           - The findings and recommendations were discussed  with the designated responsible adult. Justice Britain, MD 04/21/2018 2:55:32 PM

## 2018-04-21 NOTE — Progress Notes (Signed)
Patient came to recovery writhing and hysterical.  I explained where she was.  She kept trying to get out of the bed.  Attempted to tell the patient to turn on her l side. Patient does not listen.  Patient asked to get up on all fours.  Patient tried to get out of bed again, and refused to get up on all fours.  Dr. Is in to examine, and she is wanting pain medicine through her IV.  Dr. Is explaining that we do not have the medicine that she wants.  Dr. To evaluate further.  Dr. Has examined the patient. Patient specifically asked for morphine.  Dr. Geraldine Solar to evaluate by walking the patient, and turning from side to side and up on all fours.  Patient seems better. Finally gave me a pain score of 6. Attempted to get the patient up to walk, but she swayed from side to side. Remains in the bed.  Will notify Dr. Jerilynn Mages when he gets out of his next procedure.  VSS   Assistants helped to get the patient dressed. Dr. Aletha Halim come to speak with her after she is dressed.  Dr. Jerilynn Mages came in to speak with the patient and her carepartner.  Patient seems to understand.  Patient discharged home at this time per Dr. Carmin Muskrat.

## 2018-04-22 ENCOUNTER — Telehealth: Payer: Self-pay

## 2018-04-22 NOTE — Telephone Encounter (Signed)
First follow up procedure call, no answer

## 2018-04-22 NOTE — Telephone Encounter (Signed)
  Follow up Call-  Call back number 04/21/2018  Post procedure Call Back phone  # [phone]  Permission to leave phone message Yes  Some recent data might be hidden     Patient questions:  Do you have a fever, pain , or abdominal swelling? No. Pain Score  0 *  Have you tolerated food without any problems? Yes.    Have you been able to return to your normal activities? Yes.    Do you have any questions about your discharge instructions: Diet   No. Medications  No. Follow up visit  No.  Do you have questions or concerns about your Care? No.  Actions: * If pain score is 4 or above: No action needed, pain <4.

## 2018-04-30 ENCOUNTER — Encounter: Payer: Self-pay | Admitting: Gastroenterology

## 2018-05-15 ENCOUNTER — Ambulatory Visit: Payer: Self-pay | Admitting: Surgery

## 2018-05-15 DIAGNOSIS — K449 Diaphragmatic hernia without obstruction or gangrene: Secondary | ICD-10-CM | POA: Diagnosis not present

## 2018-05-15 DIAGNOSIS — K436 Other and unspecified ventral hernia with obstruction, without gangrene: Secondary | ICD-10-CM | POA: Diagnosis not present

## 2018-05-15 NOTE — H&P (View-Only) (Signed)
Surgical H&P  CC: abdominal pain  HPI: This is a pleasant 48 year old woman who is referred by Dr. Rush Landmark for endoscopic finding of a 3 cm hiatal hernia. She has had pain for about 10 years, getting worse over time, aggravated by movement and by sleeping on her side. The pain is in the mid abdomen just above the umbilicus. She does note occasional nausea but no emesis, has occasional reflux. The patient has undergone extensive workup for this including CT scan in September, CT enterography in December, upper and lower endoscopy earlier this month which did demonstrate a 3 cm hiatal hernia but really no other findings to explain her pain. While there are no gallstones noted on her CT abdomen, she has not had a right upper quadrant ultrasound or HIDA scan at this point. She denies tobacco use, or any other drug or alcohol use. She is otherwise healthy. No prior abdominal surgeries.  Allergies  Allergen Reactions  . Vicodin [Hydrocodone-Acetaminophen] Hives and Itching    Past Medical History:  Diagnosis Date  . Abdominal hernia   . Anemia   . Anxiety   . Arthritis   . Blood transfusion without reported diagnosis   . Depression    hosp during pregnancy at [facility]  . Hernia of abdominal cavity 2009  . Ovarian cyst     Past Surgical History:  Procedure Laterality Date  . DILATION AND CURETTAGE OF UTERUS    . TUBAL LIGATION  07/25/2011   Procedure: POST PARTUM TUBAL LIGATION;  Surgeon: Mora Bellman, MD;  Location: [facility];  Service: Gynecology;  Laterality: Bilateral;    Family History  Problem Relation Age of Onset  . Hypertension Mother   . Anesthesia problems Neg Hx   . Hypotension Neg Hx   . Malignant hyperthermia Neg Hx   . Pseudochol deficiency Neg Hx   . Colon cancer Neg Hx   . Esophageal cancer Neg Hx   . Inflammatory bowel disease Neg Hx   . Liver disease Neg Hx   . Pancreatic cancer Neg Hx   . Rectal cancer Neg Hx   . Stomach cancer Neg Hx   . Colon  polyps Neg Hx     Social History   Socioeconomic History  . Marital status: Married    Spouse name: Not on file  . Number of children: Not on file  . Years of education: Not on file  . Highest education level: Not on file  Occupational History  . Not on file  Social Needs  . Financial resource strain: Not on file  . Food insecurity:    Worry: Not on file    Inability: Not on file  . Transportation needs:    Medical: Not on file    Non-medical: Not on file  Tobacco Use  . Smoking status: Former Research scientist (life sciences)  . Smokeless tobacco: Never Used  Substance and Sexual Activity  . Alcohol use: No  . Drug use: No  . Sexual activity: Yes    Birth control/protection: None    Comment: tubal  Lifestyle  . Physical activity:    Days per week: Not on file    Minutes per session: Not on file  . Stress: Not on file  Relationships  . Social connections:    Talks on phone: Not on file    Gets together: Not on file    Attends religious service: Not on file    Active member of club or organization: Not on file    Attends  meetings of clubs or organizations: Not on file    Relationship status: Not on file  Other Topics Concern  . Not on file  Social History Narrative  . Not on file    Current Outpatient Medications on File Prior to Visit  Medication Sig Dispense Refill  . dicyclomine (BENTYL) 10 MG capsule Take 1 capsule (10 mg total) by mouth 4 (four) times daily -  before meals and at bedtime. To relieve abdominal pain 120 capsule 3  . ferrous gluconate (FERGON) 324 MG tablet Take 1 tablet (324 mg total) by mouth daily with breakfast. 30 tablet 3  . hydrocortisone (ANUSOL-HC) 2.5 % rectal cream Place 1 application rectally 2 (two) times daily. 30 g 2  . omeprazole (PRILOSEC) 40 MG capsule Take 1 capsule (40 mg total) by mouth 2 (two) times daily. 60 capsule 3  . ondansetron (ZOFRAN ODT) 4 MG disintegrating tablet Take 1 tablet (4 mg total) by mouth every 8 (eight) hours as needed for  nausea or vomiting. 20 tablet 0   No current facility-administered medications on file prior to visit.     Review of Systems: a complete, 10pt review of systems was completed with pertinent positives and negatives as documented in the HPI  Physical Exam: There were no vitals filed for this visit. Gen: alert and well appearing but fatigued Eye: extraocular motion intact, no scleral icterus ENT: moist mucus membranes, dentition intact Neck: no mass or thyromegaly Chest: unlabored respirations, symmetrical air entry, clear bilaterally CV: regular rate and rhythm, no pedal edema Abdomen: soft, nondistended. Focally tender in the mid and upper abdominal midline about 4 cm cephalad to the umbilicus where there is a palpable incarcerated epigastric hernia which is also notable on her prior CT scans. MSK: strength symmetrical throughout, no deformity Neuro: grossly intact, normal gait Psych: normal mood and affect, appropriate insight Skin: warm and dry, no rash or lesion on limited exam   CBC Latest Ref Rng & Units 03/05/2018 02/10/2018 12/15/2017  WBC 4.0 - 10.5 K/uL 8.0 7.4 8.3  Hemoglobin 12.0 - 15.0 g/dL 13.3 12.6 13.1  Hematocrit 36.0 - 46.0 % 40.0 37.5 38.5  Platelets 150.0 - 400.0 K/uL 251.0 281 276    CMP Latest Ref Rng & Units 03/05/2018 12/15/2017 05/01/2014  Glucose 70 - 99 mg/dL 86 94 107(H)  BUN 6 - 23 mg/dL 11 16 7   Creatinine 0.40 - 1.20 mg/dL 0.68 0.61 0.70  Sodium 135 - 145 mEq/L 140 143 142  Potassium 3.5 - 5.1 mEq/L 3.4(L) 3.8 3.4(L)  Chloride 96 - 112 mEq/L 106 110 103  CO2 19 - 32 mEq/L 25 22 -  Calcium 8.4 - 10.5 mg/dL 9.4 9.3 -  Total Protein 6.0 - 8.3 g/dL 7.7 7.1 -  Total Bilirubin 0.2 - 1.2 mg/dL 0.8 0.6 -  Alkaline Phos 39 - 117 U/L 54 59 -  AST 0 - 37 U/L 13 18 -  ALT 0 - 35 U/L 13 15 -    Lab Results  Component Value Date   INR 1.1 (H) 03/05/2018    Imaging: No results found.   A/P: INCARCERATED EPIGASTRIC HERNIA (K43.6) Story: Based on her  description of the pain, location, quality and her physical exam I suspect that this is the source of much of her pain and would recommend going ahead and repairing this. Discussed option of open versus laparoscopic, possible use of mesh, risks of bleeding, infection, pain, scarring, injury to intra-abdominal structures, and failure to resolve all of her  symptoms. At this juncture I would recommend an open approach. Questions were welcomed and answered. She would like to proceed with surgery.  Conversation and consent were performed with the use of a medical interpreter.   HIATAL HERNIA (K44.9) Story: Noted on upper endoscopy, but not appreciable really on recent CT scans. She does not have any dysphagia, chest pain, and has only occasional reflux. While this may become increasing symptomatic with time and may ultimately require repair, I do not think that this is the main cause of her complaints at this time. Would continue PPI and antireflux measures.   Romana Juniper, MD [facility], [place] Pager [phone]

## 2018-05-15 NOTE — H&P (Signed)
Surgical H&P  CC: abdominal pain  HPI: This is a pleasant 48 year old woman who is referred by Dr. Rush Landmark for endoscopic finding of a 3 cm hiatal hernia. She has had pain for about 10 years, getting worse over time, aggravated by movement and by sleeping on her side. The pain is in the mid abdomen just above the umbilicus. She does note occasional nausea but no emesis, has occasional reflux. The patient has undergone extensive workup for this including CT scan in September, CT enterography in December, upper and lower endoscopy earlier this month which did demonstrate a 3 cm hiatal hernia but really no other findings to explain her pain. While there are no gallstones noted on her CT abdomen, she has not had a right upper quadrant ultrasound or HIDA scan at this point. She denies tobacco use, or any other drug or alcohol use. She is otherwise healthy. No prior abdominal surgeries.  Allergies  Allergen Reactions  . Vicodin [Hydrocodone-Acetaminophen] Hives and Itching    Past Medical History:  Diagnosis Date  . Abdominal hernia   . Anemia   . Anxiety   . Arthritis   . Blood transfusion without reported diagnosis   . Depression    hosp during pregnancy at [facility]  . Hernia of abdominal cavity 2009  . Ovarian cyst     Past Surgical History:  Procedure Laterality Date  . DILATION AND CURETTAGE OF UTERUS    . TUBAL LIGATION  07/25/2011   Procedure: POST PARTUM TUBAL LIGATION;  Surgeon: Mora Bellman, MD;  Location: [facility];  Service: Gynecology;  Laterality: Bilateral;    Family History  Problem Relation Age of Onset  . Hypertension Mother   . Anesthesia problems Neg Hx   . Hypotension Neg Hx   . Malignant hyperthermia Neg Hx   . Pseudochol deficiency Neg Hx   . Colon cancer Neg Hx   . Esophageal cancer Neg Hx   . Inflammatory bowel disease Neg Hx   . Liver disease Neg Hx   . Pancreatic cancer Neg Hx   . Rectal cancer Neg Hx   . Stomach cancer Neg Hx   . Colon  polyps Neg Hx     Social History   Socioeconomic History  . Marital status: Married    Spouse name: Not on file  . Number of children: Not on file  . Years of education: Not on file  . Highest education level: Not on file  Occupational History  . Not on file  Social Needs  . Financial resource strain: Not on file  . Food insecurity:    Worry: Not on file    Inability: Not on file  . Transportation needs:    Medical: Not on file    Non-medical: Not on file  Tobacco Use  . Smoking status: Former Research scientist (life sciences)  . Smokeless tobacco: Never Used  Substance and Sexual Activity  . Alcohol use: No  . Drug use: No  . Sexual activity: Yes    Birth control/protection: None    Comment: tubal  Lifestyle  . Physical activity:    Days per week: Not on file    Minutes per session: Not on file  . Stress: Not on file  Relationships  . Social connections:    Talks on phone: Not on file    Gets together: Not on file    Attends religious service: Not on file    Active member of club or organization: Not on file    Attends  meetings of clubs or organizations: Not on file    Relationship status: Not on file  Other Topics Concern  . Not on file  Social History Narrative  . Not on file    Current Outpatient Medications on File Prior to Visit  Medication Sig Dispense Refill  . dicyclomine (BENTYL) 10 MG capsule Take 1 capsule (10 mg total) by mouth 4 (four) times daily -  before meals and at bedtime. To relieve abdominal pain 120 capsule 3  . ferrous gluconate (FERGON) 324 MG tablet Take 1 tablet (324 mg total) by mouth daily with breakfast. 30 tablet 3  . hydrocortisone (ANUSOL-HC) 2.5 % rectal cream Place 1 application rectally 2 (two) times daily. 30 g 2  . omeprazole (PRILOSEC) 40 MG capsule Take 1 capsule (40 mg total) by mouth 2 (two) times daily. 60 capsule 3  . ondansetron (ZOFRAN ODT) 4 MG disintegrating tablet Take 1 tablet (4 mg total) by mouth every 8 (eight) hours as needed for  nausea or vomiting. 20 tablet 0   No current facility-administered medications on file prior to visit.     Review of Systems: a complete, 10pt review of systems was completed with pertinent positives and negatives as documented in the HPI  Physical Exam: There were no vitals filed for this visit. Gen: alert and well appearing but fatigued Eye: extraocular motion intact, no scleral icterus ENT: moist mucus membranes, dentition intact Neck: no mass or thyromegaly Chest: unlabored respirations, symmetrical air entry, clear bilaterally CV: regular rate and rhythm, no pedal edema Abdomen: soft, nondistended. Focally tender in the mid and upper abdominal midline about 4 cm cephalad to the umbilicus where there is a palpable incarcerated epigastric hernia which is also notable on her prior CT scans. MSK: strength symmetrical throughout, no deformity Neuro: grossly intact, normal gait Psych: normal mood and affect, appropriate insight Skin: warm and dry, no rash or lesion on limited exam   CBC Latest Ref Rng & Units 03/05/2018 02/10/2018 12/15/2017  WBC 4.0 - 10.5 K/uL 8.0 7.4 8.3  Hemoglobin 12.0 - 15.0 g/dL 13.3 12.6 13.1  Hematocrit 36.0 - 46.0 % 40.0 37.5 38.5  Platelets 150.0 - 400.0 K/uL 251.0 281 276    CMP Latest Ref Rng & Units 03/05/2018 12/15/2017 05/01/2014  Glucose 70 - 99 mg/dL 86 94 107(H)  BUN 6 - 23 mg/dL 11 16 7   Creatinine 0.40 - 1.20 mg/dL 0.68 0.61 0.70  Sodium 135 - 145 mEq/L 140 143 142  Potassium 3.5 - 5.1 mEq/L 3.4(L) 3.8 3.4(L)  Chloride 96 - 112 mEq/L 106 110 103  CO2 19 - 32 mEq/L 25 22 -  Calcium 8.4 - 10.5 mg/dL 9.4 9.3 -  Total Protein 6.0 - 8.3 g/dL 7.7 7.1 -  Total Bilirubin 0.2 - 1.2 mg/dL 0.8 0.6 -  Alkaline Phos 39 - 117 U/L 54 59 -  AST 0 - 37 U/L 13 18 -  ALT 0 - 35 U/L 13 15 -    Lab Results  Component Value Date   INR 1.1 (H) 03/05/2018    Imaging: No results found.   A/P: INCARCERATED EPIGASTRIC HERNIA (K43.6) Story: Based on her  description of the pain, location, quality and her physical exam I suspect that this is the source of much of her pain and would recommend going ahead and repairing this. Discussed option of open versus laparoscopic, possible use of mesh, risks of bleeding, infection, pain, scarring, injury to intra-abdominal structures, and failure to resolve all of her  symptoms. At this juncture I would recommend an open approach. Questions were welcomed and answered. She would like to proceed with surgery.  Conversation and consent were performed with the use of a medical interpreter.   HIATAL HERNIA (K44.9) Story: Noted on upper endoscopy, but not appreciable really on recent CT scans. She does not have any dysphagia, chest pain, and has only occasional reflux. While this may become increasing symptomatic with time and may ultimately require repair, I do not think that this is the main cause of her complaints at this time. Would continue PPI and antireflux measures.   Romana Juniper, MD [facility], [place] Pager [phone]

## 2018-05-28 ENCOUNTER — Encounter: Payer: Self-pay | Admitting: Gastroenterology

## 2018-05-28 ENCOUNTER — Other Ambulatory Visit (INDEPENDENT_AMBULATORY_CARE_PROVIDER_SITE_OTHER): Payer: Medicaid Other

## 2018-05-28 ENCOUNTER — Ambulatory Visit: Payer: Medicaid Other | Admitting: Gastroenterology

## 2018-05-28 VITALS — BP 120/80 | HR 84 | Ht 59.0 in | Wt 109.0 lb

## 2018-05-28 DIAGNOSIS — D509 Iron deficiency anemia, unspecified: Secondary | ICD-10-CM | POA: Diagnosis not present

## 2018-05-28 DIAGNOSIS — R1310 Dysphagia, unspecified: Secondary | ICD-10-CM

## 2018-05-28 DIAGNOSIS — G8929 Other chronic pain: Secondary | ICD-10-CM | POA: Diagnosis not present

## 2018-05-28 DIAGNOSIS — R1084 Generalized abdominal pain: Secondary | ICD-10-CM | POA: Diagnosis not present

## 2018-05-28 DIAGNOSIS — K439 Ventral hernia without obstruction or gangrene: Secondary | ICD-10-CM

## 2018-05-28 DIAGNOSIS — K219 Gastro-esophageal reflux disease without esophagitis: Secondary | ICD-10-CM

## 2018-05-28 DIAGNOSIS — Z86010 Personal history of colonic polyps: Secondary | ICD-10-CM

## 2018-05-28 LAB — CBC
HCT: 41.1 % (ref 36.0–46.0)
Hemoglobin: 13.7 g/dL (ref 12.0–15.0)
MCHC: 33.3 g/dL (ref 30.0–36.0)
MCV: 91.8 fl (ref 78.0–100.0)
Platelets: 245 10*3/uL (ref 150.0–400.0)
RBC: 4.48 Mil/uL (ref 3.87–5.11)
RDW: 14.8 % (ref 11.5–15.5)
WBC: 7.8 10*3/uL (ref 4.0–10.5)

## 2018-05-28 LAB — FERRITIN: Ferritin: 9.9 ng/mL — ABNORMAL LOW (ref 10.0–291.0)

## 2018-05-28 LAB — IBC PANEL
Iron: 146 ug/dL — ABNORMAL HIGH (ref 42–145)
Saturation Ratios: 31.6 % (ref 20.0–50.0)
TRANSFERRIN: 330 mg/dL (ref 212.0–360.0)

## 2018-05-28 MED ORDER — FERROUS GLUCONATE 324 (38 FE) MG PO TABS: 324.0000 mg | ORAL_TABLET | Freq: Every day | ORAL | 2 refills | Status: DC

## 2018-05-28 MED ORDER — OMEPRAZOLE 40 MG PO CPDR: 40.0000 mg | DELAYED_RELEASE_CAPSULE | Freq: Two times a day (BID) | ORAL | 2 refills | Status: DC

## 2018-05-28 NOTE — Progress Notes (Signed)
[place] VISIT   Primary Care Provider Antony Blackbird, MD [place] [address] [phone]  Patient Profile: Bonnie Conrad is a 48 y.o. female with a pmh significant for Multiple Gestations, Abdominal Hernia, Ovarian Cysts, hx of MDD/Anxiety, GERD, colon polyps (SSPs), ventral diastases/hernia, chronic Abdominal Pain.  The patient presents to the [facility] for an evaluation and management of problem(s) noted below:  Problem List 1. Iron deficiency anemia, unspecified iron deficiency anemia type   2. Chronic generalized abdominal pain   3. Ventral hernia without obstruction or gangrene   4. Gastroesophageal reflux disease, esophagitis presence not specified   5. History of colonic polyps   6. Dysphagia, unspecified type     History of Present Illness: Please see initial consultation note for full details of HPI.  Interval History  The patient returns for scheduled follow-up.  In January we performed an enteroscopy and colonoscopy.  Based on histologic findings it seems like the patient does have persistent GERD although does not have overt esophagitis.  No evidence of other significant pathology in the upper GI tract.  In the lower GI tract her biopsies were negative but she did have evidence of SSP's and will require a 5-year colonoscopy follow-up.  The patient has subsequently already seen surgery and has an upcoming hernia repair later this month.  Patient states that she still having issues but is slightly better than she was before.  She feels V acid reducing medicine has helped but she still has GERD and infrequently has dysphagia to solid foods.  No liquid dysphagia.  No odynophagia.  Abdominal pain in the region of her hernia still remains and occurs at any time point in the day and is not always related to food intake.  She not had any significant rectal bleeding recently.    GI Review of  Systems Positive as above including bloating, decreased appetite Negative for melena, vomiting  Review of Systems General: Denies fevers/chills HEENT: Denies oral lesions Cardiovascular: Denies current chest pain/palpitations Pulmonary: Denies shortness of breath Gastroenterological: See HPI Genitourinary: Denies hematuria  Hematological: Denies easy bruising Dermatological: Denies jaundice Psychological: Mood is anxious   Medications Current Outpatient Medications  Medication Sig Dispense Refill  . dicyclomine (BENTYL) 10 MG capsule Take 1 capsule (10 mg total) by mouth 4 (four) times daily -  before meals and at bedtime. To relieve abdominal pain 120 capsule 3  . omeprazole (PRILOSEC) 40 MG capsule Take 1 capsule (40 mg total) by mouth 2 (two) times daily. 60 capsule 2  . ondansetron (ZOFRAN ODT) 4 MG disintegrating tablet Take 1 tablet (4 mg total) by mouth every 8 (eight) hours as needed for nausea or vomiting. 20 tablet 0  . ferrous gluconate (FERGON) 324 MG tablet Take 1 tablet (324 mg total) by mouth daily with breakfast for 30 days. 30 tablet 2   No current facility-administered medications for this visit.     Allergies Allergies  Allergen Reactions  . Penicillins Swelling    Did it involve swelling of the face/tongue/throat, SOB, or low BP? Yes Did it involve sudden or severe rash/hives, skin peeling, or any reaction on the inside of your mouth or nose? No Did you need to seek medical attention at a hospital or doctor's office? No When did it last happen?1 or 2 years ago If all above answers are "NO", may proceed with cephalosporin use.   . Vicodin [Hydrocodone-Acetaminophen] Hives and Itching    Histories Past Medical  History:  Diagnosis Date  . Abdominal hernia   . Anemia   . Anxiety   . Arthritis   . Blood transfusion without reported diagnosis   . Depression    hosp during pregnancy at [facility]  . Hernia of abdominal cavity 2009  . Ovarian  cyst    Past Surgical History:  Procedure Laterality Date  . DILATION AND CURETTAGE OF UTERUS    . TUBAL LIGATION  07/25/2011   Procedure: POST PARTUM TUBAL LIGATION;  Surgeon: Mora Bellman, MD;  Location: [facility];  Service: Gynecology;  Laterality: Bilateral;   Social History   Socioeconomic History  . Marital status: Married    Spouse name: Not on file  . Number of children: Not on file  . Years of education: Not on file  . Highest education level: Not on file  Occupational History  . Not on file  Social Needs  . Financial resource strain: Not on file  . Food insecurity:    Worry: Not on file    Inability: Not on file  . Transportation needs:    Medical: Not on file    Non-medical: Not on file  Tobacco Use  . Smoking status: Former Research scientist (life sciences)  . Smokeless tobacco: Never Used  Substance and Sexual Activity  . Alcohol use: No  . Drug use: No  . Sexual activity: Yes    Birth control/protection: None    Comment: tubal  Lifestyle  . Physical activity:    Days per week: Not on file    Minutes per session: Not on file  . Stress: Not on file  Relationships  . Social connections:    Talks on phone: Not on file    Gets together: Not on file    Attends religious service: Not on file    Active member of club or organization: Not on file    Attends meetings of clubs or organizations: Not on file    Relationship status: Not on file  . Intimate partner violence:    Fear of current or ex partner: Not on file    Emotionally abused: Not on file    Physically abused: Not on file    Forced sexual activity: Not on file  Other Topics Concern  . Not on file  Social History Narrative  . Not on file   Family History  Problem Relation Age of Onset  . Hypertension Mother   . Anesthesia problems Neg Hx   . Hypotension Neg Hx   . Malignant hyperthermia Neg Hx   . Pseudochol deficiency Neg Hx   . Colon cancer Neg Hx   . Esophageal cancer Neg Hx   . Inflammatory bowel disease Neg Hx    . Liver disease Neg Hx   . Pancreatic cancer Neg Hx   . Rectal cancer Neg Hx   . Stomach cancer Neg Hx   . Colon polyps Neg Hx    I have reviewed her medical, social, and family history in detail and updated the electronic medical record as necessary.    PHYSICAL EXAMINATION  BP 120/80   Pulse 84   Ht 4\' 11"  (1.499 m)   Wt 109 lb (49.4 kg)   LMP 05/04/2018 (Approximate)   BMI 22.02 kg/m  Wt Readings from Last 3 Encounters:  05/28/18 109 lb (49.4 kg)  04/21/18 111 lb (50.3 kg)  03/10/18 111 lb 9.6 oz (50.6 kg)  GEN: No acute distress PSYCH: Cooperative, without pressured speech EYE: Conjunctivae pink, sclerae anicteric ENT:  MMM CV: RR without R/Gs  RESP: CTAB posteriorly GI: NABS, soft, TTP throughout her abdomen upon light and deep palpation mostly centered around the region of her ventral hernia in the midline, volitional guarding still present, without rebound MSK/EXT: No LE edema SKIN: No jaundice NEURO:  Alert & Oriented x 3, no focal deficits   REVIEW OF DATA  I reviewed the following data at the time of this encounter:  GI Procedures and Studies  January 2020 colonoscopy - Non-thrombosed external hemorrhoids and non-thrombosed internal hemorrhoids found on digital rectal exam. Perianal skin tags found on perianal exam. - The examined portion of the ileum was normal. Biopsied. - Two 3 to 5 mm polyps in the transverse colon and at the hepatic flexure, removed with a cold snare. Resected and retrieved. - Normal mucosa in the entire examined colon. Biopsied. - Normal mucosa in the rectum. Biopsied. - Non-bleeding non-thrombosed external and internal hemorrhoids.  January 2020 enteroscopy - No gross lesions in esophagus. Biopsied for EoE. - 3 cm hiatal hernia. - No gross lesions in the stomach. Biopsied for HP. - No gross lesions in the duodenal bulb, in the first portion of the duodenum, in the second portion of the duodenum, in the third portion of the  duodenum and in the fourth portion of the duodenum. - Normal mucosa was found in the proximal jejunum. - Biopsies were taken with a cold forceps for histology in the duodenal bulb, in the second portion of the duodenum, in the third portion of the duodenum, in the fourth portion of the duodenum and in the proximal jejunum for Enteropathy/Celiac rule out.  Laboratory Studies  Reviewed in epic  Imaging Studies  No new studies to review   ASSESSMENT  Ms. Notch is a 48 y.o. female with a pmh significant for Multiple Gestations, Abdominal Hernia, Ovarian Cysts, hx of MDD/Anxiety, GERD, colon polyps (SSPs), ventral diastases/hernia, chronic Abdominal Pain.  The patient is seen today for evaluation and management of:  1. Iron deficiency anemia, unspecified iron deficiency anemia type   2. Chronic generalized abdominal pain   3. Ventral hernia without obstruction or gangrene   4. Gastroesophageal reflux disease, esophagitis presence not specified   5. History of colonic polyps   6. Dysphagia, unspecified type    This is a hemodynamically stable patient who continues to have significant chronic abdominal discomfort.  I wondered how much of her issues have stemmed around her ventral hernia and she has been evaluated by surgery and is planned for a attempt at surgical treatment later this month.  I described to the patient that she does have a true manifestations of GI proximal disorders in the setting of what looks to be GERD as well as hemorrhoids and colon polyps.  Her more recent imaging had shown complete resolution of previous findings on CT that had been concerning for possible enteritis and on enterography there is no evidence of any significant issues.  As such I suspect those symptoms were more result of a viral/microbial gastroenteritis rather than true structural issues.  Her evaluation in regards to her biopsies showed no overt evidence of inflammatory bowel disease either.  I  think a lot of her symptoms moving forward may remain functional however things will be important to evaluate after her surgery and to see how she has done overall.  She does still describe issues of dysphagia and I think it would be reasonable for [place] to consider an esophageal manometry at some point in the near future  so that we can exclude any sort of other dysmotility.  If the manometry is negative and she still has dysphagia then we can consider an empiric dilation thereafter.  We may consider a solid-phase gastric emptying study even though she does not have overt diabetes   All patient questions were answered, to the best of my ability, and the patient agrees to the aforementioned plan of action with follow-up as indicated.   PLAN  Laboratories as outlined below to evaluate if patient still has a persistent iron insufficiency Proceed to esophageal manometry in March or April to further evaluate persistent symptoms of dysphagia Continue iron once daily Continue omeprazole twice daily for now Proceed to surgery as already scheduled and we will try to obtain the records of her clinic visit Suspect some symptoms of her chronic discomforts are still related to a functional disorder but things may change after she has her hernia repaired Continue fiber supplementation 1-2 times daily May use Anusol suppositories as needed and/or Preparation H for hemorrhoids though not having overt bleeding currently Will consider a solid-phase gastric emptying study in the future Colonoscopy in 5 years (2025) Patient has persistent iron deficiency will need to consider the possibility of a capsule endoscopy   Orders Placed This Encounter  Procedures  . CBC  . IBC panel  . Ferritin  . Ambulatory referral to Gastroenterology    New Prescriptions   No medications on file   Modified Medications   Modified Medication Previous Medication   FERROUS GLUCONATE (FERGON) 324 MG TABLET ferrous gluconate (FERGON)  324 MG tablet      Take 1 tablet (324 mg total) by mouth daily with breakfast for 30 days.    Take 1 tablet (324 mg total) by mouth daily with breakfast.   OMEPRAZOLE (PRILOSEC) 40 MG CAPSULE omeprazole (PRILOSEC) 40 MG capsule      Take 1 capsule (40 mg total) by mouth 2 (two) times daily.    Take 1 capsule (40 mg total) by mouth 2 (two) times daily.    Planned Follow Up: No follow-ups on file.   Justice Britain, MD [facility] Office # [phone]

## 2018-05-28 NOTE — Patient Instructions (Addendum)
Your provider has requested that you go to the basement level for lab work before leaving today. Press "B" on the elevator. The lab is located at the first door on the left as you exit the elevator.   You have been scheduled for an esophageal manometry test at [facility] on 06/29/2018 at 8:30am. Please arrive 30 minutes prior to your procedure for registration. You will need to go to outpatient registration (1st floor of the hospital) first. Make certain to bring your insurance cards as well as a complete list of medications.  Please remember the following:  1) Do not take any muscle relaxants, xanax (alprazolam) or ativan for 1 day prior to your test as well as the day of the test.  2) Nothing to eat or drink after 12:00 midnight on the night before your test.  3) Hold all diabetic medications/insulin the morning of the test. You may eat and take your medications after the test.  It will take at least 2 weeks to receive the results of this test from your physician.  ------------------------------------------ ABOUT ESOPHAGEAL MANOMETRY Esophageal manometry (muh-NOM-uh-tree) is a test that gauges how well your esophagus works. Your esophagus is the long, muscular tube that connects your throat to your stomach. Esophageal manometry measures the rhythmic muscle contractions (peristalsis) that occur in your esophagus when you swallow. Esophageal manometry also measures the coordination and force exerted by the muscles of your esophagus.  During esophageal manometry, a thin, flexible tube (catheter) that contains sensors is passed through your nose, down your esophagus and into your stomach. Esophageal manometry can be helpful in diagnosing some mostly uncommon disorders that affect your esophagus.  Why it's done Esophageal manometry is used to evaluate the movement (motility) of food through the esophagus and into the stomach. The test measures how well the circular bands of muscle  (sphincters) at the top and bottom of your esophagus open and close, as well as the pressure, strength and pattern of the wave of esophageal muscle contractions that moves food along.  What you can expect Esophageal manometry is an outpatient procedure done without sedation. Most people tolerate it well. You may be asked to change into a hospital gown before the test starts.  During esophageal manometry  . While you are sitting up, a member of your health care team sprays your throat with a numbing medication or puts numbing gel in your nose or both.  . A catheter is guided through your nose into your esophagus. The catheter may be sheathed in a water-filled sleeve. It doesn't interfere with your breathing. However, your eyes may water, and you may gag. You may have a slight nosebleed from irritation.  . After the catheter is in place, you may be asked to lie on your back on an exam table, or you may be asked to remain seated.  . You then swallow small sips of water. As you do, a computer connected to the catheter records the pressure, strength and pattern of your esophageal muscle contractions.  . During the test, you'll be asked to breathe slowly and smoothly, remain as still as possible, and swallow only when you're asked to do so.  . A member of your health care team may move the catheter down into your stomach while the catheter continues its measurements.  . The catheter then is slowly withdrawn. The test usually lasts 20 to 30 minutes.  After esophageal manometry  When your esophageal manometry is complete, you may return to your  normal activities  This test typically takes 30-45 minutes to complete. ________________________________________________________________________________  Thank you for choosing me and [facility].  Dr. Rush Landmark

## 2018-05-29 ENCOUNTER — Other Ambulatory Visit: Payer: Self-pay

## 2018-05-29 DIAGNOSIS — D509 Iron deficiency anemia, unspecified: Secondary | ICD-10-CM

## 2018-05-31 ENCOUNTER — Other Ambulatory Visit: Payer: Self-pay | Admitting: Family Medicine

## 2018-05-31 DIAGNOSIS — R5383 Other fatigue: Secondary | ICD-10-CM

## 2018-05-31 NOTE — Progress Notes (Signed)
Patient ID: Bonnie Conrad, female   DOB: June 23, 1970, 48 y.o.   MRN: [id number]   Patient will be contacted to come in for a fasting beginning of the day cortisol level in follow-up of her chronic fatigue. Patient however is scheduled for an upcoming surgery for an epigastric hernia. Order will therefore be placed for her to have the lab in March.

## 2018-06-01 NOTE — Progress Notes (Signed)
Please schedule patient for a lab visit to recheck cortisol levels. NO EATING needs an 8:30 am appointment in the next 2 weeks.

## 2018-06-02 ENCOUNTER — Telehealth: Payer: Self-pay | Admitting: Family Medicine

## 2018-06-02 ENCOUNTER — Encounter: Payer: Self-pay | Admitting: Gastroenterology

## 2018-06-02 DIAGNOSIS — Z86010 Personal history of colonic polyps: Secondary | ICD-10-CM | POA: Insufficient documentation

## 2018-06-02 DIAGNOSIS — K439 Ventral hernia without obstruction or gangrene: Secondary | ICD-10-CM | POA: Insufficient documentation

## 2018-06-02 DIAGNOSIS — R1310 Dysphagia, unspecified: Secondary | ICD-10-CM | POA: Insufficient documentation

## 2018-06-02 DIAGNOSIS — D509 Iron deficiency anemia, unspecified: Secondary | ICD-10-CM | POA: Insufficient documentation

## 2018-06-02 NOTE — Telephone Encounter (Signed)
Called patient and scheduled her a lab appointment for 3/2 per PCP.

## 2018-06-02 NOTE — Progress Notes (Addendum)
Instrucciones Para Antes de la Ciruga   Su ciruga est programada para-(your procedure is scheduled on) February 21 at [place] [facility] - (Entrance A)    Por favor llame al [phone] si tiene algn problema en la maana de la ciruga. (please call if you have any problems the morning of surgery.)                  Recuerde: (Remember)   No coma alimentos ni tome lquidos, incluyendo agua, despus de la medianoche del  (Do not eat food or drink liquids including water after midnight on_______________   Bonnie Conrad estas medicinas en la maana de la ciruga con un SORBITO de agua (take these meds the morning of surgery with a SIP of water) dicyclomine (BENTYL), omeprazole (PRILOSEC), ondansetron (ZOFRAN ODT)   Puede cepillarse los dientes en la maana de la [place]. (you may brush your teeth the morning of surgery)   No use joyas, maquillaje de ojos, lpiz labial, crema para el cuerpo o esmalte de uas oscuro. (Do not wear jewelry, eye makeup, lipstick, body lotion, or dark fingernail polish)     Si va a ser ingresado despues de la ciruga, deje la [employer] en el carro hasta que se le haya asignado una habitacin. (If you are to be admitted after surgery, leave suitcase in car until your room has been assigned.)   A los pacientes que se les d de alta el mismo da no se les permitir manejar a casa.  (Patients discharged on the day of surgery will not be allowed to drive home)   Use ropa suelta y cmoda de regreso a casa. (wear loose comfortable clothes for ride home)   [facility]- Preparing For Surgery  Before surgery, you can play an important role. Because skin is not sterile, your skin needs to be as free of germs as possible. You can reduce the number of germs on your skin by washing with CHG (chlorahexidine gluconate) Soap before surgery.  CHG is an antiseptic cleaner which kills germs and bonds with the skin  to continue killing germs even after washing.    Oral Hygiene is also important to reduce your risk of infection.  Remember - BRUSH YOUR TEETH THE MORNING OF SURGERY WITH YOUR REGULAR TOOTHPASTE  Please do not use if you have an allergy to CHG or antibacterial soaps. If your skin becomes reddened/irritated stop using the CHG.  Do not shave (including legs and underarms) for at least 48 hours prior to first CHG shower. It is OK to shave your face.  Please follow these instructions carefully.   1. Shower the NIGHT BEFORE SURGERY and the MORNING OF SURGERY with CHG.   2. If you chose to wash your hair, wash your hair first as usual with your normal shampoo.  3. After you shampoo, rinse your hair and body thoroughly to remove the shampoo.  4. Use CHG as you would any other liquid soap. You can apply CHG directly to the skin and wash gently with a scrungie or a clean washcloth.   5. Apply the CHG Soap to your body ONLY FROM THE NECK DOWN.  Do not use on open wounds or open sores. Avoid contact with your eyes, ears, mouth and genitals (private parts). Wash Face and genitals (private parts)  with your normal soap.  6. Wash thoroughly, paying special attention to the area where your surgery will be performed.  7. Thoroughly rinse your body  with warm water from the neck down.  8. DO NOT shower/wash with your normal soap after using and rinsing off the CHG Soap.  9. Pat yourself dry with a CLEAN TOWEL.  10. Wear CLEAN PAJAMAS to bed the night before surgery, wear comfortable clothes the morning of surgery  11. Place CLEAN SHEETS on your bed the night of your first shower and DO NOT SLEEP WITH PETS.    Day of Surgery:  Do not apply any deodorants/lotions.  Please wear clean clothes to the hospital/surgery center.   Remember to brush your teeth WITH YOUR REGULAR TOOTHPASTE.

## 2018-06-03 ENCOUNTER — Other Ambulatory Visit: Payer: Self-pay

## 2018-06-03 ENCOUNTER — Encounter (HOSPITAL_COMMUNITY): Payer: Self-pay

## 2018-06-03 ENCOUNTER — Encounter (HOSPITAL_COMMUNITY)
Admission: RE | Admit: 2018-06-03 | Discharge: 2018-06-03 | Disposition: A | Discharge status: Home / Self Care | Payer: Medicaid Other | Source: Ambulatory Visit | Attending: Surgery | Admitting: Surgery

## 2018-06-03 DIAGNOSIS — Z01812 Encounter for preprocedural laboratory examination: Secondary | ICD-10-CM | POA: Insufficient documentation

## 2018-06-03 LAB — BASIC METABOLIC PANEL
ANION GAP: 10 (ref 5–15)
BUN: 7 mg/dL (ref 6–20)
CO2: 20 mmol/L — ABNORMAL LOW (ref 22–32)
Calcium: 9.2 mg/dL (ref 8.9–10.3)
Chloride: 110 mmol/L (ref 98–111)
Creatinine, Ser: 0.54 mg/dL (ref 0.44–1.00)
GFR calc Af Amer: >60 mL/min (ref >60)
GFR calc non Af Amer: >60 mL/min (ref >60)
Glucose, Bld: 105 mg/dL — ABNORMAL HIGH (ref 70–99)
Potassium: 3.5 mmol/L (ref 3.5–5.1)
Sodium: 140 mmol/L (ref 135–145)

## 2018-06-03 LAB — CBC WITH DIFFERENTIAL/PLATELET
Abs Immature Granulocytes: 0.04 10*3/uL (ref 0.00–0.07)
BASOS PCT: 0 %
Basophils Absolute: 0 10*3/uL (ref 0.0–0.1)
Eosinophils Absolute: 0.1 10*3/uL (ref 0.0–0.5)
Eosinophils Relative: 1 %
HCT: 39.9 % (ref 36.0–46.0)
Hemoglobin: 12.7 g/dL (ref 12.0–15.0)
Immature Granulocytes: 0 %
Lymphocytes Relative: 21 %
Lymphs Abs: 2 10*3/uL (ref 0.7–4.0)
MCH: 30.2 pg (ref 26.0–34.0)
MCHC: 31.8 g/dL (ref 30.0–36.0)
MCV: 95 fL (ref 80.0–100.0)
Monocytes Absolute: 0.6 10*3/uL (ref 0.1–1.0)
Monocytes Relative: 6 %
Neutro Abs: 6.6 10*3/uL (ref 1.7–7.7)
Neutrophils Relative %: 72 %
PLATELETS: 249 10*3/uL (ref 150–400)
RBC: 4.2 MIL/uL (ref 3.87–5.11)
RDW: 14.3 % (ref 11.5–15.5)
WBC: 9.2 10*3/uL (ref 4.0–10.5)
nRBC: 0 % (ref 0.0–0.2)

## 2018-06-03 NOTE — Progress Notes (Signed)
PCP - cammie Fulp Cardiologist - denies  Chest x-ray - not needed EKG - not needed Stress Test - denies ECHO - denies Cardiac Cath - denies  Anesthesia review: NO  Patient denies shortness of breath, fever, cough and chest pain at PAT appointment   Patient verbalized understanding of instructions that were given to them at the PAT appointment. Patient was also instructed that they will need to review over the PAT instructions again at home before surgery.

## 2018-06-05 ENCOUNTER — Other Ambulatory Visit: Payer: Self-pay

## 2018-06-05 ENCOUNTER — Encounter (HOSPITAL_COMMUNITY): Payer: Self-pay

## 2018-06-05 ENCOUNTER — Ambulatory Visit (HOSPITAL_COMMUNITY)
Admission: RE | Admit: 2018-06-05 | Discharge: 2018-06-05 | Disposition: A | Discharge status: Home / Self Care | Payer: Medicaid Other | Attending: Surgery | Admitting: Surgery

## 2018-06-05 ENCOUNTER — Ambulatory Visit (HOSPITAL_COMMUNITY): Payer: Medicaid Other | Admitting: Certified Registered Nurse Anesthetist

## 2018-06-05 ENCOUNTER — Encounter (HOSPITAL_COMMUNITY)
Admission: RE | Disposition: A | Discharge status: Home / Self Care | Payer: Self-pay | Source: Home / Self Care | Attending: Surgery

## 2018-06-05 DIAGNOSIS — D649 Anemia, unspecified: Secondary | ICD-10-CM | POA: Diagnosis not present

## 2018-06-05 DIAGNOSIS — K449 Diaphragmatic hernia without obstruction or gangrene: Secondary | ICD-10-CM | POA: Diagnosis not present

## 2018-06-05 DIAGNOSIS — K436 Other and unspecified ventral hernia with obstruction, without gangrene: Secondary | ICD-10-CM | POA: Insufficient documentation

## 2018-06-05 DIAGNOSIS — Z79899 Other long term (current) drug therapy: Secondary | ICD-10-CM | POA: Insufficient documentation

## 2018-06-05 DIAGNOSIS — R109 Unspecified abdominal pain: Secondary | ICD-10-CM | POA: Diagnosis present

## 2018-06-05 DIAGNOSIS — Z87891 Personal history of nicotine dependence: Secondary | ICD-10-CM | POA: Diagnosis not present

## 2018-06-05 HISTORY — PX: EPIGASTRIC HERNIA REPAIR: SHX404

## 2018-06-05 LAB — POCT PREGNANCY, URINE: Preg Test, Ur: NEGATIVE

## 2018-06-05 SURGERY — REPAIR, HERNIA, EPIGASTRIC, ADULT
Anesthesia: General | Site: Abdomen

## 2018-06-05 MED ORDER — ROCURONIUM BROMIDE 10 MG/ML (PF) SYRINGE
PREFILLED_SYRINGE | INTRAVENOUS | Status: DC | PRN
  Administered 2018-06-05: 50 mg via INTRAVENOUS

## 2018-06-05 MED ORDER — HYDROMORPHONE HCL 1 MG/ML IJ SOLN
INTRAMUSCULAR | Status: AC
  Administered 2018-06-05: 0.5 mg via INTRAVENOUS
  Filled 2018-06-05: qty 1

## 2018-06-05 MED ORDER — OXYCODONE HCL 5 MG/5ML PO SOLN: 5.0000 mg | Freq: Once | ORAL | Status: DC | PRN

## 2018-06-05 MED ORDER — DEXMEDETOMIDINE HCL IN NACL 200 MCG/50ML IV SOLN
INTRAVENOUS | Status: DC | PRN
  Administered 2018-06-05: 8 ug via INTRAVENOUS

## 2018-06-05 MED ORDER — 0.9 % SODIUM CHLORIDE (POUR BTL) OPTIME
TOPICAL | Status: DC | PRN
  Administered 2018-06-05: 1000 mL

## 2018-06-05 MED ORDER — OXYCODONE HCL 5 MG PO TABS
ORAL_TABLET | ORAL | Status: AC
  Filled 2018-06-05: qty 1

## 2018-06-05 MED ORDER — PHENYLEPHRINE 40 MCG/ML (10ML) SYRINGE FOR IV PUSH (FOR BLOOD PRESSURE SUPPORT)
PREFILLED_SYRINGE | INTRAVENOUS | Status: DC | PRN
  Administered 2018-06-05: 160 ug via INTRAVENOUS
  Administered 2018-06-05 (×2): 120 ug via INTRAVENOUS
  Administered 2018-06-05: 200 ug via INTRAVENOUS

## 2018-06-05 MED ORDER — CEFAZOLIN SODIUM-DEXTROSE 2-4 GM/100ML-% IV SOLN: 2.0000 g | INTRAVENOUS | Status: DC

## 2018-06-05 MED ORDER — FENTANYL CITRATE (PF) 250 MCG/5ML IJ SOLN
INTRAMUSCULAR | Status: AC
  Filled 2018-06-05: qty 5

## 2018-06-05 MED ORDER — ONDANSETRON HCL 4 MG/2ML IJ SOLN
INTRAMUSCULAR | Status: AC
  Filled 2018-06-05: qty 2

## 2018-06-05 MED ORDER — PROPOFOL 10 MG/ML IV BOLUS
INTRAVENOUS | Status: AC
  Filled 2018-06-05: qty 20

## 2018-06-05 MED ORDER — BUPIVACAINE HCL (PF) 0.25 % IJ SOLN
INTRAMUSCULAR | Status: AC
  Filled 2018-06-05: qty 30

## 2018-06-05 MED ORDER — FENTANYL CITRATE (PF) 100 MCG/2ML IJ SOLN
INTRAMUSCULAR | Status: AC
  Filled 2018-06-05: qty 2

## 2018-06-05 MED ORDER — MIDAZOLAM HCL 2 MG/2ML IJ SOLN
INTRAMUSCULAR | Status: DC | PRN
  Administered 2018-06-05: 2 mg via INTRAVENOUS

## 2018-06-05 MED ORDER — LIDOCAINE 2% (20 MG/ML) 5 ML SYRINGE
INTRAMUSCULAR | Status: AC
  Filled 2018-06-05: qty 5

## 2018-06-05 MED ORDER — GABAPENTIN 300 MG PO CAPS
300.0000 mg | ORAL_CAPSULE | ORAL | Status: AC
  Administered 2018-06-05: 300 mg via ORAL

## 2018-06-05 MED ORDER — ACETAMINOPHEN 325 MG PO TABS: 650.0000 mg | ORAL_TABLET | ORAL | Status: DC | PRN

## 2018-06-05 MED ORDER — HYDROMORPHONE HCL 1 MG/ML IJ SOLN
INTRAMUSCULAR | Status: AC
  Filled 2018-06-05: qty 0.5

## 2018-06-05 MED ORDER — DOCUSATE SODIUM 100 MG PO CAPS: 100.0000 mg | ORAL_CAPSULE | Freq: Two times a day (BID) | ORAL | 0 refills | Status: AC

## 2018-06-05 MED ORDER — MEPERIDINE HCL 50 MG/ML IJ SOLN: 6.2500 mg | INTRAMUSCULAR | Status: DC | PRN

## 2018-06-05 MED ORDER — CELECOXIB 200 MG PO CAPS
ORAL_CAPSULE | ORAL | Status: AC
  Administered 2018-06-05: 200 mg via ORAL
  Filled 2018-06-05: qty 1

## 2018-06-05 MED ORDER — OXYCODONE HCL 5 MG PO TABS: 5.0000 mg | ORAL_TABLET | ORAL | Status: DC | PRN

## 2018-06-05 MED ORDER — LIDOCAINE 2% (20 MG/ML) 5 ML SYRINGE
INTRAMUSCULAR | Status: DC | PRN
  Administered 2018-06-05: 60 mg via INTRAVENOUS

## 2018-06-05 MED ORDER — ACETAMINOPHEN 650 MG RE SUPP: 650.0000 mg | RECTAL | Status: DC | PRN

## 2018-06-05 MED ORDER — PROMETHAZINE HCL 25 MG/ML IJ SOLN
6.2500 mg | INTRAMUSCULAR | Status: DC | PRN
  Administered 2018-06-05: 6.25 mg via INTRAVENOUS

## 2018-06-05 MED ORDER — SODIUM CHLORIDE 0.9% FLUSH: 3.0000 mL | Freq: Two times a day (BID) | INTRAVENOUS | Status: DC

## 2018-06-05 MED ORDER — ACETAMINOPHEN 500 MG PO TABS
ORAL_TABLET | ORAL | Status: AC
  Administered 2018-06-05: 1000 mg via ORAL
  Filled 2018-06-05: qty 2

## 2018-06-05 MED ORDER — GABAPENTIN 300 MG PO CAPS
ORAL_CAPSULE | ORAL | Status: AC
  Administered 2018-06-05: 300 mg via ORAL
  Filled 2018-06-05: qty 1

## 2018-06-05 MED ORDER — ONDANSETRON HCL 4 MG/2ML IJ SOLN
INTRAMUSCULAR | Status: DC | PRN
  Administered 2018-06-05: 4 mg via INTRAVENOUS

## 2018-06-05 MED ORDER — HYDROMORPHONE HCL 1 MG/ML IJ SOLN
INTRAMUSCULAR | Status: DC | PRN
  Administered 2018-06-05: 0.5 mg via INTRAVENOUS

## 2018-06-05 MED ORDER — CELECOXIB 200 MG PO CAPS
200.0000 mg | ORAL_CAPSULE | ORAL | Status: AC
  Administered 2018-06-05: 200 mg via ORAL

## 2018-06-05 MED ORDER — BUPIVACAINE LIPOSOME 1.3 % IJ SUSP
20.0000 mL | INTRAMUSCULAR | Status: AC
  Administered 2018-06-05: 20 mL
  Filled 2018-06-05: qty 20

## 2018-06-05 MED ORDER — SODIUM CHLORIDE 0.9% FLUSH: 3.0000 mL | INTRAVENOUS | Status: DC | PRN

## 2018-06-05 MED ORDER — CHLORHEXIDINE GLUCONATE 4 % EX LIQD: 60.0000 mL | Freq: Once | CUTANEOUS | Status: DC

## 2018-06-05 MED ORDER — PROPOFOL 10 MG/ML IV BOLUS
INTRAVENOUS | Status: DC | PRN
  Administered 2018-06-05: 130 mg via INTRAVENOUS

## 2018-06-05 MED ORDER — DEXAMETHASONE SODIUM PHOSPHATE 10 MG/ML IJ SOLN
INTRAMUSCULAR | Status: DC | PRN
  Administered 2018-06-05: 5 mg via INTRAVENOUS

## 2018-06-05 MED ORDER — VANCOMYCIN HCL IN DEXTROSE 1-5 GM/200ML-% IV SOLN
INTRAVENOUS | Status: AC
  Filled 2018-06-05: qty 200

## 2018-06-05 MED ORDER — OXYCODONE HCL 5 MG PO TABS: 5.0000 mg | ORAL_TABLET | Freq: Once | ORAL | Status: DC | PRN

## 2018-06-05 MED ORDER — FENTANYL CITRATE (PF) 100 MCG/2ML IJ SOLN
INTRAMUSCULAR | Status: DC | PRN
  Administered 2018-06-05: 50 ug via INTRAVENOUS
  Administered 2018-06-05 (×2): 100 ug via INTRAVENOUS

## 2018-06-05 MED ORDER — MIDAZOLAM HCL 2 MG/2ML IJ SOLN
INTRAMUSCULAR | Status: AC
  Filled 2018-06-05: qty 2

## 2018-06-05 MED ORDER — ACETAMINOPHEN 500 MG PO TABS
1000.0000 mg | ORAL_TABLET | ORAL | Status: AC
  Administered 2018-06-05: 1000 mg via ORAL

## 2018-06-05 MED ORDER — SUGAMMADEX SODIUM 200 MG/2ML IV SOLN
INTRAVENOUS | Status: DC | PRN
  Administered 2018-06-05: 100 mg via INTRAVENOUS

## 2018-06-05 MED ORDER — TRAMADOL HCL 50 MG PO TABS: 50.0000 mg | ORAL_TABLET | Freq: Four times a day (QID) | ORAL | 0 refills | Status: AC | PRN

## 2018-06-05 MED ORDER — PROMETHAZINE HCL 25 MG/ML IJ SOLN
INTRAMUSCULAR | Status: AC
  Filled 2018-06-05: qty 1

## 2018-06-05 MED ORDER — FENTANYL CITRATE (PF) 100 MCG/2ML IJ SOLN
25.0000 ug | INTRAMUSCULAR | Status: DC | PRN
  Administered 2018-06-05: 50 ug via INTRAVENOUS

## 2018-06-05 MED ORDER — VANCOMYCIN HCL 1000 MG IV SOLR
INTRAVENOUS | Status: DC | PRN
  Administered 2018-06-05: 1000 mg via INTRAVENOUS

## 2018-06-05 MED ORDER — SODIUM CHLORIDE 0.9 % IV SOLN: 250.0000 mL | INTRAVENOUS | Status: DC | PRN

## 2018-06-05 MED ORDER — CEFAZOLIN SODIUM-DEXTROSE 2-4 GM/100ML-% IV SOLN
INTRAVENOUS | Status: AC
  Filled 2018-06-05: qty 100

## 2018-06-05 MED ORDER — BUPIVACAINE-EPINEPHRINE 0.25% -1:200000 IJ SOLN
INTRAMUSCULAR | Status: DC | PRN
  Administered 2018-06-05: 20 mL

## 2018-06-05 MED ORDER — HYDROMORPHONE HCL 1 MG/ML IJ SOLN
0.2500 mg | INTRAMUSCULAR | Status: DC | PRN
  Administered 2018-06-05 (×2): 0.5 mg via INTRAVENOUS

## 2018-06-05 MED ORDER — LACTATED RINGERS IV SOLN
INTRAVENOUS | Status: DC | PRN
  Administered 2018-06-05 (×2): via INTRAVENOUS

## 2018-06-05 SURGICAL SUPPLY — 36 items
ADH SKN CLS APL DERMABOND .7 (GAUZE/BANDAGES/DRESSINGS) ×1
BLADE CLIPPER SURG (BLADE) ×2 IMPLANT
CANISTER SUCT 3000ML PPV (MISCELLANEOUS) ×2 IMPLANT
CHLORAPREP W/TINT 26ML (MISCELLANEOUS) ×3 IMPLANT
COVER SURGICAL LIGHT HANDLE (MISCELLANEOUS) ×3 IMPLANT
COVER WAND RF STERILE (DRAPES) ×3 IMPLANT
DERMABOND ADVANCED (GAUZE/BANDAGES/DRESSINGS) ×2
DERMABOND ADVANCED .7 DNX12 (GAUZE/BANDAGES/DRESSINGS) ×1 IMPLANT
DRAPE LAPAROTOMY 100X72 PEDS (DRAPES) ×3 IMPLANT
DRSG TEGADERM 4X4.75 (GAUZE/BANDAGES/DRESSINGS) ×2 IMPLANT
ELECT CAUTERY BLADE 6.4 (BLADE) ×2 IMPLANT
ELECT REM PT RETURN 9FT ADLT (ELECTROSURGICAL) ×3
ELECTRODE REM PT RTRN 9FT ADLT (ELECTROSURGICAL) ×1 IMPLANT
GAUZE 4X4 16PLY RFD (DISPOSABLE) ×1 IMPLANT
GAUZE SPONGE 2X2 8PLY STRL LF (GAUZE/BANDAGES/DRESSINGS) ×1 IMPLANT
GAUZE SPONGE 4X4 12PLY STRL (GAUZE/BANDAGES/DRESSINGS) ×2 IMPLANT
GLOVE BIO SURGEON STRL SZ 6 (GLOVE) ×3 IMPLANT
GLOVE INDICATOR 6.5 STRL GRN (GLOVE) ×3 IMPLANT
GOWN STRL REUS W/ TWL LRG LVL3 (GOWN DISPOSABLE) ×2 IMPLANT
GOWN STRL REUS W/TWL LRG LVL3 (GOWN DISPOSABLE) ×9
KIT BASIN OR (CUSTOM PROCEDURE TRAY) ×3 IMPLANT
KIT TURNOVER KIT B (KITS) ×3 IMPLANT
MESH VENTRALEX ST 2.5 CRC MED (Mesh General) ×2 IMPLANT
NDL HYPO 25GX1X1/2 BEV (NEEDLE) ×1 IMPLANT
NEEDLE HYPO 25GX1X1/2 BEV (NEEDLE) ×3 IMPLANT
NS IRRIG 1000ML POUR BTL (IV SOLUTION) ×3 IMPLANT
PACK GENERAL/GYN (CUSTOM PROCEDURE TRAY) ×3 IMPLANT
PAD ARMBOARD 7.5X6 YLW CONV (MISCELLANEOUS) ×3 IMPLANT
PENCIL BUTTON HOLSTER BLD 10FT (ELECTRODE) ×2 IMPLANT
SPONGE GAUZE 2X2 STER 10/PKG (GAUZE/BANDAGES/DRESSINGS) ×2
SUT ETHIBOND 0 MO6 C/R (SUTURE) ×3 IMPLANT
SUT MNCRL AB 4-0 PS2 18 (SUTURE) ×3 IMPLANT
SUT VIC AB 3-0 SH 27 (SUTURE) ×3
SUT VIC AB 3-0 SH 27X BRD (SUTURE) ×1 IMPLANT
SYR CONTROL 10ML LL (SYRINGE) ×3 IMPLANT
TOWEL GREEN STERILE FF (TOWEL DISPOSABLE) ×2 IMPLANT

## 2018-06-05 NOTE — Anesthesia Preprocedure Evaluation (Signed)
Anesthesia Evaluation  Patient identified by MRN, date of birth, ID band Patient awake    Reviewed: Allergy & Precautions, H&P , NPO status , Patient's Chart, lab work & pertinent test results  Airway Mallampati: III  TM Distance: >3 FB Neck ROM: full    Dental no notable dental hx. (+) Teeth Intact   Pulmonary neg pulmonary ROS, Current Smoker,    Pulmonary exam normal breath sounds clear to auscultation       Cardiovascular negative cardio ROS   Rhythm:regular Rate:Normal     Neuro/Psych Anxiety Depression negative neurological ROS  negative psych ROS   GI/Hepatic Neg liver ROS, GERD  ,  Endo/Other  negative endocrine ROS  Renal/GU negative Renal ROS  negative genitourinary   Musculoskeletal negative musculoskeletal ROS (+)   Abdominal Normal abdominal exam  (+)   Peds negative pediatric ROS (+)  Hematology negative hematology ROS (+)   Anesthesia Other Findings   Reproductive/Obstetrics negative OB ROS                             Anesthesia Physical  Anesthesia Plan  ASA: II  Anesthesia Plan: General   Post-op Pain Management:    Induction: Intravenous  PONV Risk Score and Plan: 2 and Ondansetron and Midazolam  Airway Management Planned: Oral ETT  Additional Equipment:   Intra-op Plan:   Post-operative Plan: Extubation in OR  Informed Consent: I have reviewed the patients History and Physical, chart, labs and discussed the procedure including the risks, benefits and alternatives for the proposed anesthesia with the patient or authorized representative who has indicated his/her understanding and acceptance.     Dental advisory given  Plan Discussed with: Anesthesiologist, CRNA and Surgeon  Anesthesia Plan Comments:         Anesthesia Quick Evaluation

## 2018-06-05 NOTE — Op Note (Signed)
Operative Note  Bonnie Conrad  [id number]  [id number]  06/05/2018   Surgeon: Vikki Ports A ConnorMD  Assistant: none  Procedure performed: repair of incarcerated epigastric hernia with mesh  Preop diagnosis: incarcerated epigastric hernia Post-op diagnosis/intraop findings: same  Specimens: no Retained items: no EBL: minimal cc Complications: none  Description of procedure: After obtaining informed consent the patient was taken to the operating room and placed supine on operating room table wheregeneral endotracheal anesthesia was initiated, preoperative antibiotics were administered, SCDs applied, and a formal timeout was performed. The abdomen was prepped and draped in usual sterile fashion. A small vertical midline incision was made over the region of the palpable incarcerated hernia and then cautery was used to divide the soft tissues until the hernia sac was encountered. This was then skeletonized down to the level of the fascia and the fascia cleared off circumferentially. The hernia contained a large amount of chronically incarcerated falciform fat which was excised. The fascial defect was 2 cm wide by 1 cm craniocaudal and the patient is noted to have a very attenuated abdominal wall as the hernia lies within a midline diastasis. A 2.5 inch ventralex coated mesh patch was brought onto the field and this was secured to the abdominal wall using trans-fascial sutures of 0 Ethibond through the ridge of the mesh in the 4 cardinal directions. The mesh lies flush against the abdominal wall and the coated side was confirmed to be facing the viscera and no structures were entrapped between the mesh and the abdominal wall. The tails were then trimmed and the fascial defect was closed transversely with interrupted 0 Ethibonds, each of which also caught a small bite of the trimmed mesh tails to keep the mesh tacked up to the fascial closure. Hemostasis was ensured within the wound and  Exparel mixed with Marcaine was infiltrated circumferentially within the fascia, soft tissues and subcutaneous space. Interrupted 3-0 Vicryl's were used to close down some of the fatty tissue to reduce the dead space and interrupted deep dermal 30 Vicryls followed by running subcuticular Monocryl were then used to close the incision. A light pressure dressing of gauze and Tegaderm was then applied. The patient was then awakened, extubated and taken to PACU in stable condition.   All counts were correct at the completion of the case.

## 2018-06-05 NOTE — Anesthesia Procedure Notes (Signed)
Procedure Name: Intubation Date/Time: 06/05/2018 1:33 PM Performed by: Inda Coke, CRNA Pre-anesthesia Checklist: Patient identified, Emergency Drugs available, Suction available and Patient being monitored Patient Re-evaluated:Patient Re-evaluated prior to induction Oxygen Delivery Method: Circle System Utilized Preoxygenation: Pre-oxygenation with 100% oxygen Induction Type: IV induction Ventilation: Mask ventilation without difficulty Laryngoscope Size: Mac and 3 Grade View: Grade I Tube type: Oral Tube size: 7.0 mm Number of attempts: 1 Airway Equipment and Method: Stylet and Oral airway Placement Confirmation: ETT inserted through vocal cords under direct vision,  positive ETCO2 and breath sounds checked- equal and bilateral Secured at: 21 cm Tube secured with: Tape Dental Injury: Teeth and Oropharynx as per pre-operative assessment

## 2018-06-05 NOTE — Progress Notes (Signed)
Patient stated that she will have her sister stay with her overnight tonight after surgery

## 2018-06-05 NOTE — Interval H&P Note (Signed)
History and Physical Interval Note:  06/05/2018 12:47 PM  [place]  has presented today for surgery, with the diagnosis of incarcerated epigastric hernia  The various methods of treatment have been discussed with the patient and family. After consideration of risks, benefits and other options for treatment, the patient has consented to  Procedure(s): Glenn (N/A) as a surgical intervention .  The patient's history has been reviewed, patient examined, no change in status, stable for surgery.  I have reviewed the patient's chart and labs.  Questions were answered to the patient's satisfaction.     Hazell Siwik Rich Brave

## 2018-06-05 NOTE — Discharge Instructions (Signed)
HERNIA REPAIR: POST OP INSTRUCTIONS  ######################################################################  EAT Gradually transition to a high fiber diet with a fiber supplement over the next few weeks after discharge.  Start with a pureed / full liquid diet (see below)  WALK Walk an hour a day.  Control your pain to do that.    CONTROL PAIN Control pain so that you can walk, sleep, tolerate sneezing/coughing, and go up/down stairs.  HAVE A BOWEL MOVEMENT DAILY Keep your bowels regular to avoid problems.  OK to try a laxative to override constipation.  OK to use an antidairrheal to slow down diarrhea.  Call if not better after 2 tries  CALL IF YOU HAVE PROBLEMS/CONCERNS Call if you are still struggling despite following these instructions. Call if you have concerns not answered by these instructions  ######################################################################     1. DIET: Follow a light bland diet the first 24 hours after arrival home, such as soup, liquids, crackers, etc.  Be sure to include lots of fluids daily.  Advance to a low fat / high fiber diet over the next few days after surgery.  Avoid fast food or heavy meals the first week as your are more likely to get nauseated.    2. Take your usually prescribed home medications unless otherwise directed.  3. PAIN CONTROL: a. Pain is best controlled by a usual combination of three different methods TOGETHER: i. Ice/Heat ii. Over the counter pain medication iii. Prescription pain medication b. Most patients will experience some swelling and bruising around the hernia(s) such as the bellybutton, groins, or old incisions.  Ice packs or heating pads (30-60 minutes up to 6 times a day) will help. Use ice for the first few days to help decrease swelling and bruising, then switch to heat to help relax tight/sore spots and speed recovery.  Some people prefer to use ice alone, heat alone, alternating between ice & heat.   Experiment to what works for you.  Swelling and bruising can take several weeks to resolve.   c. It is helpful to take an over-the-counter pain medication regularly for the first few weeks.  Choose one of the following that works best for you: i. Naproxen (Aleve, etc)  Two 220mg  tabs twice a day ii. Ibuprofen (Advil, etc) Three 200mg  tabs four times a day (every meal & bedtime) iii. Acetaminophen (Tylenol, etc) 325-650mg  four times a day (every meal & bedtime) d. A  prescription for pain medication should be given to you upon discharge.  Take your pain medication as prescribed.  i. If you are having problems/concerns with the prescription medicine (does not control pain, nausea, vomiting, rash, itching, etc), please call us [phone] to see if we need to switch you to a different pain medicine that will work better for you and/or control your side effect better. ii. If you need a refill on your pain medication, please contact your pharmacy.  They will contact our office to request authorization. Prescriptions will not be filled after 5 pm or on week-ends.  4. Avoid getting constipated.  Between the surgery and the pain medications, it is common to experience some constipation.  Increasing fluid intake and taking a fiber supplement (such as Metamucil, Citrucel, FiberCon, MiraLax, etc) 1-2 times a day regularly will usually help prevent this problem from occurring.  A mild laxative (prune juice, Milk of Magnesia, MiraLax, etc) should be taken according to package directions if there are no bowel movements after 48 hours.    5. Wash / shower  every day.  You may shower over the bandage which is waterproof.    6. Remove the bandage 3 days after surgery. You may replace a dressing/Band-Aid to cover the incision for comfort if you wish. You may leave the incisions open to air.  Continue to shower over incision(s) after the dressing is off.  7. ACTIVITIES as tolerated:   a. You may resume regular  (light) daily activities beginning the next day--such as daily self-care, walking, climbing stairs--gradually increasing activities as tolerated.  Control your pain so that you can walk an hour a day.  If you can walk 30 minutes without difficulty, it is safe to try more intense activity such as jogging, treadmill, bicycling, low-impact aerobics, swimming, etc. b. Refrain from the most intensive and strenuous activity such as sit-ups, heavy lifting, contact sports, etc  Refrain from any heavy lifting or straining until 6 weeks after surgery.   c. DO NOT PUSH THROUGH PAIN.  Let pain be your guide: If it hurts to do something, don't do it.  Pain is your body warning you to avoid that activity for another week until the pain goes down. d. You may drive when you are no longer taking prescription pain medication, you can comfortably wear a seatbelt, and you can safely maneuver your car and apply brakes. e. Dennis Bast may have sexual intercourse when it is comfortable.   8. FOLLOW UP in our office a. Please call [facility] at [phone] to set up an appointment to see your surgeon in the office for a follow-up appointment approximately 2-3 weeks after your surgery. b. Make sure that you call for this appointment the day you arrive home to insure a convenient appointment time.  9.  If you have disability of FMLA / Family leave forms, please bring the forms to the office for processing.  (do not give to your surgeon).  WHEN TO CALL us [phone]: 1. Poor pain control 2. Reactions / problems with new medications (rash/itching, nausea, etc)  3. Fever over 101.5 F (38.5 C) 4. Inability to urinate 5. Nausea and/or vomiting 6. Worsening swelling or bruising 7. Continued bleeding from incision. 8. Increased pain, redness, or drainage from the incision   The clinic staff is available to answer your questions during regular business hours (8:30am-5pm).  Please dont hesitate to call and ask to speak to one of our  nurses for clinical concerns.   If you have a medical emergency, go to the nearest emergency room or call 911.  A surgeon from [facility] is always on call at the hospitals in [facility], [place], [place], [place], [place]  [address] ?  [address] [address], [place], [place]   [address] MAIN: [phone] ? TOLL FREE: [phone] ? FAX: [phone] [email or web address]

## 2018-06-05 NOTE — Transfer of Care (Signed)
Immediate Anesthesia Transfer of Care Note  Patient: Bonnie Conrad  Procedure(s) Performed: OPEN REPAIR OF INCARCERATED EPIGASTRIC HERNIA WITH MESH (N/A Abdomen)  Patient Location: PACU  Anesthesia Type:General  Level of Consciousness: awake, alert  and oriented  Airway & Oxygen Therapy: Patient Spontanous Breathing and Patient connected to nasal cannula oxygen  Post-op Assessment: Report given to RN, Post -op Vital signs reviewed and stable and Patient moving all extremities  Post vital signs: Reviewed and stable  Last Vitals:  Vitals Value Taken Time  BP 133/82 06/05/2018  3:07 PM  Temp    Pulse 95 06/05/2018  3:09 PM  Resp 11 06/05/2018  3:09 PM  SpO2 98 % 06/05/2018  3:09 PM  Vitals shown include unvalidated device data.  Last Pain:  Vitals:   06/05/18 1239  TempSrc:   PainSc: 6       Patients Stated Pain Goal: 3 (97/35/32 9924)  Complications: No apparent anesthesia complications

## 2018-06-08 ENCOUNTER — Encounter (HOSPITAL_COMMUNITY): Payer: Self-pay | Admitting: Surgery

## 2018-06-08 NOTE — Anesthesia Postprocedure Evaluation (Signed)
Anesthesia Post Note  Patient: Bonnie Conrad  Procedure(s) Performed: OPEN REPAIR OF INCARCERATED EPIGASTRIC HERNIA WITH MESH (N/A Abdomen)     Patient location during evaluation: PACU Anesthesia Type: General Level of consciousness: awake and alert Pain management: pain level controlled Vital Signs Assessment: post-procedure vital signs reviewed and stable Respiratory status: spontaneous breathing, nonlabored ventilation and respiratory function stable Cardiovascular status: blood pressure returned to baseline and stable Postop Assessment: no apparent nausea or vomiting Anesthetic complications: no    Last Vitals:  Vitals:   06/05/18 1700 06/05/18 1720  BP: (!) 152/84 132/84  Pulse: 93 94  Resp: 20 12  Temp: (!) 36.4 C   SpO2: 93% 98%    Last Pain:  Vitals:   06/05/18 1637  TempSrc:   PainSc: Asleep   Pain Goal: Patients Stated Pain Goal: 3 (06/05/18 1239)                 [name]

## 2018-06-15 ENCOUNTER — Ambulatory Visit: Payer: Medicaid Other | Attending: Family Medicine

## 2018-06-15 DIAGNOSIS — R5383 Other fatigue: Secondary | ICD-10-CM | POA: Diagnosis not present

## 2018-06-19 LAB — CORTISOL, FREE: Cortisol, Free Dialysis, LCMS: 0.248 ug/dL

## 2018-06-20 ENCOUNTER — Emergency Department (HOSPITAL_COMMUNITY)
Admission: EM | Admit: 2018-06-20 | Discharge: 2018-06-20 | Disposition: A | Discharge status: Home / Self Care | Payer: Medicaid Other | Attending: Emergency Medicine | Admitting: Emergency Medicine

## 2018-06-20 ENCOUNTER — Other Ambulatory Visit: Payer: Self-pay

## 2018-06-20 ENCOUNTER — Emergency Department (HOSPITAL_COMMUNITY): Payer: Medicaid Other

## 2018-06-20 ENCOUNTER — Encounter (HOSPITAL_COMMUNITY): Payer: Self-pay

## 2018-06-20 DIAGNOSIS — R05 Cough: Secondary | ICD-10-CM | POA: Diagnosis not present

## 2018-06-20 DIAGNOSIS — F17200 Nicotine dependence, unspecified, uncomplicated: Secondary | ICD-10-CM | POA: Insufficient documentation

## 2018-06-20 DIAGNOSIS — R1013 Epigastric pain: Secondary | ICD-10-CM | POA: Insufficient documentation

## 2018-06-20 DIAGNOSIS — J069 Acute upper respiratory infection, unspecified: Secondary | ICD-10-CM | POA: Diagnosis not present

## 2018-06-20 DIAGNOSIS — B9789 Other viral agents as the cause of diseases classified elsewhere: Secondary | ICD-10-CM | POA: Diagnosis not present

## 2018-06-20 DIAGNOSIS — Z79899 Other long term (current) drug therapy: Secondary | ICD-10-CM | POA: Insufficient documentation

## 2018-06-20 DIAGNOSIS — R059 Cough, unspecified: Secondary | ICD-10-CM

## 2018-06-20 LAB — I-STAT BETA HCG BLOOD, ED (MC, WL, AP ONLY): I-stat hCG, quantitative: <5 m[IU]/mL (ref <5)

## 2018-06-20 LAB — CBC
HEMATOCRIT: 42.4 % (ref 36.0–46.0)
HEMOGLOBIN: 13.5 g/dL (ref 12.0–15.0)
MCH: 30.5 pg (ref 26.0–34.0)
MCHC: 31.8 g/dL (ref 30.0–36.0)
MCV: 95.7 fL (ref 80.0–100.0)
Platelets: 284 10*3/uL (ref 150–400)
RBC: 4.43 MIL/uL (ref 3.87–5.11)
RDW: 13.8 % (ref 11.5–15.5)
WBC: 11.6 10*3/uL — ABNORMAL HIGH (ref 4.0–10.5)
nRBC: 0 % (ref 0.0–0.2)

## 2018-06-20 LAB — COMPREHENSIVE METABOLIC PANEL
ALT: 14 U/L (ref 0–44)
AST: 17 U/L (ref 15–41)
Albumin: 4.7 g/dL (ref 3.5–5.0)
Alkaline Phosphatase: 79 U/L (ref 38–126)
Anion gap: 6 (ref 5–15)
BUN: 9 mg/dL (ref 6–20)
CO2: 26 mmol/L (ref 22–32)
Calcium: 9.4 mg/dL (ref 8.9–10.3)
Chloride: 107 mmol/L (ref 98–111)
Creatinine, Ser: 0.56 mg/dL (ref 0.44–1.00)
GFR calc Af Amer: >60 mL/min (ref >60)
GFR calc non Af Amer: >60 mL/min (ref >60)
Glucose, Bld: 90 mg/dL (ref 70–99)
POTASSIUM: 3.5 mmol/L (ref 3.5–5.1)
Sodium: 139 mmol/L (ref 135–145)
TOTAL PROTEIN: 7.9 g/dL (ref 6.5–8.1)
Total Bilirubin: 0.4 mg/dL (ref 0.3–1.2)

## 2018-06-20 LAB — GROUP A STREP BY PCR: Group A Strep by PCR: NOT DETECTED

## 2018-06-20 LAB — URINALYSIS, ROUTINE W REFLEX MICROSCOPIC
BACTERIA UA: NONE SEEN
Bilirubin Urine: NEGATIVE
Glucose, UA: NEGATIVE mg/dL
Ketones, ur: NEGATIVE mg/dL
Leukocytes,Ua: NEGATIVE
NITRITE: NEGATIVE
Protein, ur: NEGATIVE mg/dL
Specific Gravity, Urine: 1.005 (ref 1.005–1.030)
pH: 7 (ref 5.0–8.0)

## 2018-06-20 LAB — LIPASE, BLOOD: Lipase: 31 U/L (ref 11–51)

## 2018-06-20 MED ORDER — FLUTICASONE PROPIONATE 50 MCG/ACT NA SUSP: 1.0000 | Freq: Every day | NASAL | 0 refills | Status: DC

## 2018-06-20 MED ORDER — DM-GUAIFENESIN ER 30-600 MG PO TB12: 1.0000 | ORAL_TABLET | Freq: Two times a day (BID) | ORAL | 0 refills | Status: AC

## 2018-06-20 MED ORDER — SODIUM CHLORIDE 0.9% FLUSH: 3.0000 mL | Freq: Once | INTRAVENOUS | Status: DC

## 2018-06-20 NOTE — ED Triage Notes (Addendum)
Patient c/o fever and sore throat x 2 weeks. Patient states she had a hernia surgery approx 2 weeks ago and is still having pain around the incision.

## 2018-06-20 NOTE — ED Provider Notes (Signed)
[facility] DEPT Provider Note   CSN: [id number] Arrival date & time: 06/20/18  1643    History   Chief Complaint Chief Complaint  Patient presents with  . Fever  . Sore Throat  . Post-op Problem    HPI Orelia Brandstetter is a 48 y.o. female.     HPI   Patient is a 47 year old female with a history of anemia, arthritis, recent incarcerated epigastric hernia with mesh repair on 06-05-2018 with Dr. Windle Guard presenting for sinus congestion, sore throat, cough, and pain around her incision site.  Patient reports that she has had a cough, subjective fevers at home, and congestion and rhinorrhea over the past 3 days.  Patient also reports she has had some progressive discomfort around her surgical site, mostly superior to the site.  She denies any erythema or drainage from the site.  Patient denies any shortness of breath or chest pain.  Patient does report that she had some scant, blood-tinged sputum yesterday with her cough.  She denies any recent immobilization other than her surgery, hormone use, cancer treatment, or history of DVT/PE.  No lower extremity edema or calf tenderness.  No over-the-counter remedies for her symptoms prior to arrival.  [employer] Spanish interpreter used for encounter.  Past Medical History:  Diagnosis Date  . Abdominal hernia   . Anemia   . Anxiety   . Arthritis   . Blood transfusion without reported diagnosis   . Depression    hosp during pregnancy at [facility]  . Hernia of abdominal cavity 2009  . Ovarian cyst     Patient Active Problem List   Diagnosis Date Noted  . Iron deficiency anemia 06/02/2018  . Dysphagia 06/02/2018  . History of colonic polyps 06/02/2018  . Ventral hernia without obstruction or gangrene 06/02/2018  . Abnormal CT of the abdomen 03/09/2018  . Non-intractable vomiting 03/09/2018  . Chronic generalized abdominal pain 03/09/2018  . Gastroesophageal reflux disease 03/09/2018  . Rectal  bleeding 03/09/2018  . Hemorrhoids 03/09/2018  . Major depressive disorder, recurrent episode, severe, with psychotic behavior ([place]) 04/03/2011  . Severe major depression with psychotic features ([place]) 03/06/2011  . Hernia of abdominal cavity 03/06/2011    Past Surgical History:  Procedure Laterality Date  . COLONOSCOPY WITH ESOPHAGOGASTRODUODENOSCOPY (EGD)    . DILATION AND CURETTAGE OF UTERUS    . EPIGASTRIC HERNIA REPAIR N/A 06/05/2018   Procedure: OPEN REPAIR OF INCARCERATED EPIGASTRIC HERNIA WITH MESH;  Surgeon: Clovis Riley, MD;  Location: [facility];  Service: General;  Laterality: N/A;  . TUBAL LIGATION  07/25/2011   Procedure: POST PARTUM TUBAL LIGATION;  Surgeon: Mora Bellman, MD;  Location: [facility];  Service: Gynecology;  Laterality: Bilateral;     OB History    Gravida  9   Para  6   Term  6   Preterm  0   AB  3   Living  6     SAB  3   TAB  0   Ectopic  0   Multiple  0   Live Births  5            Home Medications    Prior to Admission medications   Medication Sig Start Date End Date Taking? Authorizing Provider  dicyclomine (BENTYL) 10 MG capsule Take 1 capsule (10 mg total) by mouth 4 (four) times daily -  before meals and at bedtime. To relieve abdominal pain 03/10/18  Yes Fulp, Cammie, MD  docusate sodium (  COLACE) 100 MG capsule Take 1 capsule (100 mg total) by mouth 2 (two) times daily for 30 days. 06/05/18 07/05/18 Yes Clovis Riley, MD  ferrous gluconate (FERGON) 324 MG tablet Take 1 tablet (324 mg total) by mouth daily with breakfast for 30 days. 05/28/18 06/27/18 Yes Mansouraty, Telford Nab., MD  omeprazole (PRILOSEC) 40 MG capsule Take 1 capsule (40 mg total) by mouth 2 (two) times daily. 05/28/18  Yes Mansouraty, Telford Nab., MD  ondansetron (ZOFRAN ODT) 4 MG disintegrating tablet Take 1 tablet (4 mg total) by mouth every 8 (eight) hours as needed for nausea or vomiting. 12/16/17   Kinnie Feil, PA-C  traMADol (ULTRAM) 50 MG tablet Take 1  tablet (50 mg total) by mouth every 6 (six) hours as needed. Patient taking differently: Take 50 mg by mouth every 6 (six) hours as needed for moderate pain.  06/05/18 06/05/19  Clovis Riley, MD    Family History Family History  Problem Relation Age of Onset  . Hypertension Mother   . Anesthesia problems Neg Hx   . Hypotension Neg Hx   . Malignant hyperthermia Neg Hx   . Pseudochol deficiency Neg Hx   . Colon cancer Neg Hx   . Esophageal cancer Neg Hx   . Inflammatory bowel disease Neg Hx   . Liver disease Neg Hx   . Pancreatic cancer Neg Hx   . Rectal cancer Neg Hx   . Stomach cancer Neg Hx   . Colon polyps Neg Hx     Social History Social History   Tobacco Use  . Smoking status: Current Some Day Smoker  . Smokeless tobacco: Never Used  . Tobacco comment: a few months ago  Substance Use Topics  . Alcohol use: No  . Drug use: No     Allergies   Penicillins; Morphine and related; and Vicodin [hydrocodone-acetaminophen]   Review of Systems Review of Systems  Constitutional: Positive for chills. Negative for fever.  HENT: Positive for congestion, rhinorrhea and sore throat. Negative for sinus pain.   Eyes: Negative for visual disturbance.  Respiratory: Positive for cough. Negative for chest tightness and shortness of breath.   Cardiovascular: Negative for chest pain, palpitations and leg swelling.  Gastrointestinal: Negative for abdominal pain, constipation, diarrhea, nausea and vomiting.  Genitourinary: Negative for dysuria and flank pain.  Musculoskeletal: Negative for back pain and myalgias.  Skin: Negative for rash.  Neurological: Negative for dizziness, syncope, light-headedness and headaches.     Physical Exam Updated Vital Signs BP 132/80 (BP Location: Left Arm)   Pulse 76   Temp 98.8 F (37.1 C) (Oral)   Resp 16   Ht 4\' 10"  (1.473 m)   Wt 50.3 kg   LMP 05/27/2018   SpO2 100%   BMI 23.20 kg/m   Physical Exam Vitals signs and nursing note  reviewed.  Constitutional:      General: She is not in acute distress.    Appearance: She is well-developed.  HENT:     Head: Normocephalic and atraumatic.     Right Ear: Tympanic membrane normal.     Left Ear: Tympanic membrane normal.  Eyes:     Conjunctiva/sclera: Conjunctivae normal.     Pupils: Pupils are equal, round, and reactive to light.  Neck:     Musculoskeletal: Normal range of motion and neck supple.  Cardiovascular:     Rate and Rhythm: Normal rate and regular rhythm.     Heart sounds: S1 normal and S2 normal. No  murmur.  Pulmonary:     Effort: Pulmonary effort is normal.     Breath sounds: Normal breath sounds. No wheezing or rales.  Abdominal:     General: There is no distension.     Palpations: Abdomen is soft.     Tenderness: There is abdominal tenderness. There is no guarding.     Comments: Patient surgical site is well-appearing.  There is no erythema.  No drainage.  No fluid collection palpated beneath the surface of the surgical site. Mild epigastric tenderness palpation without guarding or rebound.  Musculoskeletal: Normal range of motion.        General: No deformity.  Lymphadenopathy:     Cervical: No cervical adenopathy.  Skin:    General: Skin is warm and dry.     Findings: No erythema or rash.  Neurological:     Mental Status: She is alert.     Comments: Cranial nerves grossly intact. Patient moves extremities symmetrically and with good coordination.  Psychiatric:        Behavior: Behavior normal.        Thought Content: Thought content normal.        Judgment: Judgment normal.      ED Treatments / Results  Labs (all labs ordered are listed, but only abnormal results are displayed) Labs Reviewed  CBC - Abnormal; Notable for the following components:      Result Value   WBC 11.6 (*)    All other components within normal limits  URINALYSIS, ROUTINE W REFLEX MICROSCOPIC - Abnormal; Notable for the following components:   Color, Urine  STRAW (*)    Hgb urine dipstick SMALL (*)    All other components within normal limits  GROUP A STREP BY PCR  LIPASE, BLOOD  COMPREHENSIVE METABOLIC PANEL  I-STAT BETA HCG BLOOD, ED ([facility], [facility], [facility] ONLY)    EKG None  Radiology Dg Chest 2 View  Result Date: 06/20/2018 CLINICAL DATA:  Cough and fever. EXAM: CHEST - 2 VIEW COMPARISON:  09/23/2013 FINDINGS: Mild biapical pleuroparenchymal scarring, unchanged.The cardiomediastinal contours are normal. The lungs are clear. Pulmonary vasculature is normal. No consolidation, pleural effusion, or pneumothorax. No acute osseous abnormalities are seen. IMPRESSION: No acute chest findings. Electronically Signed   By: Keith Rake M.D.   On: 06/20/2018 20:13    Procedures Procedures (including critical care time)  Medications Ordered in ED Medications  sodium chloride flush (NS) 0.9 % injection 3 mL (has no administration in time range)     Initial Impression / Assessment and Plan / ED Course  I have reviewed the triage vital signs and the nursing notes.  Pertinent labs & imaging results that were available during my care of the patient were reviewed by me and considered in my medical decision making (see chart for details).        Patient is nontoxic-appearing, afebrile, hemodynamically stable, and in no acute distress.  Patient has a well-appearing surgical site.  Patient has normal pulmonary exam, and no evidence of a pneumonia, and a normal chest x-ray.  Patient has normal labs other than slight leukocytosis, likely reactive in the setting of viral URI.  Doubt pulmonary ballismus the cause of patient symptoms, as she has normal vital signs, no chest pain, shortness of breath, and symptoms are highly suggestive of URI is the most likely diagnosis.  Patient did have recent surgery, but has no other major risk factors for PE.  Patient did have some pain superior to her surgical site.  There appears to be no evidence of fluid collection  beneath it, erythema, or drainage.  I suspect this is due to coughing.  I did encourage patient to follow-up with her surgeon on Monday if she appears to have any further discomfort.  Patient was given return precautions for any shortness of breath, chest pain, dizziness, lightheadedness, erythema or pain from the incision site.  Patient is in understanding and agrees with the plan of care.  This is a shared visit with Dr. Deno Etienne. Patient was independently evaluated by this attending physician. Attending physician consulted in evaluation and discharge management.  Final Clinical Impressions(s) / ED Diagnoses   Final diagnoses:  Viral upper respiratory tract infection  Cough    ED Discharge Orders         Ordered    fluticasone (FLONASE) 50 MCG/ACT nasal spray  Daily     06/20/18 2209    dextromethorphan-guaiFENesin (MUCINEX DM) 30-600 MG 12hr tablet  2 times daily     06/20/18 2209           Tamala Julian 06/21/18 0032    Deno Etienne, DO 06/21/18 1506

## 2018-06-20 NOTE — Discharge Instructions (Addendum)
Please place one spray of Flonase in each nostril in the morning.   Por favor, coloque un spray de Flonase en cada fosa nasal por la maana.  Please take one Mucinex pill twice a day for 7 days.   Por favor, tome una pldora de [employer] veces [place] [address].  Return to the emergency department for any difficulty breathing, chest pain, or worsening abdominal pain.  Regrese al servicio de urgencias para cualquier dificultad para respirar, Tourist information centre manager o empeoramiento del dolor abdominal.

## 2018-06-22 ENCOUNTER — Telehealth: Payer: Self-pay | Admitting: *Deleted

## 2018-06-22 NOTE — Telephone Encounter (Signed)
-----   Message from Antony Blackbird, MD sent at 06/21/2018 10:25 PM EDT ----- Reference ranges are for 8am and 4 pm. Test was done at 9:09 am so it would fall within normal close to the 8 am range

## 2018-06-22 NOTE — Telephone Encounter (Signed)
Medical Assistant used [employer] to contact patient.  Interpreter Name: Anne Hahn Interpreter #: [phone] Patient is aware of level falling in the normal range which that was resulted between 8am to 4pm. No further questions

## 2018-06-29 ENCOUNTER — Ambulatory Visit (HOSPITAL_COMMUNITY)
Admission: RE | Admit: 2018-06-29 | Discharge: 2018-06-29 | Disposition: A | Discharge status: Home / Self Care | Payer: Medicaid Other | Attending: Gastroenterology | Admitting: Gastroenterology

## 2018-06-29 ENCOUNTER — Encounter (HOSPITAL_COMMUNITY)
Admission: RE | Disposition: A | Discharge status: Home / Self Care | Payer: Self-pay | Source: Home / Self Care | Attending: Gastroenterology

## 2018-06-29 DIAGNOSIS — R1310 Dysphagia, unspecified: Secondary | ICD-10-CM | POA: Diagnosis not present

## 2018-06-29 HISTORY — PX: ESOPHAGEAL MANOMETRY: SHX5429

## 2018-06-29 SURGERY — MANOMETRY, ESOPHAGUS

## 2018-06-29 MED ORDER — LIDOCAINE VISCOUS HCL 2 % MT SOLN
OROMUCOSAL | Status: AC
  Filled 2018-06-29: qty 15

## 2018-06-29 SURGICAL SUPPLY — 2 items
FACESHIELD LNG OPTICON STERILE (SAFETY) IMPLANT
GLOVE BIO SURGEON STRL SZ8 (GLOVE) ×6 IMPLANT

## 2018-06-29 NOTE — Progress Notes (Signed)
Esophageal manometry done per protocol.  Patient tolerated well.  Report sent to Dr Kavitha Nandigam.   

## 2018-06-30 ENCOUNTER — Encounter (HOSPITAL_COMMUNITY): Payer: Self-pay | Admitting: Gastroenterology

## 2018-07-03 ENCOUNTER — Encounter: Payer: Self-pay | Admitting: Gastroenterology

## 2018-07-09 ENCOUNTER — Encounter: Payer: Self-pay | Admitting: Family Medicine

## 2018-07-09 ENCOUNTER — Ambulatory Visit: Payer: Medicaid Other | Attending: Family Medicine | Admitting: Family Medicine

## 2018-07-09 ENCOUNTER — Other Ambulatory Visit: Payer: Self-pay

## 2018-07-09 DIAGNOSIS — M545 Low back pain, unspecified: Secondary | ICD-10-CM

## 2018-07-09 NOTE — Progress Notes (Signed)
Established Patient Office Visit  Subjective:  Patient ID: Bonnie Conrad, female    DOB: [birth year]  Age: 47 y.o. MRN: [id number]   Due to a language barrier, patient was contacted through [facility] interpreter services.  Interpreter was [place] with ID number [id number]   CC: back pain  HPI Rajah Lamba, 48 year old female, was contacted for telehealth visit as patient had called the day previously to schedule an appointment for back pain.  Through the interpreter, patient reported that she is having severe pain that originates at her tailbone and radiates around her abdomen to the site of her recent surgery.  Patient states that she had an appointment yesterday to see her surgeon in follow-up however this appointment was canceled and rescheduled until April.  Patient has not contacted her surgeon regarding her current pain.  Past Medical History:  Diagnosis Date  . Abdominal hernia   . Anemia   . Anxiety   . Arthritis   . Blood transfusion without reported diagnosis   . Depression    hosp during pregnancy at [facility]  . Hernia of abdominal cavity 2009  . Ovarian cyst     Past Surgical History:  Procedure Laterality Date  . COLONOSCOPY WITH ESOPHAGOGASTRODUODENOSCOPY (EGD)    . DILATION AND CURETTAGE OF UTERUS    . EPIGASTRIC HERNIA REPAIR N/A 06/05/2018   Procedure: OPEN REPAIR OF INCARCERATED EPIGASTRIC HERNIA WITH MESH;  Surgeon: Clovis Riley, MD;  Location: [place];  Service: General;  Laterality: N/A;  . ESOPHAGEAL MANOMETRY N/A 06/29/2018   Procedure: ESOPHAGEAL MANOMETRY (EM);  Surgeon: Mauri Pole, MD;  [facility];  Service: Endoscopy;  Laterality: N/A;  . TUBAL LIGATION  07/25/2011   Procedure: POST PARTUM TUBAL LIGATION;  Surgeon: Mora Bellman, MD;  Location: [facility];  Service: Gynecology;  Laterality: Bilateral;    Family History  Problem Relation Age of Onset  . Hypertension Mother   . Anesthesia problems Neg Hx     . Hypotension Neg Hx   . Malignant hyperthermia Neg Hx   . Pseudochol deficiency Neg Hx   . Colon cancer Neg Hx   . Esophageal cancer Neg Hx   . Inflammatory bowel disease Neg Hx   . Liver disease Neg Hx   . Pancreatic cancer Neg Hx   . Rectal cancer Neg Hx   . Stomach cancer Neg Hx   . Colon polyps Neg Hx     Social History   Socioeconomic History  . Marital status: Married    Spouse name: Not on file  . Number of children: Not on file  . Years of education: Not on file  . Highest education level: Not on file  Occupational History  . Not on file  Social Needs  . Financial resource strain: Not on file  . Food insecurity:    Worry: Not on file    Inability: Not on file  . Transportation needs:    Medical: Not on file    Non-medical: Not on file  Tobacco Use  . Smoking status: Current Some Day Smoker  . Smokeless tobacco: Never Used  . Tobacco comment: a few months ago  Substance and Sexual Activity  . Alcohol use: No  . Drug use: No  . Sexual activity: Yes    Birth control/protection: None    Comment: tubal  Lifestyle  . Physical activity:    Days per week: Not on file    Minutes per session: Not on file  .  Stress: Not on file  Relationships  . Social connections:    Talks on phone: Not on file    Gets together: Not on file    Attends religious service: Not on file    Active member of club or organization: Not on file    Attends meetings of clubs or organizations: Not on file    Relationship status: Not on file  . Intimate partner violence:    Fear of current or ex partner: Not on file    Emotionally abused: Not on file    Physically abused: Not on file    Forced sexual activity: Not on file  Other Topics Concern  . Not on file  Social History Narrative  . Not on file    Outpatient Medications Prior to Visit  Medication Sig Dispense Refill  . dicyclomine (BENTYL) 10 MG capsule Take 1 capsule (10 mg total) by mouth 4 (four) times daily -  before  meals and at bedtime. To relieve abdominal pain 120 capsule 3  . ferrous gluconate (FERGON) 324 MG tablet Take 1 tablet (324 mg total) by mouth daily with breakfast for 30 days. 30 tablet 2  . fluticasone (FLONASE) 50 MCG/ACT nasal spray Place 1 spray into both nostrils daily. 16 g 0  . omeprazole (PRILOSEC) 40 MG capsule Take 1 capsule (40 mg total) by mouth 2 (two) times daily. 60 capsule 2  . ondansetron (ZOFRAN ODT) 4 MG disintegrating tablet Take 1 tablet (4 mg total) by mouth every 8 (eight) hours as needed for nausea or vomiting. 20 tablet 0  . traMADol (ULTRAM) 50 MG tablet Take 1 tablet (50 mg total) by mouth every 6 (six) hours as needed. (Patient taking differently: Take 50 mg by mouth every 6 (six) hours as needed for moderate pain. ) 20 tablet 0   No facility-administered medications prior to visit.     Allergies  Allergen Reactions  . Penicillins Swelling    Did it involve swelling of the face/tongue/throat, SOB, or low BP? Yes Did it involve sudden or severe rash/hives, skin peeling, or any reaction on the inside of your mouth or nose? No Did you need to seek medical attention at a hospital or doctor's office? No When did it last happen?1 or 2 years ago If all above answers are "NO", may proceed with cephalosporin use.   Marland Kitchen Morphine And Related   . Vicodin [Hydrocodone-Acetaminophen] Hives and Itching    ROS Review of Systems    Objective:    Physical Exam  There were no vitals taken for this visit. Wt Readings from Last 3 Encounters:  06/20/18 111 lb (50.3 kg)  06/05/18 111 lb 14.4 oz (50.8 kg)  06/03/18 111 lb 14.4 oz (50.8 kg)   No physical examination was performed and no vital signs were taken/obtained as visit was done as a phone encounter Health Maintenance Due  Topic Date Due  . PAP SMEAR-Modifier  03/05/2014    There are no preventive care reminders to display for this patient.  Lab Results  Component Value Date   TSH 1.80 03/05/2018   Lab  Results  Component Value Date   WBC 11.6 (H) 06/20/2018   HGB 13.5 06/20/2018   HCT 42.4 06/20/2018   MCV 95.7 06/20/2018   PLT 284 06/20/2018   Lab Results  Component Value Date   NA 139 06/20/2018   K 3.5 06/20/2018   CO2 26 06/20/2018   GLUCOSE 90 06/20/2018   BUN 9 06/20/2018   CREATININE  0.56 06/20/2018   BILITOT 0.4 06/20/2018   ALKPHOS 79 06/20/2018   AST 17 06/20/2018   ALT 14 06/20/2018   PROT 7.9 06/20/2018   ALBUMIN 4.7 06/20/2018   CALCIUM 9.4 06/20/2018   ANIONGAP 6 06/20/2018   GFR 99.19 03/05/2018   No results found for: CHOL No results found for: HDL No results found for: LDLCALC No results found for: TRIG No results found for: CHOLHDL No results found for: HGBA1C    Assessment & Plan:   1. Acute midline low back pain without sciatica As patient is status post surgery and now with complaint of acute midline low back pain that radiates to her surgical site, patient was advised to contact her surgeon's office for further advice regarding follow-up.  If she is unable to contact her surgeon's office, patient was advised to go to urgent care for further evaluation of her current pain. (Unfortunately, connection was lost with the interpreter service after this information has been conveyed to the patient)   Follow-up: Return if symptoms worsen or fail to improve, for Call your surgeon's office regarding follow-up.   Antony Blackbird, MD

## 2018-08-11 ENCOUNTER — Ambulatory Visit (INDEPENDENT_AMBULATORY_CARE_PROVIDER_SITE_OTHER): Payer: Medicaid Other | Admitting: Gastroenterology

## 2018-08-11 ENCOUNTER — Other Ambulatory Visit: Payer: Self-pay

## 2018-08-11 DIAGNOSIS — G8929 Other chronic pain: Secondary | ICD-10-CM

## 2018-08-11 DIAGNOSIS — Z9889 Other specified postprocedural states: Secondary | ICD-10-CM | POA: Diagnosis not present

## 2018-08-11 DIAGNOSIS — K449 Diaphragmatic hernia without obstruction or gangrene: Secondary | ICD-10-CM | POA: Diagnosis not present

## 2018-08-11 DIAGNOSIS — D509 Iron deficiency anemia, unspecified: Secondary | ICD-10-CM

## 2018-08-11 DIAGNOSIS — G8918 Other acute postprocedural pain: Secondary | ICD-10-CM | POA: Diagnosis not present

## 2018-08-11 DIAGNOSIS — R1084 Generalized abdominal pain: Secondary | ICD-10-CM | POA: Diagnosis not present

## 2018-08-11 DIAGNOSIS — Z8719 Personal history of other diseases of the digestive system: Secondary | ICD-10-CM | POA: Diagnosis not present

## 2018-08-11 DIAGNOSIS — R1310 Dysphagia, unspecified: Secondary | ICD-10-CM

## 2018-08-11 DIAGNOSIS — K219 Gastro-esophageal reflux disease without esophagitis: Secondary | ICD-10-CM

## 2018-08-11 MED ORDER — FERROUS GLUCONATE 324 (38 FE) MG PO TABS: 324.0000 mg | ORAL_TABLET | Freq: Every day | ORAL | 6 refills | Status: DC

## 2018-08-11 MED ORDER — OMEPRAZOLE 40 MG PO CPDR: 40.0000 mg | DELAYED_RELEASE_CAPSULE | Freq: Two times a day (BID) | ORAL | 6 refills | Status: DC

## 2018-08-11 NOTE — Patient Instructions (Signed)
If you are age 46 or older, your body mass index should be between 23-30. Your There is no height or weight on file to calculate BMI. If this is out of the aforementioned range listed, please consider follow up with your Primary Care Provider.  If you are age 45 or younger, your body mass index should be between 19-25. Your There is no height or weight on file to calculate BMI. If this is out of the aformentioned range listed, please consider follow up with your Primary Care Provider.    Your provider has requested that you go to the basement level for lab work in 2-3 weeks. Press "B" on the elevator. The lab is located at the first door on the left as you exit the elevator.  Refills for your iron supplements & Omeprazole have been sent to your pharmacy.   We will contact you to schedule a follow up appointment.   Thank you for choosing me and [facility].  Dr. Rush Landmark

## 2018-08-11 NOTE — Progress Notes (Signed)
[place] VISIT   Primary Care Provider Antony Blackbird, MD [place] [id number] [phone]  Patient Profile: Bonnie Conrad is a 48 y.o. female with a pmh significant for Multiple Gestations, Abdominal Hernia, Ovarian Cysts, hx of MDD/Anxiety, GERD, colon polyps (SSPs), ventral diastases/hernia (s/p recent repair), chronic Abdominal Pain.  The patient presents to the [facility] for an evaluation and management of problem(s) noted below:  Problem List 1. Iron deficiency anemia, unspecified iron deficiency anemia type   2. Chronic generalized abdominal pain   3. Gastroesophageal reflux disease, esophagitis presence not specified   4. Hiatal hernia   5. History of hernia repair   6. Pain at surgical site   7. Dysphagia, unspecified type     History of Present Illness: Please see initial consultation note for full details of HPI.   I connected with  Bonnie Conrad. I verified that I was speaking with the correct person using two identifiers through the use of interpreter services. Due to the COVID-19 Pandemic, this service was provided via telemedicine using audio only. Interactive audio and video telecommunications were attempted between this provider and patient, however failed, due to patient having technical difficulties and inability to access this type of technique with interpreter services as well per the patient.  Thus to provide timely and excellent care, we continued and completed visit with audio only. The patient was located at home. The provider was located in the office. The patient did consent to this visit and is aware of charges through their insurance as well as the limitations of evaluation and management by telemedicine. Other persons participating in this telemedicine service were interpreter services.   Interval History  The patient since I last saw her has undergone  abdominal hernia surgery.  She has done well overall and is still having some issues but is significantly better than previous surgical intervention.  She states she still having some difficulty and pain around her surgical site with one area that is harder and more lumpy around an incision.  However she is 75 to 80% better than where she was and is very happy about the surgery result overall.  She is having GERD symptoms still but is not taking her PPI at appropriate time.  She is not describing any significant dysphagia at this point in time.  She had undergone before the COVID-19 pandemic and esophageal manometry as noted below.  Otherwise she is happy that things are going well and she is run out of iron.    GI Review of Systems Positive as above Negative for bloating, dysphagia, odynophagia, melena, hematochezia  Review of Systems General: Denies fevers/chills Cardiovascular: Denies current chest pain/palpitations Pulmonary: Denies shortness of breath Gastroenterological: See HPI Genitourinary: Denies darkened urine Hematological: Denies easy bruising Dermatological: Denies jaundice Psychological: Mood is stable   Medications Current Outpatient Medications  Medication Sig Dispense Refill   dicyclomine (BENTYL) 10 MG capsule Take 1 capsule (10 mg total) by mouth 4 (four) times daily -  before meals and at bedtime. To relieve abdominal pain 120 capsule 3   ferrous gluconate (FERGON) 324 MG tablet Take 1 tablet (324 mg total) by mouth daily with breakfast for 30 days. 30 tablet 6   fluticasone (FLONASE) 50 MCG/ACT nasal spray Place 1 spray into both nostrils daily. 16 g 0   omeprazole (PRILOSEC) 40 MG capsule Take 1 capsule (40 mg total) by mouth 2 (two) times daily. 60 capsule 6   ondansetron (  ZOFRAN ODT) 4 MG disintegrating tablet Take 1 tablet (4 mg total) by mouth every 8 (eight) hours as needed for nausea or vomiting. 20 tablet 0   traMADol (ULTRAM) 50 MG tablet Take 1 tablet  (50 mg total) by mouth every 6 (six) hours as needed. (Patient taking differently: Take 50 mg by mouth every 6 (six) hours as needed for moderate pain. ) 20 tablet 0   No current facility-administered medications for this visit.     Allergies Allergies  Allergen Reactions   Penicillins Swelling    Did it involve swelling of the face/tongue/throat, SOB, or low BP? Yes Did it involve sudden or severe rash/hives, skin peeling, or any reaction on the inside of your mouth or nose? No Did you need to seek medical attention at a hospital or doctor's office? No When did it last happen?1 or 2 years ago If all above answers are "NO", may proceed with cephalosporin use.    Morphine And Related    Vicodin [Hydrocodone-Acetaminophen] Hives and Itching    Histories Past Medical History:  Diagnosis Date   Abdominal hernia    Anemia    Anxiety    Arthritis    Blood transfusion without reported diagnosis    Depression    [facility]   Hernia of abdominal cavity 2009   Ovarian cyst    Past Surgical History:  Procedure Laterality Date   COLONOSCOPY WITH ESOPHAGOGASTRODUODENOSCOPY (EGD)     DILATION AND CURETTAGE OF UTERUS     EPIGASTRIC HERNIA REPAIR N/A 06/05/2018   Procedure: OPEN REPAIR OF INCARCERATED EPIGASTRIC HERNIA WITH MESH;  Surgeon: Clovis Riley, MD;  Location: [facility];  Service: General;  Laterality: N/A;   ESOPHAGEAL MANOMETRY N/A 06/29/2018   Procedure: ESOPHAGEAL MANOMETRY (EM);  Surgeon: Mauri Pole, MD;  [facility]: [facility];  Service: Endoscopy;  Laterality: N/A;   TUBAL LIGATION  07/25/2011   Procedure: POST PARTUM TUBAL LIGATION;  Surgeon: Mora Bellman, MD;  Location: [facility];  Service: Gynecology;  Laterality: Bilateral;   Social History   Socioeconomic History   Marital status: Married    Spouse name: Not on file   Number of children: Not on file   Years of education: Not on file   Highest education  level: Not on file  Occupational History   Not on file  Social Needs   Financial resource strain: Not on file   Food insecurity:    Worry: Not on file    Inability: Not on file   Transportation needs:    Medical: Not on file    Non-medical: Not on file  Tobacco Use   Smoking status: Current Some Day Smoker   Smokeless tobacco: Never Used   Tobacco comment: a few months ago  Substance and Sexual Activity   Alcohol use: No   Drug use: No   Sexual activity: Yes    Birth control/protection: None    Comment: tubal  Lifestyle   Physical activity:    Days per week: Not on file    Minutes per session: Not on file   Stress: Not on file  Relationships   Social connections:    Talks on phone: Not on file    Gets together: Not on file    Attends religious service: Not on file    Active member of club or organization: Not on file    Attends meetings of clubs or organizations: Not on file    Relationship status: Not  on file   Intimate partner violence:    Fear of current or ex partner: Not on file    Emotionally abused: Not on file    Physically abused: Not on file    Forced sexual activity: Not on file  Other Topics Concern   Not on file  Social History Narrative   Not on file   Family History  Problem Relation Age of Onset   Hypertension Mother    Anesthesia problems Neg Hx    Hypotension Neg Hx    Malignant hyperthermia Neg Hx    Pseudochol deficiency Neg Hx    Colon cancer Neg Hx    Esophageal cancer Neg Hx    Inflammatory bowel disease Neg Hx    Liver disease Neg Hx    Pancreatic cancer Neg Hx    Rectal cancer Neg Hx    Stomach cancer Neg Hx    Colon polyps Neg Hx    I have reviewed her medical, social, and family history in detail and updated the electronic medical record as necessary.    PHYSICAL EXAMINATION  Telehealth visit   REVIEW OF DATA  I reviewed the following data at the time of this encounter:  GI Procedures and  Studies  March 2020 esophageal manometry   Laboratory Studies  Reviewed in epic  Imaging Studies  No new studies to review   ASSESSMENT  Ms. Fraser is a 48 y.o. female  with a pmh significant for Multiple Gestations, Abdominal Hernia, Ovarian Cysts, hx of MDD/Anxiety, GERD, colon polyps (SSPs), ventral diastases/hernia (s/p recent repair), chronic Abdominal Pain.  The patient is seen today for evaluation and management of:  1. Iron deficiency anemia, unspecified iron deficiency anemia type   2. Chronic generalized abdominal pain   3. Gastroesophageal reflux disease, esophagitis presence not specified   4. Hiatal hernia   5. History of hernia repair   6. Pain at surgical site   7. Dysphagia, unspecified type    Overall, the patient has done very well since her surgical intervention and abdominal hernia surgery.  I am very glad that she ended up moving forward with getting that completed.  From a GI perspective her pain is improved and I think we are moving in the direction.  She still having GERD symptoms and does have a hiatal hernia.  She also has issues in regards to taking her medicines at the correct time until we have worked to rectify that make sure she is taking before meals and see how she does.  She not having any dysphagia currently either and I wonder how much of her symptoms were previously a result of her's hernia issues.  With that being said her esophageal manometry did suggest the possibility of ineffective esophageal motility but without E GJ outflow obstruction.  She is not having symptoms currently so we will monitor her closely if we need to repeat her manometry we can consider that and make sure that she has an interpreter service with her if necessary.  At this point we will monitor her closely and make sure she has improved her iron indices.  We will re-fill her PPI as well as iron and plan to follow-up in 6 to 8 weeks.  She will let us know if things change  otherwise.  She can talk with her surgeon soon she has an upcoming appointment with in regards to her surgical site discomfort.   All patient questions were answered, to the best of my ability, and the  patient agrees to the aforementioned plan of action with follow-up as indicated.   PLAN  Laboratories as outlined below to evaluate if patient still has a persistent iron insufficiency PPI refill twice daily PPI education in regards to timing of dosing Iron to be refilled and continued based on iron indices Hold on repeat manometry Continue fiber supplementation Preparation H or of other over-the-counter medications for hemorrhoids Depending on symptoms in the future will consider solid-phase gastric emptying study If she continues to have iron deficiency on appropriate iron supplementation she will need IV iron and consideration of a capsule endoscopy    Orders Placed This Encounter  Procedures   CBC   IBC + Ferritin    New Prescriptions   No medications on file   Modified Medications   Modified Medication Previous Medication   FERROUS GLUCONATE (FERGON) 324 MG TABLET ferrous gluconate (FERGON) 324 MG tablet      Take 1 tablet (324 mg total) by mouth daily with breakfast for 30 days.    Take 1 tablet (324 mg total) by mouth daily with breakfast for 30 days.   OMEPRAZOLE (PRILOSEC) 40 MG CAPSULE omeprazole (PRILOSEC) 40 MG capsule      Take 1 capsule (40 mg total) by mouth 2 (two) times daily.    Take 1 capsule (40 mg total) by mouth 2 (two) times daily.    Planned Follow Up: Return in about 2 weeks (around 08/25/2018).   Justice Britain, MD [facility] Advanced Endoscopy Office # [phone]

## 2018-08-12 ENCOUNTER — Telehealth: Payer: Self-pay

## 2018-08-12 NOTE — Telephone Encounter (Signed)
Left message on voicemail in medical records at [facility] requesting most recent records from hernia repair surgery.

## 2018-08-12 NOTE — Telephone Encounter (Signed)
-----   Message from Irving Copas., MD sent at 08/06/2018  9:45 AM EDT ----- Regarding: Next weeks patient Bonnie Conrad or Covering CMA, This patient has a clinic visit with me next week. I wanted to ensure we get any recent records from [facility] since she just had a hernia repair done recently with them and was to have follow up after. Thanks. GM.

## 2018-08-15 DIAGNOSIS — Z9889 Other specified postprocedural states: Secondary | ICD-10-CM

## 2018-08-15 DIAGNOSIS — Z8719 Personal history of other diseases of the digestive system: Secondary | ICD-10-CM | POA: Insufficient documentation

## 2018-08-15 DIAGNOSIS — K449 Diaphragmatic hernia without obstruction or gangrene: Secondary | ICD-10-CM | POA: Insufficient documentation

## 2018-08-15 DIAGNOSIS — G8918 Other acute postprocedural pain: Secondary | ICD-10-CM | POA: Insufficient documentation

## 2018-08-19 ENCOUNTER — Other Ambulatory Visit: Payer: Self-pay

## 2018-08-19 ENCOUNTER — Ambulatory Visit: Payer: Medicaid Other | Attending: Surgery | Admitting: Physical Therapy

## 2018-08-19 DIAGNOSIS — M6281 Muscle weakness (generalized): Secondary | ICD-10-CM | POA: Insufficient documentation

## 2018-08-19 DIAGNOSIS — G8929 Other chronic pain: Secondary | ICD-10-CM | POA: Diagnosis not present

## 2018-08-19 DIAGNOSIS — M545 Low back pain, unspecified: Secondary | ICD-10-CM

## 2018-08-19 NOTE — Therapy (Signed)
[facility] [address], [place], [place], [address] Phone: [phone]   Fax:  [phone]  Physical Therapy Evaluation  Patient Details  Name: Bonnie Conrad MRN: [id number] Date of Birth: [birth year] Referring Provider (PT): Clovis Riley, MD   Encounter Date: 08/19/2018  PT End of Session - 08/19/18 1808    Visit Number  1    Date for PT Re-Evaluation  10/16/18    PT Start Time  1600    PT Stop Time  1640    PT Time Calculation (min)  40 min    Activity Tolerance  Patient limited by pain    Behavior During Therapy  [facility] for tasks assessed/performed       Past Medical History:  Diagnosis Date  . Abdominal hernia   . Anemia   . Anxiety   . Arthritis   . Blood transfusion without reported diagnosis   . Depression    hosp during pregnancy at [facility]  . Hernia of abdominal cavity 2009  . Ovarian cyst     Past Surgical History:  Procedure Laterality Date  . COLONOSCOPY WITH ESOPHAGOGASTRODUODENOSCOPY (EGD)    . DILATION AND CURETTAGE OF UTERUS    . EPIGASTRIC HERNIA REPAIR N/A 06/05/2018   Procedure: OPEN REPAIR OF INCARCERATED EPIGASTRIC HERNIA WITH MESH;  Surgeon: Clovis Riley, MD;  Location: [place];  Service: General;  Laterality: N/A;  . ESOPHAGEAL MANOMETRY N/A 06/29/2018   Procedure: ESOPHAGEAL MANOMETRY (EM);  Surgeon: Mauri Pole, MD;  [facility];  Service: Endoscopy;  Laterality: N/A;  . TUBAL LIGATION  07/25/2011   Procedure: POST PARTUM TUBAL LIGATION;  Surgeon: Mora Bellman, MD;  Location: [facility];  Service: Gynecology;  Laterality: Bilateral;    There were no vitals filed for this visit.   Subjective Assessment - 08/19/18 1608    Subjective  Pt had hernia surgery in February and has had more pain since then.    Patient is accompained by:  Interpreter    Limitations  Standing;Walking;Sitting;Lifting    How long can you sit comfortably?  unable  without constantly readjusting    How long can you stand comfortably?  10-15 minutes    How long can you walk comfortably?  15 minutes    Patient Stated Goals  be able to stand and walk for at least 30-60 minutes    Currently in Pain?  Yes    Pain Score  8     Pain Location  Back    Pain Orientation  Right;Left    Pain Descriptors / Indicators  Sharp   torture in the back   Pain Type  Chronic pain    Pain Onset  More than a month ago    Pain Frequency  Intermittent    Aggravating Factors   standing and walking    Pain Relieving Factors  lying down, heat    Effect of Pain on Daily Activities  bending and lifting like laundry         [facility] PT Assessment - 08/20/18 0001      Assessment   Medical Diagnosis  M62.08 (ICD-10-CM) - Separation of muscle (nontraumatic), other site    Referring Provider (PT)  Clovis Riley, MD    Onset Date/Surgical Date  50/27/74   umbilical hernia surgery   Prior Therapy  No      Precautions   Precautions  None      Restrictions   Weight Bearing Restrictions  No      Balance Screen   Has the patient fallen in the past 6 months  No      [place] residence    Living Arrangements  Children   3 girls     Prior Function   Level of Independence  Independent      Cognition   Overall Cognitive Status  Within Functional Limits for tasks assessed      Posture/Postural Control   Posture/Postural Control  Postural limitations    Postural Limitations  Rounded Shoulders      AROM   Overall AROM Comments  trunk movements very guarded with grimacing so did not test AROM      PROM   Overall PROM Comments  left hip ER 50% limited       Strength   Overall Strength Comments  bilat hip WNL; core strength 3/5; tested LE in supine due to pain       Flexibility   Soft Tissue Assessment /Muscle Length  yes    Hamstrings  45 deg hip flexion with straight leg bilateral    Piriformis  50% limited left       Palpation   Palpation comment  TTP along midline of abdomen, diastasis 2 fingers width      Special Tests    Special Tests  Lumbar    Lumbar Tests  Straight Leg Raise      Straight Leg Raise   Findings  Negative    Comment  tight hamstrings      Transfers   Comments  sit to stand grimacing with pain      Ambulation/Gait   Gait Pattern  Decreased stride length                Objective measurements completed on examination: See above findings.                PT Short Term Goals - 08/20/18 1846      PT SHORT TERM GOAL #1   Title  pt will be able to do LE MMT in sitting due to improved core strength    Time  4    Period  Weeks    Status  New    Target Date  09/16/18      PT SHORT TERM GOAL #2   Title  pt will report 25% less pain    Time  4    Period  Weeks    Status  New    Target Date  09/16/18        PT Long Term Goals - 08/20/18 1848      PT LONG TERM GOAL #1   Title  Pt will report at least 50% less pain on a typical day    Baseline  8/10 using NPRS    Time  8    Period  Weeks    Status  New    Target Date  10/16/18      PT LONG TERM GOAL #2   Title  Pt will demonstrate ability to engage abdominal muscles and in order to create tension throughout abdomen and maintain during rolling in bed    Time  8    Period  Weeks    Status  New    Target Date  10/16/18      PT LONG TERM GOAL #3   Title  Pt will be ind with advanced HEP in order to maintain strength  for improved function    Time  8    Period  Weeks    Status  New    Target Date  10/16/18      PT LONG TERM GOAL #4   Title  Pt will be able to stand and walk for at least 30 minutes at a time so she can prepare meals and clean her home    Baseline  she can stand only 10-15 minutes    Time  8    Period  Weeks    Status  New    Target Date  10/16/18             Plan - 08/20/18 1853    Clinical Impression Statement  Pt presents to clinic due to increased back and  abdominal pain that has not gone away since her surgery in February.  Pt presents to clinic with the following impairments as mentioned above including decreased hamstring flexibility and hip ROM.  She has LE weakness in sitting due to pain, but appears normal in supine.  Core strength is 3/5 MMT and pt has diastasis of rectus abdominus.  Pt has decreased function with her usual household activities and chores due to pain and impairments.  she will benefit from physical therapy to work on muscle activation, coordination, and strength as well as address other impairmest as mentioned.  Patient needs these services to return to normal functional activities    Personal Factors and Comorbidities  Comorbidity 3+    Comorbidities  hysterectomy, hernia repair, chronic pain    Examination-Activity Limitations  Squat;Lift;Bed Mobility;Bend;Stand;Locomotion Level;Dressing;Sit    Examination-Participation Restrictions  Cleaning;Meal Prep    Clinical Decision Making  Moderate    Rehab Potential  Excellent    PT Frequency  2x / week   up to 3x in first week   PT Duration  8 weeks    PT Treatment/Interventions  ADLs/Self Care Home Management;Cryotherapy;Electrical Stimulation;Biofeedback;Moist Heat;Ultrasound;Stair training;Therapeutic activities;Therapeutic exercise;Gait training;Neuromuscular re-education;Patient/family education;Manual techniques;Passive range of motion;Dry needling;Taping    PT Next Visit Plan  transversus ab activation in supine, maintain with roll, abdominal fascial release, diaphragm breathing    PT Home Exercise Plan  not given yet, but discussed abdominal massage    Consulted and Agree with Plan of Care  Patient       Patient will benefit from skilled therapeutic intervention in order to improve the following deficits and impairments:  Pain, Increased fascial restricitons, Decreased coordination, Decreased range of motion, Decreased strength, Difficulty walking, Impaired flexibility,  Postural dysfunction  Visit Diagnosis: Chronic bilateral low back pain without sciatica  Muscle weakness (generalized)     Problem List Patient Active Problem List   Diagnosis Date Noted  . Hiatal hernia 08/15/2018  . History of hernia repair 08/15/2018  . Pain at surgical site 08/15/2018  . Iron deficiency anemia 06/02/2018  . Dysphagia 06/02/2018  . History of colonic polyps 06/02/2018  . Ventral hernia without obstruction or gangrene 06/02/2018  . Abnormal CT of the abdomen 03/09/2018  . Non-intractable vomiting 03/09/2018  . Chronic generalized abdominal pain 03/09/2018  . Gastroesophageal reflux disease 03/09/2018  . Rectal bleeding 03/09/2018  . Hemorrhoids 03/09/2018  . Major depressive disorder, recurrent episode, severe, with psychotic behavior ([place]) 04/03/2011  . Severe major depression with psychotic features ([place]) 03/06/2011  . Hernia of abdominal cavity 03/06/2011    Jule Ser, PT 08/20/2018, 7:09 PM  [place] [facility] [address], [place], [place], [address]  Phone: [phone]   Fax:  [phone]  Name: Bonnie Conrad MRN: [id number] Date of Birth: 11/15/1970

## 2018-09-01 ENCOUNTER — Ambulatory Visit: Payer: Medicaid Other | Admitting: Gastroenterology

## 2018-09-02 ENCOUNTER — Ambulatory Visit: Payer: Medicaid Other | Admitting: Physical Therapy

## 2018-09-02 ENCOUNTER — Other Ambulatory Visit: Payer: Self-pay

## 2018-09-02 DIAGNOSIS — M6281 Muscle weakness (generalized): Secondary | ICD-10-CM | POA: Diagnosis not present

## 2018-09-02 DIAGNOSIS — G8929 Other chronic pain: Secondary | ICD-10-CM

## 2018-09-02 DIAGNOSIS — M545 Low back pain: Secondary | ICD-10-CM | POA: Diagnosis not present

## 2018-09-02 NOTE — Patient Instructions (Signed)
Access Code: [email or web address]  URL: [email or web address]  Date: 09/02/2018  Prepared by: Jari Favre   Exercises  Sidelying Transversus Abdominis Bracing - 10 reps - 1 sets - 5 sec hold - 1x daily - 7x weekly  Clamshell - 10 reps - 1 sets - 1x daily - 7x weekly  Seated Long Arc Quad with Hip Adduction - 10 reps - 1 sets - 1x daily - 7x weekly  Seated Hip Abduction with Resistance - 10 reps - 1 sets - 1x daily - 7x weekly  Standing Transverse Abdominis Contraction - 10 reps - 1 sets - 5 sec hold - 1x daily - 7x weekly

## 2018-09-02 NOTE — Therapy (Signed)
[facility] [address] [place], [place], [address] Phone: [phone]   Fax:  [phone]  Physical Therapy Treatment  Patient Details  Name: Bonnie Conrad MRN: [id number] Date of Birth: [birth year] Referring Provider (PT): Clovis Riley, MD   Encounter Date: 09/02/2018  PT End of Session - 09/02/18 1446    Visit Number  2    Date for PT Re-Evaluation  11/11/18    PT Start Time  1400    PT Stop Time  1442    PT Time Calculation (min)  42 min    Activity Tolerance  Patient tolerated treatment well;Patient limited by pain    Behavior During Therapy  [facility] for tasks assessed/performed       Past Medical History:  Diagnosis Date  . Abdominal hernia   . Anemia   . Anxiety   . Arthritis   . Blood transfusion without reported diagnosis   . Depression    hosp during pregnancy at [facility]  . Hernia of abdominal cavity 2009  . Ovarian cyst     Past Surgical History:  Procedure Laterality Date  . COLONOSCOPY WITH ESOPHAGOGASTRODUODENOSCOPY (EGD)    . DILATION AND CURETTAGE OF UTERUS    . EPIGASTRIC HERNIA REPAIR N/A 06/05/2018   Procedure: OPEN REPAIR OF INCARCERATED EPIGASTRIC HERNIA WITH MESH;  Surgeon: Clovis Riley, MD;  Location: [place];  Service: General;  Laterality: N/A;  . ESOPHAGEAL MANOMETRY N/A 06/29/2018   Procedure: ESOPHAGEAL MANOMETRY (EM);  Surgeon: Mauri Pole, MD;  [facility];  Service: Endoscopy;  Laterality: N/A;  . TUBAL LIGATION  07/25/2011   Procedure: POST PARTUM TUBAL LIGATION;  Surgeon: Mora Bellman, MD;  Location: [facility];  Service: Gynecology;  Laterality: Bilateral;    There were no vitals filed for this visit.  Subjective Assessment - 09/02/18 1403    Subjective  I have pain in my back and up into my left arm    Patient is accompained by:  Interpreter   phone   Currently in Pain?  Yes    Pain Score  8     Pain Location  Back    Pain  Orientation  Right;Left    Pain Radiating Towards  up left side into left arm    Pain Onset  More than a month ago    Multiple Pain Sites  No                       [facility] Adult PT Treatment/Exercise - 09/02/18 0001      Neuro Re-ed    Neuro Re-ed Details   TrA activation educated and practice in different positions;       Exercises   Exercises  Lumbar      Lumbar Exercises: Standing   Other Standing Lumbar Exercises  TrA activation      Lumbar Exercises: Seated   Long Arc Quad on Chair  Strengthening;Both;20 reps   with TrA   Other Seated Lumbar Exercises  clam with TrA - 20x      Lumbar Exercises: Sidelying   Other Sidelying Lumbar Exercises  clam with TrA- 15    Other Sidelying Lumbar Exercises  TrA activation - 5 sec holds      Manual Therapy   Manual Therapy  Soft tissue mobilization    Manual therapy comments  lumbar paraspinals STM and MFR             PT  Education - 09/02/18 1445    Education Details   Access Code: [email or web address]     Person(s) Educated  Patient    Methods  Explanation;Demonstration;Handout;Verbal cues;Tactile cues    Comprehension  Verbalized understanding;Returned demonstration       PT Short Term Goals - 09/02/18 1446      PT SHORT TERM GOAL #1   Title  pt will be able to do LE MMT in sitting due to improved core strength    Baseline  able to do long arc quad    Status  Partially Met      PT SHORT TERM GOAL #2   Title  pt will report 25% less pain    Baseline  back felt better after treatment    Status  Partially Met        PT Long Term Goals - 09/02/18 1447      PT LONG TERM GOAL #1   Title  Pt will report at least 50% less pain on a typical day    Baseline  8/10 using NPRS    Status  On-going      PT LONG TERM GOAL #2   Title  Pt will demonstrate ability to engage abdominal muscles and in order to create tension throughout abdomen and maintain during rolling in bed    Baseline  engaged in sidelying    Status   Partially Met      PT LONG TERM GOAL #3   Title  Pt will be ind with advanced HEP in order to maintain strength for improved function    Status  On-going      PT LONG TERM GOAL #4   Title  Pt will be able to stand and walk for at least 30 minutes at a time so she can prepare meals and clean her home    Status  On-going            Plan - 09/02/18 1452    Clinical Impression Statement  Pt had more pain in back today upon arriving at clinic and reports it has been going up into her  left arm.  Pt had reduced pain in her back but abdomen was more painful after treatment.  Pt has difficulty rolling and staying in one position too long.  She needed cues for breathing and relaxing shoulders when activating TrA.  pt continues to need to work on strength and muscle coordination for improved functional activiites.    PT Treatment/Interventions  ADLs/Self Care Home Management;Cryotherapy;Electrical Stimulation;Biofeedback;Moist Heat;Ultrasound;Stair training;Therapeutic activities;Therapeutic exercise;Gait training;Neuromuscular re-education;Patient/family education;Manual techniques;Passive range of motion;Dry needling;Taping    PT Next Visit Plan  suggest brace or wrap?, transversus ab activation in sitting, sidelying, standing, maintain with roll, abdominal fascial release, diaphragm breathing    PT Home Exercise Plan   Access Code: [email or web address] and Agree with Plan of Care  Patient       Patient will benefit from skilled therapeutic intervention in order to improve the following deficits and impairments:  Pain, Increased fascial restricitons, Decreased coordination, Decreased range of motion, Decreased strength, Difficulty walking, Impaired flexibility, Postural dysfunction  Visit Diagnosis: Chronic bilateral low back pain without sciatica  Muscle weakness (generalized)     Problem List Patient Active Problem List   Diagnosis Date Noted  . Hiatal hernia 08/15/2018  . History  of hernia repair 08/15/2018  . Pain at surgical site 08/15/2018  . Iron deficiency anemia 06/02/2018  . Dysphagia 06/02/2018  . History of  colonic polyps 06/02/2018  . Ventral hernia without obstruction or gangrene 06/02/2018  . Abnormal CT of the abdomen 03/09/2018  . Non-intractable vomiting 03/09/2018  . Chronic generalized abdominal pain 03/09/2018  . Gastroesophageal reflux disease 03/09/2018  . Rectal bleeding 03/09/2018  . Hemorrhoids 03/09/2018  . Major depressive disorder, recurrent episode, severe, with psychotic behavior ([place]) 04/03/2011  . Severe major depression with psychotic features ([place]) 03/06/2011  . Hernia of abdominal cavity 03/06/2011    Jule Ser, PT 09/02/2018, 2:57 PM   [facility] [address], [place], [place], [address] Phone: [phone]   Fax:  [phone]  Name: Sianna Garofano MRN: [id number] Date of Birth: [birth year]

## 2018-09-09 ENCOUNTER — Ambulatory Visit: Payer: Medicaid Other | Admitting: Physical Therapy

## 2018-09-09 ENCOUNTER — Other Ambulatory Visit: Payer: Self-pay

## 2018-09-09 DIAGNOSIS — G8929 Other chronic pain: Secondary | ICD-10-CM | POA: Diagnosis not present

## 2018-09-09 DIAGNOSIS — M6281 Muscle weakness (generalized): Secondary | ICD-10-CM

## 2018-09-09 DIAGNOSIS — M545 Low back pain: Secondary | ICD-10-CM | POA: Diagnosis not present

## 2018-09-09 NOTE — Therapy (Signed)
[facility] [address], [place], [place], [address] Phone: [phone]   Fax:  [phone]  Physical Therapy Treatment  Patient Details  Name: Bonnie Conrad MRN: [id number] Date of Birth: 06/15/1970 Referring Provider (PT): Clovis Riley, MD   Encounter Date: 09/09/2018  PT End of Session - 09/09/18 1410    Visit Number  3    Date for PT Re-Evaluation  11/11/18    Authorization Type  medicaid    Authorization - Visit Number  2    Authorization - Number of Visits  3    PT Start Time  8916    PT Stop Time  1440    PT Time Calculation (min)  43 min    Activity Tolerance  Patient tolerated treatment well    Behavior During Therapy  [facility] for tasks assessed/performed       Past Medical History:  Diagnosis Date  . Abdominal hernia   . Anemia   . Anxiety   . Arthritis   . Blood transfusion without reported diagnosis   . Depression    hosp during pregnancy at [facility]  . Hernia of abdominal cavity 2009  . Ovarian cyst     Past Surgical History:  Procedure Laterality Date  . COLONOSCOPY WITH ESOPHAGOGASTRODUODENOSCOPY (EGD)    . DILATION AND CURETTAGE OF UTERUS    . EPIGASTRIC HERNIA REPAIR N/A 06/05/2018   Procedure: OPEN REPAIR OF INCARCERATED EPIGASTRIC HERNIA WITH MESH;  Surgeon: Clovis Riley, MD;  Location: [place];  Service: General;  Laterality: N/A;  . ESOPHAGEAL MANOMETRY N/A 06/29/2018   Procedure: ESOPHAGEAL MANOMETRY (EM);  Surgeon: Mauri Pole, MD;  [facility];  Service: Endoscopy;  Laterality: N/A;  . TUBAL LIGATION  07/25/2011   Procedure: POST PARTUM TUBAL LIGATION;  Surgeon: Mora Bellman, MD;  Location: [facility];  Service: Gynecology;  Laterality: Bilateral;    There were no vitals filed for this visit.  Subjective Assessment - 09/09/18 1406    Subjective  I have pain here (points to lumbar spine.    Patient is accompained by:  Interpreter   phone   Currently in Pain?  Yes    Pain Score  8     Pain Location  Back    Pain Descriptors / Indicators  Aching    Pain Type  Chronic pain    Pain Onset  More than a month ago    Aggravating Factors   sitting and standing for more than 10-15 min    Pain Relieving Factors  lying down, heat    Multiple Pain Sites  No                       [facility] Adult PT Treatment/Exercise - 09/09/18 0001      Neuro Re-ed    Neuro Re-ed Details   TrA activation educated and practice in different positions throughout      Lumbar Exercises: Stretches   Lower Trunk Rotation  5 reps      Lumbar Exercises: Standing   Heel Raises  15 reps    Functional Squats  15 reps   mini squat holding railing; cues to keep back straight   Functional Squats Limitations  needed a lot of cues to not round back and lean to the right    Row  Strengthening;Right;Left;15 reps;Theraband    Theraband Level (Row)  Level 2 (Red)    Other Standing Lumbar Exercises  TrA activation standing and tapping the steps - 10x each side    Other Standing Lumbar Exercises  pec stretch in doorway - 3 x 20 sec with TrA      Lumbar Exercises: Seated   Long Arc Quad on Chair  Strengthening;Both;20 reps   with TrA     Lumbar Exercises: Supine   Ab Set  10 reps    Dead Bug Limitations  UE only - 10x      Lumbar Exercises: Sidelying   Other Sidelying Lumbar Exercises  clam with TrA- 15    Other Sidelying Lumbar Exercises  TrA activation - 5 sec holds      Manual Therapy   Manual therapy comments  lumbar paraspinals STM and MFR in sidelying               PT Short Term Goals - 09/02/18 1446      PT SHORT TERM GOAL #1   Title  pt will be able to do LE MMT in sitting due to improved core strength    Baseline  able to do long arc quad    Status  Partially Met      PT SHORT TERM GOAL #2   Title  pt will report 25% less pain    Baseline  back felt better after treatment    Status  Partially Met        PT Long Term  Goals - 09/09/18 1410      PT LONG TERM GOAL #1   Title  Pt will report at least 50% less pain on a typical day    Baseline  8/10 using NPRS      PT LONG TERM GOAL #2   Title  Pt will demonstrate ability to engage abdominal muscles and in order to create tension throughout abdomen and maintain during rolling in bed    Baseline  supine and side lying can engage muscles    Status  Partially Met      PT LONG TERM GOAL #3   Title  Pt will be ind with advanced HEP in order to maintain strength for improved function    Status  On-going      PT LONG TERM GOAL #4   Title  Pt will be able to stand and walk for at least 30 minutes at a time so she can prepare meals and clean her home    Baseline  she can stand only 10-15 minutes    Status  On-going            Plan - 09/09/18 1442    Clinical Impression Statement  Pt did well with exercises.  She was able to tolerate more even though she did express some pain throughout.  She needed cues to relax her shoulders and keep her back straight.  Pt will benefit from skilled PT to work on strengthening in variety of positions as well as functional movements such as rolling, transfers and squats.    PT Treatment/Interventions  ADLs/Self Care Home Management;Cryotherapy;Electrical Stimulation;Biofeedback;Moist Heat;Ultrasound;Stair training;Therapeutic activities;Therapeutic exercise;Gait training;Neuromuscular re-education;Patient/family education;Manual techniques;Passive range of motion;Dry needling;Taping    PT Next Visit Plan  transversus ab activation in sitting, sidelying, standing, maintain with roll, f/u on squat technique    PT Home Exercise Plan   Access Code: [email or web address] and Agree with Plan of Care  Patient       Patient will benefit from skilled therapeutic intervention in order to improve the following  deficits and impairments:  Pain, Increased fascial restricitons, Decreased coordination, Decreased range of motion, Decreased  strength, Difficulty walking, Impaired flexibility, Postural dysfunction  Visit Diagnosis: Chronic bilateral low back pain without sciatica  Muscle weakness (generalized)     Problem List Patient Active Problem List   Diagnosis Date Noted  . Hiatal hernia 08/15/2018  . History of hernia repair 08/15/2018  . Pain at surgical site 08/15/2018  . Iron deficiency anemia 06/02/2018  . Dysphagia 06/02/2018  . History of colonic polyps 06/02/2018  . Ventral hernia without obstruction or gangrene 06/02/2018  . Abnormal CT of the abdomen 03/09/2018  . Non-intractable vomiting 03/09/2018  . Chronic generalized abdominal pain 03/09/2018  . Gastroesophageal reflux disease 03/09/2018  . Rectal bleeding 03/09/2018  . Hemorrhoids 03/09/2018  . Major depressive disorder, recurrent episode, severe, with psychotic behavior ([place]) 04/03/2011  . Severe major depression with psychotic features ([place]) 03/06/2011  . Hernia of abdominal cavity 03/06/2011    Jule Ser, PT 09/09/2018, 2:48 PM  [place] [facility] [address] [place], [place], [address] Phone: [phone]   Fax:  [phone]  Name: Bonnie Conrad MRN: [id number] Date of Birth: 19-Sep-1970

## 2018-09-16 ENCOUNTER — Other Ambulatory Visit: Payer: Self-pay

## 2018-09-16 ENCOUNTER — Ambulatory Visit: Payer: Medicaid Other | Attending: Surgery | Admitting: Physical Therapy

## 2018-09-16 ENCOUNTER — Encounter: Payer: Self-pay | Admitting: Physical Therapy

## 2018-09-16 DIAGNOSIS — M6281 Muscle weakness (generalized): Secondary | ICD-10-CM

## 2018-09-16 DIAGNOSIS — G8929 Other chronic pain: Secondary | ICD-10-CM | POA: Diagnosis not present

## 2018-09-16 DIAGNOSIS — M545 Low back pain: Secondary | ICD-10-CM | POA: Diagnosis not present

## 2018-09-16 NOTE — Therapy (Signed)
[facility] [address], [place], [place], [address] Phone: [phone]   Fax:  [phone]  Physical Therapy Treatment  Patient Details  Name: Bonnie Conrad MRN: [id number] Date of Birth: Apr 28, 1970 Referring Provider (PT): Clovis Riley, MD   Encounter Date: 09/16/2018  PT End of Session - 09/16/18 1331    Visit Number  4    Date for PT Re-Evaluation  11/11/18    Authorization Type  medicaid    Authorization - Visit Number  3    Authorization - Number of Visits  3    PT Start Time  1331    PT Stop Time  1412    PT Time Calculation (min)  41 min    Activity Tolerance  Patient tolerated treatment well    Behavior During Therapy  [facility] for tasks assessed/performed       Past Medical History:  Diagnosis Date  . Abdominal hernia   . Anemia   . Anxiety   . Arthritis   . Blood transfusion without reported diagnosis   . Depression    hosp during pregnancy at [facility] health  . Hernia of abdominal cavity 2009  . Ovarian cyst     Past Surgical History:  Procedure Laterality Date  . COLONOSCOPY WITH ESOPHAGOGASTRODUODENOSCOPY (EGD)    . DILATION AND CURETTAGE OF UTERUS    . EPIGASTRIC HERNIA REPAIR N/A 06/05/2018   Procedure: OPEN REPAIR OF INCARCERATED EPIGASTRIC HERNIA WITH MESH;  Surgeon: Clovis Riley, MD;  Location: [place];  Service: General;  Laterality: N/A;  . ESOPHAGEAL MANOMETRY N/A 06/29/2018   Procedure: ESOPHAGEAL MANOMETRY (EM);  Surgeon: Mauri Pole, MD;  [facility];  Service: Endoscopy;  Laterality: N/A;  . TUBAL LIGATION  07/25/2011   Procedure: POST PARTUM TUBAL LIGATION;  Surgeon: Mora Bellman, MD;  Location: [facility];  Service: Gynecology;  Laterality: Bilateral;    There were no vitals filed for this visit.  Subjective Assessment - 09/16/18 1331    Subjective  I am feeling better today    Pain Score  5     Pain Location  Abdomen    Pain Orientation   Mid;Lower    Pain Descriptors / Indicators  Aching    Pain Type  Chronic pain    Pain Onset  More than a month ago         [facility] PT Assessment - 09/16/18 0001      PROM   Overall PROM Comments  hip flexion limited 25% and painful bilat      Strength   Overall Strength Comments  crunch in supine she was unable to lift shoulders only head - painful - core strength 3/5    Strength Assessment Site  Knee;Hip    Right/Left Hip  Right;Left    Right Hip Flexion  4-/5    Right Hip ABduction  4+/5    Left Hip Flexion  4-/5   painful   Left Hip ABduction  4+/5    Right/Left Knee  Right;Left    Right Knee Extension  5/5    Left Knee Extension  4-/5   pain     Flexibility   Hamstrings  45 deg hip flexion with straight leg bilateral                   [facility] Adult PT Treatment/Exercise - 09/16/18 0001      Lumbar Exercises: Standing   Functional Squats  15 reps  holding chair x5; holding 8 lb weight x 10   Functional Squats Limitations  cues to sit back into a chair, mini squat      Lumbar Exercises: Seated   Long Arc Quad on Chair  Strengthening;Both;20 reps   with TrA   Other Seated Lumbar Exercises  march with TrA engaged      Lumbar Exercises: Supine   Heel Slides  10 reps    Other Supine Lumbar Exercises  rolling with TrA               PT Short Term Goals - 09/16/18 1350      PT SHORT TERM GOAL #1   Title  pt will be able to do LE MMT in sitting due to improved core strength    Baseline  5/5 knee extension, hip flexion 4-/5 with increased pain on left side      PT SHORT TERM GOAL #2   Title  pt will report 25% less pain    Baseline       Status  Achieved        PT Long Term Goals - 09/16/18 1336      PT LONG TERM GOAL #1   Title  Pt will report at least 50% less pain on a typical day    Baseline  50% better, can walk longer    Time  8    Period  Weeks    Status  Achieved      PT LONG TERM GOAL #2   Title  Pt will demonstrate ability  to engage abdominal muscles and in order to create tension throughout abdomen and maintain during rolling in bed    Baseline  able to engage muscles and hold while rolling, reports less pain    Status  Achieved      PT LONG TERM GOAL #3   Title  Pt will be ind with advanced HEP in order to maintain strength for improved function    Baseline  still learning, currently very basic core exercises    Status  On-going      PT LONG TERM GOAL #4   Title  Pt will be able to stand and walk for at least 30 minutes at a time so she can prepare meals and clean her home    Baseline  15 minutes    Status  On-going            Plan - 09/16/18 1414    Clinical Impression Statement  Pt is having 50% less back pain.  She is still having abdominal pain with transitions and after standing and sitting for a long time.  Pt continues to have abdominal weakness of 3/5 and very tight hamstrings.  She did meet goal for being able to engage the abdominal muscles and maintained tension while rolling in bed.  Pt is definitely making progress, and she was able to perform seated LE strength test.  She is still limited due to pain and would continue to benefit from skilled PT to progress core strength and posture as well as address remainting impairments so she can achieve greater function.    PT Treatment/Interventions  ADLs/Self Care Home Management;Cryotherapy;Electrical Stimulation;Biofeedback;Moist Heat;Ultrasound;Stair training;Therapeutic activities;Therapeutic exercise;Gait training;Neuromuscular re-education;Patient/family education;Manual techniques;Passive range of motion;Dry needling;Taping    PT Next Visit Plan  transversus ab activation in sitting, sidelying, standing, squat, hamstring stretches    PT Home Exercise Plan   Access Code: [email or web address] , reviewed posture with seated with towel roll  Consulted and Agree with Plan of Care  Patient       Patient will benefit from skilled therapeutic intervention in  order to improve the following deficits and impairments:  Pain, Increased fascial restricitons, Decreased coordination, Decreased range of motion, Decreased strength, Difficulty walking, Impaired flexibility, Postural dysfunction  Visit Diagnosis: Chronic bilateral low back pain without sciatica  Muscle weakness (generalized)     Problem List Patient Active Problem List   Diagnosis Date Noted  . Hiatal hernia 08/15/2018  . History of hernia repair 08/15/2018  . Pain at surgical site 08/15/2018  . Iron deficiency anemia 06/02/2018  . Dysphagia 06/02/2018  . History of colonic polyps 06/02/2018  . Ventral hernia without obstruction or gangrene 06/02/2018  . Abnormal CT of the abdomen 03/09/2018  . Non-intractable vomiting 03/09/2018  . Chronic generalized abdominal pain 03/09/2018  . Gastroesophageal reflux disease 03/09/2018  . Rectal bleeding 03/09/2018  . Hemorrhoids 03/09/2018  . Major depressive disorder, recurrent episode, severe, with psychotic behavior ([place]) 04/03/2011  . Severe major depression with psychotic features ([place]) 03/06/2011  . Hernia of abdominal cavity 03/06/2011    Jule Ser, PT 09/16/2018, 2:27 PM  [facility] [address] [place], [place], [address] Phone: [phone]   Fax:  [phone]  Name: Bonnie Conrad MRN: [id number] Date of Birth: 1970-08-29

## 2018-09-29 ENCOUNTER — Ambulatory Visit: Payer: Medicaid Other | Admitting: Physical Therapy

## 2018-09-29 ENCOUNTER — Other Ambulatory Visit: Payer: Self-pay

## 2018-09-29 DIAGNOSIS — M6281 Muscle weakness (generalized): Secondary | ICD-10-CM

## 2018-09-29 DIAGNOSIS — G8929 Other chronic pain: Secondary | ICD-10-CM | POA: Diagnosis not present

## 2018-09-29 DIAGNOSIS — M545 Low back pain: Secondary | ICD-10-CM | POA: Diagnosis not present

## 2018-09-29 NOTE — Therapy (Signed)
[facility] [address] [place], [place], [address] Phone: [phone]   Fax:  [phone]  Physical Therapy Treatment  Patient Details  Name: Bonnie Conrad MRN: [id number] Date of Birth: 1970-07-10 Referring Provider (PT): Clovis Riley, MD   Encounter Date: 09/29/2018  PT End of Session - 09/29/18 1336    Visit Number  5    Date for PT Re-Evaluation  11/11/18    Authorization Type  medicaid    Authorization - Visit Number  1    Authorization - Number of Visits  8    PT Start Time  1330    PT Stop Time  1415    PT Time Calculation (min)  45 min    Activity Tolerance  Patient tolerated treatment well       Past Medical History:  Diagnosis Date  . Abdominal hernia   . Anemia   . Anxiety   . Arthritis   . Blood transfusion without reported diagnosis   . Depression    hosp during pregnancy at [facility] health  . Hernia of abdominal cavity 2009  . Ovarian cyst     Past Surgical History:  Procedure Laterality Date  . COLONOSCOPY WITH ESOPHAGOGASTRODUODENOSCOPY (EGD)    . DILATION AND CURETTAGE OF UTERUS    . EPIGASTRIC HERNIA REPAIR N/A 06/05/2018   Procedure: OPEN REPAIR OF INCARCERATED EPIGASTRIC HERNIA WITH MESH;  Surgeon: Clovis Riley, MD;  Location: [facility];  Service: General;  Laterality: N/A;  . ESOPHAGEAL MANOMETRY N/A 06/29/2018   Procedure: ESOPHAGEAL MANOMETRY (EM);  Surgeon: Mauri Pole, MD;  [facility];  Service: Endoscopy;  Laterality: N/A;  . TUBAL LIGATION  07/25/2011   Procedure: POST PARTUM TUBAL LIGATION;  Surgeon: Mora Bellman, MD;  Location: [facility];  Service: Gynecology;  Laterality: Bilateral;    There were no vitals filed for this visit.  Subjective Assessment - 09/29/18 1333    Subjective  I am sore today in the back.  I was able to walk more.  I walked for an hour.  When I finish walking the abdomen hurts more.  Lifting has gotten better    Patient is accompained by:  Interpreter    Limitations  Standing;Walking;Sitting;Lifting    Currently in Pain?  Yes    Pain Score  6     Pain Location  Back    Pain Orientation  Left    Pain Descriptors / Indicators  Sore    Pain Onset  More than a month ago    Pain Frequency  Intermittent    Multiple Pain Sites  No                       [facility] Adult PT Treatment/Exercise - 09/29/18 0001      Lumbar Exercises: Standing   Functional Squats  15 reps   using cane and then wall for postural cues   Functional Squats Limitations  didn't do as well with keeping back straight today; switched to wall slide    Wall Slides  10 reps;2 seconds    Other Standing Lumbar Exercises  shoulder ER and horizontal abduction with TrA    Other Standing Lumbar Exercises  toe tap on 4 inch step 20x each      Lumbar Exercises: Seated   Other Seated Lumbar Exercises  side bend stretch - 10x to the right to stretch left lumbar and QL    Other Seated Lumbar  Exercises  thoracic rotation stretch with lumbar flexion, diagonal stretch - 10x each both ways      Lumbar Exercises: Sidelying   Other Sidelying Lumbar Exercises  clam with TrA- 15    Other Sidelying Lumbar Exercises  TrA activation - 5 sec holds      Manual Therapy   Manual therapy comments  lumbar paraspinals STM and MFR in sidelying               PT Short Term Goals - 09/16/18 1350      PT SHORT TERM GOAL #1   Title  pt will be able to do LE MMT in sitting due to improved core strength    Baseline  5/5 knee extension, hip flexion 4-/5 with increased pain on left side      PT SHORT TERM GOAL #2   Title  pt will report 25% less pain    Baseline       Status  Achieved        PT Long Term Goals - 09/29/18 1421      PT LONG TERM GOAL #3   Title  Pt will be ind with advanced HEP in order to maintain strength for improved function    Status  On-going            Plan - 09/29/18 1412    Clinical Impression  Statement  Pt demonstrates difficulty with squat technique and had increased pain when attempting squat with dowel.  Pt demonstrates weight shift to the right and increased tension in low back with squats. She had better success with wall slides for cues and increased core stability.  Pt has less pain at the end of treatment today.  She will benefit from skilled PT to continue to progress core strength.    PT Treatment/Interventions  ADLs/Self Care Home Management;Cryotherapy;Electrical Stimulation;Biofeedback;Moist Heat;Ultrasound;Stair training;Therapeutic activities;Therapeutic exercise;Gait training;Neuromuscular re-education;Patient/family education;Manual techniques;Passive range of motion;Dry needling;Taping    PT Next Visit Plan  transversus ab activation in sitting, sidelying, standing, wall slides and squat, hamstring stretches    PT Home Exercise Plan   Access Code: [email or web address] , reviewed posture with seated with towel roll    Recommended Other Services  cert signed    Consulted and Agree with Plan of Care  Patient       Patient will benefit from skilled therapeutic intervention in order to improve the following deficits and impairments:  Pain, Increased fascial restricitons, Decreased coordination, Decreased range of motion, Decreased strength, Difficulty walking, Impaired flexibility, Postural dysfunction  Visit Diagnosis: 1. Chronic bilateral low back pain without sciatica   2. Muscle weakness (generalized)        Problem List Patient Active Problem List   Diagnosis Date Noted  . Hiatal hernia 08/15/2018  . History of hernia repair 08/15/2018  . Pain at surgical site 08/15/2018  . Iron deficiency anemia 06/02/2018  . Dysphagia 06/02/2018  . History of colonic polyps 06/02/2018  . Ventral hernia without obstruction or gangrene 06/02/2018  . Abnormal CT of the abdomen 03/09/2018  . Non-intractable vomiting 03/09/2018  . Chronic generalized abdominal pain 03/09/2018  .  Gastroesophageal reflux disease 03/09/2018  . Rectal bleeding 03/09/2018  . Hemorrhoids 03/09/2018  . Major depressive disorder, recurrent episode, severe, with psychotic behavior ([place]) 04/03/2011  . Severe major depression with psychotic features ([place]) 03/06/2011  . Hernia of abdominal cavity 03/06/2011    Jule Ser, PT 09/29/2018, 2:26 PM  [facility] [address]  [place], [place], [place], [address] Phone: [phone]   Fax:  [phone]  Name: Bonnie Conrad MRN: [id number] Date of Birth: 01-13-1971

## 2018-09-30 ENCOUNTER — Ambulatory Visit (INDEPENDENT_AMBULATORY_CARE_PROVIDER_SITE_OTHER): Payer: Medicaid Other | Admitting: Gastroenterology

## 2018-09-30 DIAGNOSIS — K649 Unspecified hemorrhoids: Secondary | ICD-10-CM

## 2018-09-30 DIAGNOSIS — D509 Iron deficiency anemia, unspecified: Secondary | ICD-10-CM | POA: Diagnosis not present

## 2018-09-30 DIAGNOSIS — R1084 Generalized abdominal pain: Secondary | ICD-10-CM | POA: Diagnosis not present

## 2018-09-30 DIAGNOSIS — G8929 Other chronic pain: Secondary | ICD-10-CM

## 2018-09-30 MED ORDER — DICYCLOMINE HCL 10 MG PO CAPS: 10.0000 mg | ORAL_CAPSULE | Freq: Three times a day (TID) | ORAL | 3 refills | Status: DC

## 2018-09-30 NOTE — Progress Notes (Signed)
[facility] VISIT   Primary Care Provider Antony Blackbird, MD [place] [address] [phone]  Patient Profile: Bonnie Conrad is a 48 y.o. female with a pmh significant for Multiple Gestations, Ovarian Cysts, hx of MDD/Anxiety, GERD, colon polyps (SSPs), ventral diastases/hernia (s/p recent repair), Chronic Abdominal Pain (improving), Iron Deficiency.  The patient presents to the [facility] for an evaluation and management of problem(s) noted below:  Problem List 1. Iron deficiency anemia, unspecified iron deficiency anemia type   2. Hemorrhoids, unspecified hemorrhoid type   3. Chronic generalized abdominal pain     I connected with  Bonnie Conrad on 09/30/18. I verified that I was speaking with the correct person using two identifiers through the use of interpreter services. Due to the COVID-19 Pandemic, this service was provided via telemedicine using audio only. Interactive audio and video telecommunications were attempted between this provider and patient, however failed, due to patient having technical difficulties and inability to access this type of technique with interpreter services as well per the patient.  Thus to provide timely and excellent care, we continued and completed visit with audio only. The patient was located at home. The provider was located in the office. The patient did consent to this visit and is aware of charges through their insurance as well as the limitations of evaluation and management by telemedicine. Other persons participating in this telemedicine service were interpreter services. Time spent on visit was 15 minutes with the patient and in coordination of care.   History of Present Illness: Please see initial consultation note and prior progress notes for full details of HPI.   Interval History  The patient did not get her iron indices as we had  previously discussed however, she is still feeling well.  She is taking her iron daily.  Dysphagia has not recurred significantly.  GERD symptoms remain under good control while taking twice daily PPI.  She is taking at the correct timing prior to her meals.  Abdominal pain continues to improve.  No major significant changes overall.  Not having significant bleeding per rectum from hemorrhoids at this time.  GI Review of Systems Positive as above Negative for odynophagia, nausea, vomiting, change in bowel habits, melena, hematochezia  Review of Systems General: Denies fevers/chills Cardiovascular: Denies current chest pain Pulmonary: Denies shortness of breath Gastroenterological: See HPI Genitourinary: Denies darkened urine Hematological: Denies easy bruising Dermatological: Denies jaundice Psychological: Mood is stable   Medications Current Outpatient Medications  Medication Sig Dispense Refill  . dicyclomine (BENTYL) 10 MG capsule Take 1 capsule (10 mg total) by mouth 4 (four) times daily -  before meals and at bedtime. To relieve abdominal pain 120 capsule 3  . ferrous gluconate (FERGON) 324 MG tablet Take 1 tablet (324 mg total) by mouth daily with breakfast for 30 days. 30 tablet 6  . fluticasone (FLONASE) 50 MCG/ACT nasal spray Place 1 spray into both nostrils daily. 16 g 0  . omeprazole (PRILOSEC) 40 MG capsule Take 1 capsule (40 mg total) by mouth 2 (two) times daily. 60 capsule 6  . traMADol (ULTRAM) 50 MG tablet Take 1 tablet (50 mg total) by mouth every 6 (six) hours as needed. (Patient taking differently: Take 50 mg by mouth every 6 (six) hours as needed for moderate pain. ) 20 tablet 0   No current facility-administered medications for this visit.     Allergies Allergies  Allergen Reactions  . Penicillins Swelling  Did it involve swelling of the face/tongue/throat, SOB, or low BP? Yes Did it involve sudden or severe rash/hives, skin peeling, or any reaction on the  inside of your mouth or nose? No Did you need to seek medical attention at a hospital or doctor's office? No When did it last happen?1 or 2 years ago If all above answers are "NO", may proceed with cephalosporin use.   Marland Kitchen Morphine And Related   . Vicodin [Hydrocodone-Acetaminophen] Hives and Itching    Histories Past Medical History:  Diagnosis Date  . Abdominal hernia   . Anemia   . Anxiety   . Arthritis   . Blood transfusion without reported diagnosis   . Depression    hosp during pregnancy at [facility]  . Hernia of abdominal cavity 2009  . Ovarian cyst    Past Surgical History:  Procedure Laterality Date  . COLONOSCOPY WITH ESOPHAGOGASTRODUODENOSCOPY (EGD)    . DILATION AND CURETTAGE OF UTERUS    . EPIGASTRIC HERNIA REPAIR N/A 06/05/2018   Procedure: OPEN REPAIR OF INCARCERATED EPIGASTRIC HERNIA WITH MESH;  Surgeon: Clovis Riley, MD;  Location: [place];  Service: General;  Laterality: N/A;  . ESOPHAGEAL MANOMETRY N/A 06/29/2018   Procedure: ESOPHAGEAL MANOMETRY (EM);  Surgeon: Mauri Pole, MD;  [facility];  Service: Endoscopy;  Laterality: N/A;  . TUBAL LIGATION  07/25/2011   Procedure: POST PARTUM TUBAL LIGATION;  Surgeon: Mora Bellman, MD;  Location: [facility];  Service: Gynecology;  Laterality: Bilateral;   Social History   Socioeconomic History  . Marital status: Married    Spouse name: Not on file  . Number of children: Not on file  . Years of education: Not on file  . Highest education level: Not on file  Occupational History  . Not on file  Social Needs  . Financial resource strain: Not on file  . Food insecurity    Worry: Not on file    Inability: Not on file  . Transportation needs    Medical: Not on file    Non-medical: Not on file  Tobacco Use  . Smoking status: Current Some Day Smoker  . Smokeless tobacco: Never Used  . Tobacco comment: a few months ago  Substance and Sexual Activity  . Alcohol use: No  . Drug use: No   . Sexual activity: Yes    Birth control/protection: None    Comment: tubal  Lifestyle  . Physical activity    Days per week: Not on file    Minutes per session: Not on file  . Stress: Not on file  Relationships  . Social Herbalist on phone: Not on file    Gets together: Not on file    Attends religious service: Not on file    Active member of club or organization: Not on file    Attends meetings of clubs or organizations: Not on file    Relationship status: Not on file  . Intimate partner violence    Fear of current or ex partner: Not on file    Emotionally abused: Not on file    Physically abused: Not on file    Forced sexual activity: Not on file  Other Topics Concern  . Not on file  Social History Narrative  . Not on file   Family History  Problem Relation Age of Onset  . Hypertension Mother   . Anesthesia problems Neg Hx   . Hypotension Neg Hx   . Malignant hyperthermia  Neg Hx   . Pseudochol deficiency Neg Hx   . Colon cancer Neg Hx   . Esophageal cancer Neg Hx   . Inflammatory bowel disease Neg Hx   . Liver disease Neg Hx   . Pancreatic cancer Neg Hx   . Rectal cancer Neg Hx   . Stomach cancer Neg Hx   . Colon polyps Neg Hx    I have reviewed her medical, social, and family history in detail and updated the electronic medical record as necessary.    PHYSICAL EXAMINATION  Telehealth visit   REVIEW OF DATA  I reviewed the following data at the time of this encounter:  GI Procedures and Studies  No new relevant studies to review  Laboratory Studies  Reviewed in epic  Imaging Studies  No new studies to review   ASSESSMENT  Ms. Puccini is a 48 y.o. female with a pmh significant for Multiple Gestations, Ovarian Cysts, hx of MDD/Anxiety, GERD, colon polyps (SSPs), ventral diastases/hernia (s/p recent repair), Chronic Abdominal Pain (improving), Iron Deficiency.  The patient is seen today for evaluation and management of:  1. Iron  deficiency anemia, unspecified iron deficiency anemia type   2. Hemorrhoids, unspecified hemorrhoid type   3. Chronic generalized abdominal pain    The patient continues to do well.  However, she did not get her iron indices and blood counts checked as we had wanted.  We will plan for her to come in the next 1 week to have these completed.  If her iron indices are improved she will continue on oral iron once daily.  If iron indices are not improved we will consider IV iron infusions.  GERD seems to be doing much better now that she is taking her PPI before meals.  Abdominal pain continues to improve however she sometimes still has spasms and had run out of dicyclomine but found it helpful so we will reorder that.  We will update her medications that she is no longer needing any antiemetics.  Depending on her iron indices we will consider the role of IV iron infusions x2 or 3 and subsequently then consider the role of capsule endoscopy based on follow-up if necessary.  Patient is appreciative for all the care that she is received.  All patient questions were answered, to the best of my ability, and the patient agrees to the aforementioned plan of action with follow-up as indicated.   PLAN  Obtain previously ordered labs in the next week For now, continue PPI twice daily but likely will consider trying to decrease in the coming months Continue iron daily Hemorrhoids under good control (continue to use over-the-counter medications as necessary) Symptoms seem to be doing well so we will hold on repeat Manometry as well as gastric emptying study Based on iron indices will determine need for IV iron infusions plus/minus video capsule endoscopy     No orders of the defined types were placed in this encounter.   New Prescriptions   No medications on file   Modified Medications   Modified Medication Previous Medication   DICYCLOMINE (BENTYL) 10 MG CAPSULE dicyclomine (BENTYL) 10 MG capsule      Take  1 capsule (10 mg total) by mouth 4 (four) times daily -  before meals and at bedtime. To relieve abdominal pain    Take 1 capsule (10 mg total) by mouth 4 (four) times daily -  before meals and at bedtime. To relieve abdominal pain    Planned Follow Up: No  follow-ups on file.   Justice Britain, MD [facility] Advanced [facility]oscopy Office # [phone]

## 2018-09-30 NOTE — Patient Instructions (Addendum)
Continue Bentyl.   We have sent the following medications to your pharmacy for you to pick up at your convenience: Bentyl  Thank you for choosing me and [facility].  Dr. Rush Landmark

## 2018-10-03 ENCOUNTER — Encounter: Payer: Self-pay | Admitting: Gastroenterology

## 2018-10-14 ENCOUNTER — Ambulatory Visit: Payer: Medicaid Other | Attending: Surgery | Admitting: Physical Therapy

## 2018-10-14 ENCOUNTER — Other Ambulatory Visit: Payer: Self-pay

## 2018-10-14 DIAGNOSIS — M545 Low back pain: Secondary | ICD-10-CM | POA: Insufficient documentation

## 2018-10-14 DIAGNOSIS — M6281 Muscle weakness (generalized): Secondary | ICD-10-CM | POA: Insufficient documentation

## 2018-10-14 DIAGNOSIS — G8929 Other chronic pain: Secondary | ICD-10-CM | POA: Insufficient documentation

## 2018-10-15 ENCOUNTER — Encounter: Payer: Self-pay | Admitting: Physical Therapy

## 2018-10-15 NOTE — Patient Instructions (Signed)
Access Code: [email or web address]  URL: [email or web address]  Date: 10/15/2018  Prepared by: Jari Favre   Exercises  Sidelying Transversus Abdominis Bracing - 10 reps - 1 sets - 5 sec hold - 1x daily - 7x weekly  Clamshell - 10 reps - 1 sets - 1x daily - 7x weekly  Seated Long Arc Quad with Hip Adduction - 10 reps - 1 sets - 1x daily - 7x weekly  Seated Hip Abduction with Resistance - 10 reps - 1 sets - 1x daily - 7x weekly  Standing Transverse Abdominis Contraction - 10 reps - 1 sets - 5 sec hold - 1x daily - 7x weekly  Standing Hamstring Stretch with Step - 3 reps - 1 sets - 30 sec hold - 1x daily - 7x weekly  Standing Hip Flexion March - 10 reps - 2 sets - 1x daily - 7x weekly  Mini Squat - 10 reps - 1 sets - 1x daily - 7x weekly  Supine Lower Trunk Rotation - 10 reps - 3 sets - 1x daily - 7x weekly  Bent Knee Fallouts - 10 reps - 3 sets - 1x daily - 7x weekly

## 2018-10-15 NOTE — Therapy (Signed)
[facility] [address] [place], [place], [address] Phone: [phone]   Fax:  [phone]  Physical Therapy Treatment  Patient Details  Name: Bonnie Conrad MRN: [id number] Date of Birth: [birth year] Referring Provider (PT): Clovis Riley, MD   Encounter Date: 10/14/2018  PT End of Session - 10/15/18 1210    Visit Number  6    Date for PT Re-Evaluation  11/11/18    Authorization Type  medicaid    PT Start Time  1901    PT Stop Time  1948    PT Time Calculation (min)  47 min    Activity Tolerance  Patient tolerated treatment well;Patient limited by pain    Behavior During Therapy  [facility] for tasks assessed/performed       Past Medical History:  Diagnosis Date  . Abdominal hernia   . Anemia   . Anxiety   . Arthritis   . Blood transfusion without reported diagnosis   . Depression    hosp during pregnancy at [facility]  . Hernia of abdominal cavity 2009  . Ovarian cyst     Past Surgical History:  Procedure Laterality Date  . COLONOSCOPY WITH ESOPHAGOGASTRODUODENOSCOPY (EGD)    . DILATION AND CURETTAGE OF UTERUS    . EPIGASTRIC HERNIA REPAIR N/A 06/05/2018   Procedure: OPEN REPAIR OF INCARCERATED EPIGASTRIC HERNIA WITH MESH;  Surgeon: Clovis Riley, MD;  Location: [place];  Service: General;  Laterality: N/A;  . ESOPHAGEAL MANOMETRY N/A 06/29/2018   Procedure: ESOPHAGEAL MANOMETRY (EM);  Surgeon: Mauri Pole, MD;  [facility];  Service: Endoscopy;  Laterality: N/A;  . TUBAL LIGATION  07/25/2011   Procedure: POST PARTUM TUBAL LIGATION;  Surgeon: Mora Bellman, MD;  Location: [facility];  Service: Gynecology;  Laterality: Bilateral;    There were no vitals filed for this visit.  Subjective Assessment - 10/14/18 1910    Subjective  I am okay.  Around the surgery the suture side is bothering me.  At night I can't sleep on my left side and shifting side    Currently in Pain?  Yes     Pain Score  5     Pain Location  Abdomen    Pain Orientation  Left    Pain Descriptors / Indicators  Aching    Pain Type  Chronic pain    Multiple Pain Sites  No         [facility] PT Assessment - 10/15/18 0001      Strength   Right Hip Flexion  5/5    Left Hip Flexion  4-/5    Right Knee Extension  5/5    Left Knee Extension  4-/5      Flexibility   Hamstrings  45 deg hip flexion with straight leg bilateral                   [facility] Adult PT Treatment/Exercise - 10/15/18 0001      Lumbar Exercises: Stretches   Active Hamstring Stretch  Right;Left;3 reps;20 seconds   supine strap   Lower Trunk Rotation  5 reps      Lumbar Exercises: Supine   Bent Knee Raise  15 reps    Bent Knee Raise Limitations  towel roll under sacrum    Other Supine Lumbar Exercises  bent knee fall out - 20x towel roll under sacrum      Manual Therapy   Manual therapy comments  lumbar paraspinals, left  abdomen, left gluteals  STM and MFR in Rt sidelying             PT Education - 10/15/18 1207    Education Details  Access Code: [email or web address]    Person(s) Educated  Patient    Methods  Explanation;Demonstration;Verbal cues;Handout    Comprehension  Verbalized understanding;Returned demonstration       PT Short Term Goals - 09/16/18 1350      PT SHORT TERM GOAL #1   Title  pt will be able to do LE MMT in sitting due to improved core strength    Baseline  5/5 knee extension, hip flexion 4-/5 with increased pain on left side      PT SHORT TERM GOAL #2   Title  pt will report 25% less pain    Baseline       Status  Achieved        PT Long Term Goals - 10/14/18 1945      PT LONG TERM GOAL #1   Title  Pt will report at least 50% less pain on a typical day    Baseline  50% better, can walk longer    Status  Achieved      PT LONG TERM GOAL #2   Title  Pt will demonstrate ability to engage abdominal muscles and in order to create tension throughout abdomen and maintain during  rolling in bed    Baseline  able to engage muscles and hold while rolling, reports less pain    Status  Achieved      PT LONG TERM GOAL #3   Title  Pt will be ind with advanced HEP in order to maintain strength for improved function    Baseline  still learning, currently very basic core exercises    Time  8    Period  Weeks    Status  On-going      PT LONG TERM GOAL #4   Title  Pt will be able to stand and walk for at least 30 minutes at a time so she can prepare meals and clean her home    Baseline  45-60 minutes    Status  Achieved      PT LONG TERM GOAL #5   Title  Pt will be able            Plan - 10/15/18 1212    Clinical Impression Statement  Pt is still very sore around left lumbar and abdominal region.  She makes grimacing faces when performing bed mobility.  Pt has made noticeable progress however demonstrating increased level of difficutly with her HEP.  She is also standing up to 60 minutes at a time which is up from when she started PT and was only able to tolerate standing for 10 minutes.  She demonstrates improved LE strength.  She will continue to benefit from skilled PT for pain management as she returns to greater level of function.    Comorbidities  hysterectomy, hernia repair, chronic pain    PT Treatment/Interventions  ADLs/Self Care Home Management;Cryotherapy;Electrical Stimulation;Biofeedback;Moist Heat;Ultrasound;Stair training;Therapeutic activities;Therapeutic exercise;Gait training;Neuromuscular re-education;Patient/family education;Manual techniques;Passive range of motion;Dry needling;Taping    PT Next Visit Plan  transversus ab activation in sitting, sidelying, standing, wall slides and squat, hamstring stretches, progress HEP and finalize    PT Home Exercise Plan   Access Code: [email or web address] , reviewed posture with seated with towel roll    Consulted and Agree with Plan of Care  Patient  Patient will benefit from skilled therapeutic intervention in  order to improve the following deficits and impairments:  Pain, Increased fascial restricitons, Decreased coordination, Decreased range of motion, Decreased strength, Difficulty walking, Impaired flexibility, Postural dysfunction  Visit Diagnosis: No diagnosis found.     Problem List Patient Active Problem List   Diagnosis Date Noted  . Hiatal hernia 08/15/2018  . History of hernia repair 08/15/2018  . Pain at surgical site 08/15/2018  . Iron deficiency anemia 06/02/2018  . Dysphagia 06/02/2018  . History of colonic polyps 06/02/2018  . Ventral hernia without obstruction or gangrene 06/02/2018  . Abnormal CT of the abdomen 03/09/2018  . Non-intractable vomiting 03/09/2018  . Chronic generalized abdominal pain 03/09/2018  . Gastroesophageal reflux disease 03/09/2018  . Rectal bleeding 03/09/2018  . Hemorrhoids 03/09/2018  . Major depressive disorder, recurrent episode, severe, with psychotic behavior ([place]) 04/03/2011  . Severe major depression with psychotic features ([place]) 03/06/2011  . Hernia of abdominal cavity 03/06/2011    Jule Ser, PT 10/15/2018, 12:28 PM  [facility] [address], [place], [place], [address] Phone: [phone]   Fax:  [phone]  Name: Bonnie Conrad MRN: [id number] Date of Birth: 1970/05/24

## 2018-10-30 ENCOUNTER — Ambulatory Visit: Payer: Medicaid Other | Admitting: Physical Therapy

## 2018-10-30 ENCOUNTER — Other Ambulatory Visit: Payer: Self-pay

## 2018-10-30 DIAGNOSIS — M6281 Muscle weakness (generalized): Secondary | ICD-10-CM | POA: Diagnosis not present

## 2018-10-30 DIAGNOSIS — M545 Low back pain: Secondary | ICD-10-CM | POA: Diagnosis not present

## 2018-10-30 DIAGNOSIS — G8929 Other chronic pain: Secondary | ICD-10-CM

## 2018-10-30 NOTE — Therapy (Signed)
[facility] [address], [place], [place], [address] Phone: [phone]   Fax:  [phone]  Physical Therapy Treatment  Patient Details  Name: Bonnie Conrad MRN: [id number] Date of Birth: 1971-02-09 Referring Provider (PT): Clovis Riley, MD   Encounter Date: 10/30/2018  PT End of Session - 10/30/18 1027    Visit Number  7    Date for PT Re-Evaluation  11/11/18    Authorization Type  medicaid    Authorization - Visit Number  2    Authorization - Number of Visits  8    PT Start Time  0940    PT Stop Time  1020    PT Time Calculation (min)  40 min    Activity Tolerance  Patient tolerated treatment well;Patient limited by pain    Behavior During Therapy  [facility] for tasks assessed/performed       Past Medical History:  Diagnosis Date  . Abdominal hernia   . Anemia   . Anxiety   . Arthritis   . Blood transfusion without reported diagnosis   . Depression    hosp during pregnancy at [facility] health  . Hernia of abdominal cavity 2009  . Ovarian cyst     Past Surgical History:  Procedure Laterality Date  . COLONOSCOPY WITH ESOPHAGOGASTRODUODENOSCOPY (EGD)    . DILATION AND CURETTAGE OF UTERUS    . EPIGASTRIC HERNIA REPAIR N/A 06/05/2018   Procedure: OPEN REPAIR OF INCARCERATED EPIGASTRIC HERNIA WITH MESH;  Surgeon: Clovis Riley, MD;  Location: [facility];  Service: General;  Laterality: N/A;  . ESOPHAGEAL MANOMETRY N/A 06/29/2018   Procedure: ESOPHAGEAL MANOMETRY (EM);  Surgeon: Mauri Pole, MD;  [facility];  Service: Endoscopy;  Laterality: N/A;  . TUBAL LIGATION  07/25/2011   Procedure: POST PARTUM TUBAL LIGATION;  Surgeon: Mora Bellman, MD;  Location: [facility];  Service: Gynecology;  Laterality: Bilateral;    There were no vitals filed for this visit.  Subjective Assessment - 10/30/18 0943    Subjective  I am okay.  A little pain today. I have been feeling nauseus and getting  pain on the Rt side also this last week.    Patient Stated Goals  be able to stand and walk for at least 30-60 minutes    Currently in Pain?  Yes    Pain Score  5     Pain Location  Abdomen    Pain Orientation  Left    Pain Descriptors / Indicators  Aching    Pain Type  Chronic pain    Pain Radiating Towards  around to the back a little and now over to the Rt side    Pain Onset  More than a month ago                       [facility] Adult PT Treatment/Exercise - 10/30/18 0001      Self-Care   Self-Care  Other Self-Care Comments    Other Self-Care Comments   training patient on doing myfascial release on herself, educated using heat and wear loose fitting clothes around her waiste      Modalities   Modalities  Moist Heat      Moist Heat Therapy   Number Minutes Moist Heat  25 Minutes   used during manual treatment - switch front/back   Moist Heat Location  Lumbar Spine   and abdomen - switching sides     Manual  Therapy   Manual Therapy  Myofascial release    Myofascial Release  thoracolumbar fascia, bilateral abdominal fascial release               PT Short Term Goals - 09/16/18 1350      PT SHORT TERM GOAL #1   Title  pt will be able to do LE MMT in sitting due to improved core strength    Baseline  5/5 knee extension, hip flexion 4-/5 with increased pain on left side      PT SHORT TERM GOAL #2   Title  pt will report 25% less pain    Baseline       Status  Achieved        PT Long Term Goals - 10/14/18 1945      PT LONG TERM GOAL #1   Title  Pt will report at least 50% less pain on a typical day    Baseline  50% better, can walk longer    Status  Achieved      PT LONG TERM GOAL #2   Title  Pt will demonstrate ability to engage abdominal muscles and in order to create tension throughout abdomen and maintain during rolling in bed    Baseline  able to engage muscles and hold while rolling, reports less pain    Status  Achieved      PT LONG  TERM GOAL #3   Title  Pt will be ind with advanced HEP in order to maintain strength for improved function    Baseline  still learning, currently very basic core exercises    Time  8    Period  Weeks    Status  On-going      PT LONG TERM GOAL #4   Title  Pt will be able to stand and walk for at least 30 minutes at a time so she can prepare meals and clean her home    Baseline  45-60 minutes    Status  Achieved      PT LONG TERM GOAL #5   Title  Pt will be able            Plan - 10/30/18 1103    Clinical Impression Statement  Pt was cramping with pain during session today but was aleviated with changes in position.  She was educated and performed self myofascial release to abdomen. Pt gets good release when doing myofascial release and was able to understand how to perform on herself.  She was educated on wearing more loose fitting pants for improved circulation to abdomen.  Pt will benefit from skilled PT to re-assess and ensure successful transition to HEP.    PT Treatment/Interventions  ADLs/Self Care Home Management;Cryotherapy;Electrical Stimulation;Biofeedback;Moist Heat;Ultrasound;Stair training;Therapeutic activities;Therapeutic exercise;Gait training;Neuromuscular re-education;Patient/family education;Manual techniques;Passive range of motion;Dry needling;Taping    PT Next Visit Plan  f/u on doing self massage and wearing looser pants, transversus ab activation in sitting, sidelying, standing, wall slides and squat, hamstring stretches, progress HEP and finalize    PT Home Exercise Plan   Access Code: [email or web address] , reviewed posture with seated with towel roll    Consulted and Agree with Plan of Care  Patient       Patient will benefit from skilled therapeutic intervention in order to improve the following deficits and impairments:  Pain, Increased fascial restricitons, Decreased coordination, Decreased range of motion, Decreased strength, Difficulty walking, Impaired flexibility,  Postural dysfunction  Visit Diagnosis: 1. Chronic bilateral low back pain  without sciatica   2. Muscle weakness (generalized)        Problem List Patient Active Problem List   Diagnosis Date Noted  . Hiatal hernia 08/15/2018  . History of hernia repair 08/15/2018  . Pain at surgical site 08/15/2018  . Iron deficiency anemia 06/02/2018  . Dysphagia 06/02/2018  . History of colonic polyps 06/02/2018  . Ventral hernia without obstruction or gangrene 06/02/2018  . Abnormal CT of the abdomen 03/09/2018  . Non-intractable vomiting 03/09/2018  . Chronic generalized abdominal pain 03/09/2018  . Gastroesophageal reflux disease 03/09/2018  . Rectal bleeding 03/09/2018  . Hemorrhoids 03/09/2018  . Major depressive disorder, recurrent episode, severe, with psychotic behavior ([place]) 04/03/2011  . Severe major depression with psychotic features ([place]) 03/06/2011  . Hernia of abdominal cavity 03/06/2011    Jule Ser, PT 10/30/2018, 11:18 AM  [facility] [address] [place], [place], [address] Phone: [phone]   Fax:  [phone]  Name: Bonnie Conrad MRN: [id number] Date of Birth: 1970/11/11

## 2018-11-03 ENCOUNTER — Encounter: Payer: Self-pay | Admitting: Physical Therapy

## 2018-11-03 ENCOUNTER — Ambulatory Visit: Payer: Medicaid Other | Admitting: Physical Therapy

## 2018-11-03 ENCOUNTER — Other Ambulatory Visit: Payer: Self-pay

## 2018-11-03 DIAGNOSIS — M545 Low back pain: Secondary | ICD-10-CM | POA: Diagnosis not present

## 2018-11-03 DIAGNOSIS — G8929 Other chronic pain: Secondary | ICD-10-CM

## 2018-11-03 DIAGNOSIS — M6281 Muscle weakness (generalized): Secondary | ICD-10-CM

## 2018-11-03 NOTE — Therapy (Signed)
[facility] [address], [place], [place], [address] Phone: [phone]   Fax:  [phone]  Physical Therapy Treatment  Patient Details  Name: Bonnie Conrad MRN: [id number] Date of Birth: 02-Jun-1970 Referring Provider (PT): Clovis Riley, MD   Encounter Date: 11/03/2018  PT End of Session - 11/03/18 1537    Visit Number  8    Date for PT Re-Evaluation  11/11/18    Authorization Type  medicaid    Authorization - Visit Number  3    Authorization - Number of Visits  8    PT Start Time  8756    PT Stop Time  1615    PT Time Calculation (min)  41 min    Activity Tolerance  Patient tolerated treatment well    Behavior During Therapy  [facility] for tasks assessed/performed       Past Medical History:  Diagnosis Date  . Abdominal hernia   . Anemia   . Anxiety   . Arthritis   . Blood transfusion without reported diagnosis   . Depression    hosp during pregnancy at [facility] health  . Hernia of abdominal cavity 2009  . Ovarian cyst     Past Surgical History:  Procedure Laterality Date  . COLONOSCOPY WITH ESOPHAGOGASTRODUODENOSCOPY (EGD)    . DILATION AND CURETTAGE OF UTERUS    . EPIGASTRIC HERNIA REPAIR N/A 06/05/2018   Procedure: OPEN REPAIR OF INCARCERATED EPIGASTRIC HERNIA WITH MESH;  Surgeon: Clovis Riley, MD;  Location: [facility];  Service: General;  Laterality: N/A;  . ESOPHAGEAL MANOMETRY N/A 06/29/2018   Procedure: ESOPHAGEAL MANOMETRY (EM);  Surgeon: Mauri Pole, MD;  [facility];  Service: Endoscopy;  Laterality: N/A;  . TUBAL LIGATION  07/25/2011   Procedure: POST PARTUM TUBAL LIGATION;  Surgeon: Mora Bellman, MD;  Location: [facility];  Service: Gynecology;  Laterality: Bilateral;    There were no vitals filed for this visit.  Subjective Assessment - 11/03/18 1537    Subjective  At home I am doing good    Patient is accompained by:  Interpreter    Currently in Pain?  Yes     Pain Score  4     Pain Location  Abdomen    Multiple Pain Sites  No                       [facility] Adult PT Treatment/Exercise - 11/03/18 0001      Self-Care   Self-Care  Other Self-Care Comments    Other Self-Care Comments   reviewed HEP and exercises to add to HEP; educated and performed self massage to back against the wall      Lumbar Exercises: Standing   Other Standing Lumbar Exercises  forward flexion on table to 90 deg for modified plank - TrA contract and breathing for hip and pelvic floor stretch and relax - 10x 5 sec    Other Standing Lumbar Exercises  wall plank - arm reaches      Manual Therapy   Myofascial Release  bilateral thoracolumbar fascia in seated position               PT Short Term Goals - 09/16/18 1350      PT SHORT TERM GOAL #1   Title  pt will be able to do LE MMT in sitting due to improved core strength    Baseline  5/5 knee extension, hip flexion 4-/5 with increased  pain on left side      PT SHORT TERM GOAL #2   Title  pt will report 25% less pain    Baseline       Status  Achieved        PT Long Term Goals - 11/03/18 1621      PT LONG TERM GOAL #1   Title  Pt will report at least 50% less pain on a typical day    Status  Achieved      PT LONG TERM GOAL #2   Title  Pt will demonstrate ability to engage abdominal muscles and in order to create tension throughout abdomen and maintain during rolling in bed    Status  Achieved      PT LONG TERM GOAL #3   Title  Pt will be ind with advanced HEP in order to maintain strength for improved function    Status  Achieved      PT LONG TERM GOAL #4   Title  Pt will be able to stand and walk for at least 30 minutes at a time so she can prepare meals and clean her home    Status  Achieved            Plan - 11/03/18 1619    Clinical Impression Statement  Pt has made excellent progress.  She was able to walk for one hour last weekend.  She is still getting a lot of pain  when she does too much, but she has the tools she needs to continue at home and exercises to continue to improve her core strength.  She will discharge with HEP today.    PT Treatment/Interventions  ADLs/Self Care Home Management;Cryotherapy;Electrical Stimulation;Biofeedback;Moist Heat;Ultrasound;Stair training;Therapeutic activities;Therapeutic exercise;Gait training;Neuromuscular re-education;Patient/family education;Manual techniques;Passive range of motion;Dry needling;Taping    PT Next Visit Plan  discharged today    Consulted and Agree with Plan of Care  Patient       Patient will benefit from skilled therapeutic intervention in order to improve the following deficits and impairments:  Pain, Increased fascial restricitons, Decreased coordination, Decreased range of motion, Decreased strength, Difficulty walking, Impaired flexibility, Postural dysfunction  Visit Diagnosis: 1. Chronic bilateral low back pain without sciatica   2. Muscle weakness (generalized)        Problem List Patient Active Problem List   Diagnosis Date Noted  . Hiatal hernia 08/15/2018  . History of hernia repair 08/15/2018  . Pain at surgical site 08/15/2018  . Iron deficiency anemia 06/02/2018  . Dysphagia 06/02/2018  . History of colonic polyps 06/02/2018  . Ventral hernia without obstruction or gangrene 06/02/2018  . Abnormal CT of the abdomen 03/09/2018  . Non-intractable vomiting 03/09/2018  . Chronic generalized abdominal pain 03/09/2018  . Gastroesophageal reflux disease 03/09/2018  . Rectal bleeding 03/09/2018  . Hemorrhoids 03/09/2018  . Major depressive disorder, recurrent episode, severe, with psychotic behavior ([place]) 04/03/2011  . Severe major depression with psychotic features ([place]) 03/06/2011  . Hernia of abdominal cavity 03/06/2011    Jule Ser, PT 11/03/2018, 4:26 PM  [facility] [address], [place], [place],  [address] Phone: [phone]   Fax:  [phone]  Name: Bonnie Conrad MRN: [id number] Date of Birth: 1970/04/16  PHYSICAL THERAPY DISCHARGE SUMMARY  Visits from Start of Care: 8  Current functional level related to goals / functional outcomes: See above goals   Remaining deficits: See above   Education / Equipment: HEP  Plan:  Patient agrees to discharge.  Patient goals were met. Patient is being discharged due to meeting the stated rehab goals.  ?????     [employer], PT 11/03/18 4:27 PM

## 2019-04-13 ENCOUNTER — Ambulatory Visit (HOSPITAL_COMMUNITY)
Admission: EM | Admit: 2019-04-13 | Discharge: 2019-04-13 | Disposition: A | Discharge status: Home / Self Care | Payer: Medicaid Other | Attending: Family Medicine | Admitting: Family Medicine

## 2019-04-13 ENCOUNTER — Encounter (HOSPITAL_COMMUNITY): Payer: Self-pay

## 2019-04-13 ENCOUNTER — Other Ambulatory Visit: Payer: Self-pay

## 2019-04-13 DIAGNOSIS — N939 Abnormal uterine and vaginal bleeding, unspecified: Secondary | ICD-10-CM

## 2019-04-13 LAB — POCT URINALYSIS DIP (DEVICE)
Bilirubin Urine: NEGATIVE
Glucose, UA: NEGATIVE mg/dL
Ketones, ur: NEGATIVE mg/dL
Leukocytes,Ua: NEGATIVE
Nitrite: NEGATIVE
Protein, ur: NEGATIVE mg/dL
Specific Gravity, Urine: 1.015 (ref 1.005–1.030)
Urobilinogen, UA: 0.2 mg/dL (ref 0.0–1.0)
pH: 7 (ref 5.0–8.0)

## 2019-04-13 MED ORDER — IBUPROFEN 600 MG PO TABS: 600.0000 mg | ORAL_TABLET | Freq: Four times a day (QID) | ORAL | 0 refills | Status: DC | PRN

## 2019-04-13 NOTE — Discharge Instructions (Signed)
Your pregnancy test and urine were negative for infection and pregnancy. There could be multiple things going on to include ovarian cyst which you have had before, a really bad and heavy menstrual cycle with heavy menstrual cramps, uterine fibroid You need to follow-up with OB/GYN as soon as possible.  If your pain worsens or bleeding worsen she will need to go to the ER. Can take ibuprofen every 8 hours for your pain

## 2019-04-13 NOTE — ED Triage Notes (Signed)
Pt states she ha vaginal bleeding x 4 days. Pt states she has a headache as well.

## 2019-04-14 ENCOUNTER — Encounter (HOSPITAL_COMMUNITY): Payer: Self-pay | Admitting: Emergency Medicine

## 2019-04-14 ENCOUNTER — Emergency Department (HOSPITAL_COMMUNITY): Payer: Medicaid Other

## 2019-04-14 ENCOUNTER — Emergency Department (HOSPITAL_COMMUNITY)
Admission: EM | Admit: 2019-04-14 | Discharge: 2019-04-14 | Disposition: A | Discharge status: Home / Self Care | Payer: Medicaid Other | Attending: Emergency Medicine | Admitting: Emergency Medicine

## 2019-04-14 ENCOUNTER — Other Ambulatory Visit: Payer: Self-pay

## 2019-04-14 DIAGNOSIS — N888 Other specified noninflammatory disorders of cervix uteri: Secondary | ICD-10-CM | POA: Diagnosis not present

## 2019-04-14 DIAGNOSIS — N939 Abnormal uterine and vaginal bleeding, unspecified: Secondary | ICD-10-CM | POA: Insufficient documentation

## 2019-04-14 DIAGNOSIS — R1030 Lower abdominal pain, unspecified: Secondary | ICD-10-CM | POA: Diagnosis not present

## 2019-04-14 DIAGNOSIS — Z79899 Other long term (current) drug therapy: Secondary | ICD-10-CM | POA: Insufficient documentation

## 2019-04-14 DIAGNOSIS — F17200 Nicotine dependence, unspecified, uncomplicated: Secondary | ICD-10-CM | POA: Diagnosis not present

## 2019-04-14 DIAGNOSIS — R300 Dysuria: Secondary | ICD-10-CM | POA: Diagnosis not present

## 2019-04-14 LAB — CBC
HCT: 41.1 % (ref 36.0–46.0)
Hemoglobin: 13.3 g/dL (ref 12.0–15.0)
MCH: 30.7 pg (ref 26.0–34.0)
MCHC: 32.4 g/dL (ref 30.0–36.0)
MCV: 94.9 fL (ref 80.0–100.0)
Platelets: 240 10*3/uL (ref 150–400)
RBC: 4.33 MIL/uL (ref 3.87–5.11)
RDW: 13.6 % (ref 11.5–15.5)
WBC: 8.8 10*3/uL (ref 4.0–10.5)
nRBC: 0 % (ref 0.0–0.2)

## 2019-04-14 LAB — COMPREHENSIVE METABOLIC PANEL
ALT: 18 U/L (ref 0–44)
AST: 20 U/L (ref 15–41)
Albumin: 4.4 g/dL (ref 3.5–5.0)
Alkaline Phosphatase: 53 U/L (ref 38–126)
Anion gap: 9 (ref 5–15)
BUN: 14 mg/dL (ref 6–20)
CO2: 23 mmol/L (ref 22–32)
Calcium: 9.2 mg/dL (ref 8.9–10.3)
Chloride: 105 mmol/L (ref 98–111)
Creatinine, Ser: 0.87 mg/dL (ref 0.44–1.00)
GFR calc Af Amer: >60 mL/min (ref >60)
GFR calc non Af Amer: >60 mL/min (ref >60)
Glucose, Bld: 99 mg/dL (ref 70–99)
Potassium: 3.6 mmol/L (ref 3.5–5.1)
Sodium: 137 mmol/L (ref 135–145)
Total Bilirubin: 1 mg/dL (ref 0.3–1.2)
Total Protein: 7.2 g/dL (ref 6.5–8.1)

## 2019-04-14 LAB — LIPASE, BLOOD: Lipase: 24 U/L (ref 11–51)

## 2019-04-14 LAB — URINALYSIS, ROUTINE W REFLEX MICROSCOPIC
Bacteria, UA: NONE SEEN
Bilirubin Urine: NEGATIVE
Glucose, UA: NEGATIVE mg/dL
Ketones, ur: 20 mg/dL — AB
Leukocytes,Ua: NEGATIVE
Nitrite: NEGATIVE
Protein, ur: NEGATIVE mg/dL
Specific Gravity, Urine: 1.017 (ref 1.005–1.030)
pH: 6 (ref 5.0–8.0)

## 2019-04-14 LAB — I-STAT BETA HCG BLOOD, ED (MC, WL, AP ONLY): I-stat hCG, quantitative: <5 m[IU]/mL (ref <5)

## 2019-04-14 LAB — WET PREP, GENITAL
Clue Cells Wet Prep HPF POC: NONE SEEN
Sperm: NONE SEEN
Trich, Wet Prep: NONE SEEN
Yeast Wet Prep HPF POC: NONE SEEN

## 2019-04-14 MED ORDER — ONDANSETRON 4 MG PO TBDP
4.0000 mg | ORAL_TABLET | Freq: Once | ORAL | Status: AC
  Administered 2019-04-14: 4 mg via ORAL
  Filled 2019-04-14: qty 1

## 2019-04-14 MED ORDER — DIPHENHYDRAMINE HCL 25 MG PO CAPS
25.0000 mg | ORAL_CAPSULE | Freq: Once | ORAL | Status: AC
  Administered 2019-04-14: 25 mg via ORAL
  Filled 2019-04-14: qty 1

## 2019-04-14 MED ORDER — SUCRALFATE 1 G PO TABS: 1.0000 g | ORAL_TABLET | Freq: Three times a day (TID) | ORAL | 0 refills | Status: DC

## 2019-04-14 MED ORDER — IBUPROFEN 400 MG PO TABS
600.0000 mg | ORAL_TABLET | Freq: Once | ORAL | Status: AC
  Administered 2019-04-14: 600 mg via ORAL
  Filled 2019-04-14: qty 1

## 2019-04-14 MED ORDER — FAMOTIDINE 40 MG PO TABS: 40.0000 mg | ORAL_TABLET | Freq: Every day | ORAL | 0 refills | Status: DC

## 2019-04-14 MED ORDER — IBUPROFEN 600 MG PO TABS: 600.0000 mg | ORAL_TABLET | Freq: Four times a day (QID) | ORAL | 0 refills | Status: DC | PRN

## 2019-04-14 MED ORDER — METOCLOPRAMIDE HCL 10 MG PO TABS
10.0000 mg | ORAL_TABLET | Freq: Once | ORAL | Status: AC
  Administered 2019-04-14: 10 mg via ORAL
  Filled 2019-04-14: qty 1

## 2019-04-14 NOTE — ED Notes (Signed)
Pt transported to ultrasound.

## 2019-04-14 NOTE — Discharge Instructions (Addendum)
Please call tomorrow to make an appointment with OBGYN. I have given you the phone number to call. Your ultrasound today is normal however you may require additional testing and evaluation with the OBGYN. Additionally your blood cell counts were reassuringly normal.   Please take sucralfate and pepcid as prescribed.   Please return to ED if you have any new or worsening symptoms.

## 2019-04-14 NOTE — ED Provider Notes (Signed)
[facility] EMERGENCY DEPARTMENT Provider Note   CSN: [id number] Arrival date & time: 04/14/19  1428     History Chief Complaint  Patient presents with  . Vaginal Bleeding  . Abdominal Pain    Bonnie Conrad is a 48 y.o. female.  Entirety of patient encounter was conducted with translator Bonnie Conrad [phone]  HPI  Patient is a 48 year old female with a history of abdominal hernia status post repair, 6 vaginal deliveries, ovarian cyst presented today with lower abdominal pain and vaginal bleeding that began 3 days ago days ago.  Patient describes pain as crampy, severe, nonradiating, constant, worse when moving around.  Associated with significant vaginal bleeding that patient states is with large clots.  Patient denies any sexually transmitted disease exposure.  Endorses some dysuria but denies frequency, urgency.   Patient also endorses a headache that is bitemporal and throbbing in nature with no associated vision loss or vision changes, no weakness or sensation changes.  Patient states that she had a similar headache proximately 1 week ago.  Patient has any changes with her bowel movements.  Endorses mild nausea but states her last bowel movement was this morning.  Has been passing flatus.  Denies any blood in her stool.  Denies any constipation or diarrhea.     Past Medical History:  Diagnosis Date  . Abdominal hernia   . Anemia   . Anxiety   . Arthritis   . Blood transfusion without reported diagnosis   . Depression    hosp during pregnancy at [facility]  . Hernia of abdominal cavity 2009  . Ovarian cyst     Patient Active Problem List   Diagnosis Date Noted  . Hiatal hernia 08/15/2018  . History of hernia repair 08/15/2018  . Pain at surgical site 08/15/2018  . Iron deficiency anemia 06/02/2018  . Dysphagia 06/02/2018  . History of colonic polyps 06/02/2018  . Ventral hernia without obstruction or gangrene 06/02/2018  . Abnormal CT  of the abdomen 03/09/2018  . Non-intractable vomiting 03/09/2018  . Chronic generalized abdominal pain 03/09/2018  . Gastroesophageal reflux disease 03/09/2018  . Rectal bleeding 03/09/2018  . Hemorrhoids 03/09/2018  . Major depressive disorder, recurrent episode, severe, with psychotic behavior ([place]) 04/03/2011  . Severe major depression with psychotic features ([place]) 03/06/2011  . Hernia of abdominal cavity 03/06/2011    Past Surgical History:  Procedure Laterality Date  . COLONOSCOPY WITH ESOPHAGOGASTRODUODENOSCOPY (EGD)    . DILATION AND CURETTAGE OF UTERUS    . EPIGASTRIC HERNIA REPAIR N/A 06/05/2018   Procedure: OPEN REPAIR OF INCARCERATED EPIGASTRIC HERNIA WITH MESH;  Surgeon: Clovis Riley, MD;  Location: [facility];  Service: General;  Laterality: N/A;  . ESOPHAGEAL MANOMETRY N/A 06/29/2018   Procedure: ESOPHAGEAL MANOMETRY (EM);  Surgeon: Mauri Pole, MD;  Location: [facility];  Service: Endoscopy;  Laterality: N/A;  . TUBAL LIGATION  07/25/2011   Procedure: POST PARTUM TUBAL LIGATION;  Surgeon: Mora Bellman, MD;  Location: [facility];  Service: Gynecology;  Laterality: Bilateral;     OB History    Gravida  9   Para  6   Term  6   Preterm  0   AB  3   Living  6     SAB  3   TAB  0   Ectopic  0   Multiple  0   Live Births  5           Family History  Problem Relation Age  of Onset  . Hypertension Mother   . Anesthesia problems Neg Hx   . Hypotension Neg Hx   . Malignant hyperthermia Neg Hx   . Pseudochol deficiency Neg Hx   . Colon cancer Neg Hx   . Esophageal cancer Neg Hx   . Inflammatory bowel disease Neg Hx   . Liver disease Neg Hx   . Pancreatic cancer Neg Hx   . Rectal cancer Neg Hx   . Stomach cancer Neg Hx   . Colon polyps Neg Hx     Social History   Tobacco Use  . Smoking status: Current Some Day Smoker  . Smokeless tobacco: Never Used  . Tobacco comment: a few months ago  Substance Use Topics  . Alcohol use: No  . Drug  use: No    Home Medications Prior to Admission medications   Medication Sig Start Date End Date Taking? Authorizing Provider  dicyclomine (BENTYL) 10 MG capsule Take 1 capsule (10 mg total) by mouth 4 (four) times daily -  before meals and at bedtime. To relieve abdominal pain 09/30/18   Mansouraty, Telford Nab., MD  famotidine (PEPCID) 40 MG tablet Take 1 tablet (40 mg total) by mouth daily for 14 days. 04/14/19 04/28/19  Tedd Sias, PA  ferrous gluconate (FERGON) 324 MG tablet Take 1 tablet (324 mg total) by mouth daily with breakfast for 30 days. 08/11/18 09/10/18  Mansouraty, Telford Nab., MD  fluticasone (FLONASE) 50 MCG/ACT nasal spray Place 1 spray into both nostrils daily. 06/20/18   Langston Masker B, PA-C  ibuprofen (ADVIL) 600 MG tablet Take 1 tablet (600 mg total) by mouth every 6 (six) hours as needed. 04/14/19   Tedd Sias, PA  omeprazole (PRILOSEC) 40 MG capsule Take 1 capsule (40 mg total) by mouth 2 (two) times daily. 08/11/18   Mansouraty, Telford Nab., MD  sucralfate (CARAFATE) 1 g tablet Take 1 tablet (1 g total) by mouth 4 (four) times daily -  with meals and at bedtime for 14 days. 04/14/19 04/28/19  Tedd Sias, PA  traMADol (ULTRAM) 50 MG tablet Take 1 tablet (50 mg total) by mouth every 6 (six) hours as needed. Patient taking differently: Take 50 mg by mouth every 6 (six) hours as needed for moderate pain.  06/05/18 06/05/19  Clovis Riley, MD    Allergies    Penicillins, Morphine and related, and Vicodin [hydrocodone-acetaminophen]  Review of Systems   Review of Systems  Physical Exam Updated Vital Signs BP 115/60   Pulse 99   Temp 99 F (37.2 C) (Oral)   Resp 18   LMP 04/13/2019   SpO2 99%   Physical Exam Vitals and nursing note reviewed.  Constitutional:      General: She is not in acute distress. HENT:     Head: Normocephalic and atraumatic.     Nose: Nose normal.     Mouth/Throat:     Mouth: Mucous membranes are moist.  Eyes:      General: No scleral icterus. Cardiovascular:     Rate and Rhythm: Normal rate and regular rhythm.     Pulses: Normal pulses.     Heart sounds: Normal heart sounds.  Pulmonary:     Effort: Pulmonary effort is normal. No respiratory distress.     Breath sounds: No wheezing.  Abdominal:     Palpations: Abdomen is soft.     Tenderness: There is abdominal tenderness (Bilateral lower quadrant tenderness with some suprapubic tenderness.  Patient voluntarily guarding  with left lower quadrant palpation.). There is guarding.  Genitourinary:    General: Normal vulva.     Comments: Vulva without lesions or abnormality Vaginal canal without abnormal discharge or lesion.  There is blood pooling in the vaginal canal. Cervix appears normal, is closed Significant left adnexal tenderness.  No CMT.  Right adnexa mildly tender to palpation. Musculoskeletal:     Cervical back: Normal range of motion.     Right lower leg: No edema.     Left lower leg: No edema.  Skin:    General: Skin is warm and dry.     Capillary Refill: Capillary refill takes less than 2 seconds.  Neurological:     Mental Status: She is alert. Mental status is at baseline.  Psychiatric:        Mood and Affect: Mood normal.        Behavior: Behavior normal.     ED Results / Procedures / Treatments   Labs (all labs ordered are listed, but only abnormal results are displayed) Labs Reviewed  WET PREP, GENITAL - Abnormal; Notable for the following components:      Result Value   WBC, Wet Prep HPF POC MODERATE (*)    All other components within normal limits  URINALYSIS, ROUTINE W REFLEX MICROSCOPIC - Abnormal; Notable for the following components:   Hgb urine dipstick LARGE (*)    Ketones, ur 20 (*)    All other components within normal limits  LIPASE, BLOOD  COMPREHENSIVE METABOLIC PANEL  CBC  I-STAT BETA HCG BLOOD, ED ([facility], [facility], [facility] ONLY)  GC/CHLAMYDIA PROBE AMP ([place]) NOT AT [facility]    EKG None  Radiology US  Transvaginal Non-OB  Result Date: 04/14/2019 CLINICAL DATA:  Initial evaluation for acute lower abdominal pain. EXAM: TRANSABDOMINAL AND TRANSVAGINAL ULTRASOUND OF PELVIS DOPPLER ULTRASOUND OF OVARIES TECHNIQUE: Both transabdominal and transvaginal ultrasound examinations of the pelvis were performed. Transabdominal technique was performed for global imaging of the pelvis including uterus, ovaries, adnexal regions, and pelvic cul-de-sac. It was necessary to proceed with endovaginal exam following the transabdominal exam to visualize the uterus, endometrium, and ovaries. Color and duplex Doppler ultrasound was utilized to evaluate blood flow to the ovaries. COMPARISON:  Prior CT from 12/16/2017. FINDINGS: Uterus Measurements: 9.6 x 4.7 x 5.7 cm = volume: 131.7 mL. No fibroids or other mass visualized. Few small simple nabothian cysts noted clustered about the cervix. Endometrium Thickness: 10 mm.  No focal abnormality visualized. Right ovary Measurements: 2.7 x 2.1 x 2.8 cm = volume: 8.5 mL. Normal appearance/no adnexal mass. Left ovary Measurements: 2.7 x 1.6 x 2.3 cm = volume: 5.1 mL. Normal appearance/no adnexal mass. Pulsed Doppler evaluation of both ovaries demonstrates normal low-resistance arterial and venous waveforms. Other findings Trace free physiologic fluid present within the pelvis. IMPRESSION: Normal pelvic ultrasound. No evidence for torsion or other acute abnormality. Electronically Signed   By: Jeannine Boga M.D.   On: 04/14/2019 19:23   US Pelvis Complete  Result Date: 04/14/2019 CLINICAL DATA:  Initial evaluation for acute lower abdominal pain. EXAM: TRANSABDOMINAL AND TRANSVAGINAL ULTRASOUND OF PELVIS DOPPLER ULTRASOUND OF OVARIES TECHNIQUE: Both transabdominal and transvaginal ultrasound examinations of the pelvis were performed. Transabdominal technique was performed for global imaging of the pelvis including uterus, ovaries, adnexal regions, and pelvic cul-de-sac. It was  necessary to proceed with endovaginal exam following the transabdominal exam to visualize the uterus, endometrium, and ovaries. Color and duplex Doppler ultrasound was utilized to evaluate blood flow to the ovaries.  COMPARISON:  Prior CT from 12/16/2017. FINDINGS: Uterus Measurements: 9.6 x 4.7 x 5.7 cm = volume: 131.7 mL. No fibroids or other mass visualized. Few small simple nabothian cysts noted clustered about the cervix. Endometrium Thickness: 10 mm.  No focal abnormality visualized. Right ovary Measurements: 2.7 x 2.1 x 2.8 cm = volume: 8.5 mL. Normal appearance/no adnexal mass. Left ovary Measurements: 2.7 x 1.6 x 2.3 cm = volume: 5.1 mL. Normal appearance/no adnexal mass. Pulsed Doppler evaluation of both ovaries demonstrates normal low-resistance arterial and venous waveforms. Other findings Trace free physiologic fluid present within the pelvis. IMPRESSION: Normal pelvic ultrasound. No evidence for torsion or other acute abnormality. Electronically Signed   By: Jeannine Boga M.D.   On: 04/14/2019 19:23   [place] Art/Ven Flow Abd Pelv Doppler  Result Date: 04/14/2019 CLINICAL DATA:  Initial evaluation for acute lower abdominal pain. EXAM: TRANSABDOMINAL AND TRANSVAGINAL ULTRASOUND OF PELVIS DOPPLER ULTRASOUND OF OVARIES TECHNIQUE: Both transabdominal and transvaginal ultrasound examinations of the pelvis were performed. Transabdominal technique was performed for global imaging of the pelvis including uterus, ovaries, adnexal regions, and pelvic cul-de-sac. It was necessary to proceed with endovaginal exam following the transabdominal exam to visualize the uterus, endometrium, and ovaries. Color and duplex Doppler ultrasound was utilized to evaluate blood flow to the ovaries. COMPARISON:  Prior CT from 12/16/2017. FINDINGS: Uterus Measurements: 9.6 x 4.7 x 5.7 cm = volume: 131.7 mL. No fibroids or other mass visualized. Few small simple nabothian cysts noted clustered about the cervix. Endometrium  Thickness: 10 mm.  No focal abnormality visualized. Right ovary Measurements: 2.7 x 2.1 x 2.8 cm = volume: 8.5 mL. Normal appearance/no adnexal mass. Left ovary Measurements: 2.7 x 1.6 x 2.3 cm = volume: 5.1 mL. Normal appearance/no adnexal mass. Pulsed Doppler evaluation of both ovaries demonstrates normal low-resistance arterial and venous waveforms. Other findings Trace free physiologic fluid present within the pelvis. IMPRESSION: Normal pelvic ultrasound. No evidence for torsion or other acute abnormality. Electronically Signed   By: Jeannine Boga M.D.   On: 04/14/2019 19:23    Procedures Procedures (including critical care time)  Medications Ordered in ED Medications  ondansetron (ZOFRAN-ODT) disintegrating tablet 4 mg (4 mg Oral Given 04/14/19 1928)  metoCLOPramide (REGLAN) tablet 10 mg (10 mg Oral Given 04/14/19 1927)  diphenhydrAMINE (BENADRYL) capsule 25 mg (25 mg Oral Given 04/14/19 1928)  ibuprofen (ADVIL) tablet 600 mg (600 mg Oral Given 04/14/19 1927)    ED Course  I have reviewed the triage vital signs and the nursing notes.  Pertinent labs & imaging results that were available during my care of the patient were reviewed by me and considered in my medical decision making (see chart for details).  Clinical Course as of Apr 14 2103  Wed Apr 14, 2019  1845 CBC without leukocytosis or anemia  CBC [[facility]]  1845 Lipase within normal limits.  Lipase, blood [[facility]]  1845 Not pregnant  I-Stat beta hCG blood, ED [[facility]]  1845 CMP without any electrolyte abnormalities  Comprehensive metabolic panel [[facility]]    Clinical Course User Index [[facility]] Tedd Sias, PA   MDM Rules/Calculators/A&P                       Patient is a 48 year old female with a surgical history of abdominal hernia repair, 6 vaginal deliveries, and hx of ovarian cyst presented today with lower abdominal pain and vaginal bleeding that began 3 days ago days ago.   Also endorses headache that is  throbbing,  bitemporal without any neurological associated symptoms.  Patient with notable left lower abdominal/pelvic tenderness to palpation.  On pelvic exam there is significant left adnexal tenderness to palpation.  Palpable mass on my exam.  Concern for left ovarian cyst versus ovarian torsion versus ectopic pregnancy versus ovarian teratoma versus ovarian cyst rupture versus less likely diverticulitis or constipation.  Will obtain ultrasound of pelvis as she has tenderness on pelvic exam.  No CMT to indicate pelvic inflammatory disease.  Prep is without any evidence of infection.  Urinalysis with large blood likely due to vaginal bleeding.  No leukocytosis or evidence infection electrolytes are all within normal limits.  Pelvic ultrasound without any evidence of torsion and no cyst.  There is some small amount of free fluid in the pelvis likely physiologic.  As ultrasound has ruled out torsion, ovarian cyst, and negative blood pregnancy tests makes ectopic very unlikely suspect that this is dysfunctional uterine bleeding.  Patient states last menstrual period was completed approximately a week ago.  Uncertain of why she is having vaginal bleeding at this time.  As bleeding has dramatically improved on my reassessment patient states her headache is improved with Benadryl and Reglan and ibuprofen I suspect that she is stable for discharge at this time.  She is hemodynamically stable not tachycardic and states her pain is moderately improved.  Gave zofran for nausea and reglan / benadryl PO for headache w/ ibuprofen.   8:28 PM abdominal exam much improved.  No tenderness to palpation currently.  Patient's bleeding has improved as well.  Hematocrit is within normal limits.  Blood pressure and pulse within normal limits.  Discussed with patient that acute emergent causes of her symptoms are unlikely but that I am willing to order a CT scan of her abdomen at this time.  Patient states that she would prefer to be  discharged home as her symptoms are improved.  She will return to ED if symptoms worsen or continue.  She states she will follow up with the OB/GYN.  I gave her permission for them.  Prescribed ibuprofen with sucralfate and Pepcid.  Patient does have some crampy abdominal pain with eating which I suspect may be related to some gastritis.  Recommended she use ibuprofen as needed for pain as she is allergic to Tylenol.  Uncertain etiology of patient's bleeding.  May be a ruptured cyst that is no longer obvious on ultrasound.  She will follow with OB/GYN.  This patient appears reasonably screened and I doubt any other medical condition requiring further workup, evaluation, or treatment in the ED at this time prior to discharge.   Patient's vitals are WNL apart from vital sign abnormalities discussed above, patient is in NAD, and able to ambulate in the ED at their baseline. Pain has been managed or a plan has been made for home management and has no complaints prior to discharge. Patient is comfortable with above plan and is stable for discharge at this time. All questions were answered prior to disposition. Results from the ER workup discussed with the patient face to face and all questions answered to the best of my ability. The patient is safe for discharge with strict return precautions. Patient appears safe for discharge with appropriate follow-up. Conveyed my impression with the patient and they voiced understanding and are agreeable to plan.   An After Visit Summary was printed and given to the patient.  Portions of this note were generated with [employer]. Dictation errors may occur  despite best attempts at proofreading.   Akaysha Vincenta Szoke was evaluated in Emergency Department on 04/14/2019 for the symptoms described in the history of present illness. She was evaluated in the context of the global COVID-19 pandemic, which necessitated consideration that the patient might be  at risk for infection with the SARS-CoV-2 virus that causes COVID-19. Institutional protocols and algorithms that pertain to the evaluation of patients at risk for COVID-19 are in a state of rapid change based on information released by regulatory bodies including the CDC and federal and state organizations. These policies and algorithms were followed during the patient's care in the ED.    Final Clinical Impression(s) / ED Diagnoses Final diagnoses:  Vaginal bleeding  Lower abdominal pain    Rx / DC Orders ED Discharge Orders         Ordered    ibuprofen (ADVIL) 600 MG tablet  Every 6 hours PRN     04/14/19 2042    sucralfate (CARAFATE) 1 g tablet  3 times daily with meals & bedtime     04/14/19 2042    famotidine (PEPCID) 40 MG tablet  Daily     04/14/19 2042           Pati Gallo [place], [place] 04/14/19 2105    Quintella Reichert, MD 04/15/19 [phone]

## 2019-04-14 NOTE — ED Provider Notes (Addendum)
[place]    CSN: [id number] Arrival date & time: 04/13/19  1243      History   Chief Complaint Chief Complaint  Patient presents with  . Vaginal Bleeding    HPI Bonnie Conrad is a 48 y.o. female.   Patient is a 48 year old female with past medical history of anemia, anxiety, arthritis, depression, ovarian cyst.  She presents today with approximately 4 days of vaginal bleeding.  She is still having menstrual cycles.  Reporting this cycle is heavier and longer than normal.  Reporting large clots at times.  She is also had mild diffuse headache without any dizziness or vision changes.  Having some lower abdominal discomfort.  Denies any vaginal discharge, itching or irritation.  Denies any dysuria, hematuria or urinary frequency.  No fever.   ROS per HPI    Vaginal Bleeding   Past Medical History:  Diagnosis Date  . Abdominal hernia   . Anemia   . Anxiety   . Arthritis   . Blood transfusion without reported diagnosis   . Depression    hosp during pregnancy at [facility] health  . Hernia of abdominal cavity 2009  . Ovarian cyst     Patient Active Problem List   Diagnosis Date Noted  . Hiatal hernia 08/15/2018  . History of hernia repair 08/15/2018  . Pain at surgical site 08/15/2018  . Iron deficiency anemia 06/02/2018  . Dysphagia 06/02/2018  . History of colonic polyps 06/02/2018  . Ventral hernia without obstruction or gangrene 06/02/2018  . Abnormal CT of the abdomen 03/09/2018  . Non-intractable vomiting 03/09/2018  . Chronic generalized abdominal pain 03/09/2018  . Gastroesophageal reflux disease 03/09/2018  . Rectal bleeding 03/09/2018  . Hemorrhoids 03/09/2018  . Major depressive disorder, recurrent episode, severe, with psychotic behavior ([place]) 04/03/2011  . Severe major depression with psychotic features ([place]) 03/06/2011  . Hernia of abdominal cavity 03/06/2011    Past Surgical History:  Procedure Laterality Date  .  COLONOSCOPY WITH ESOPHAGOGASTRODUODENOSCOPY (EGD)    . DILATION AND CURETTAGE OF UTERUS    . EPIGASTRIC HERNIA REPAIR N/A 06/05/2018   Procedure: OPEN REPAIR OF INCARCERATED EPIGASTRIC HERNIA WITH MESH;  Surgeon: Clovis Riley, MD;  Location: [facility];  Service: General;  Laterality: N/A;  . ESOPHAGEAL MANOMETRY N/A 06/29/2018   Procedure: ESOPHAGEAL MANOMETRY (EM);  Surgeon: Mauri Pole, MD;  Location: [facility];  Service: Endoscopy;  Laterality: N/A;  . TUBAL LIGATION  07/25/2011   Procedure: POST PARTUM TUBAL LIGATION;  Surgeon: Mora Bellman, MD;  Location: [facility];  Service: Gynecology;  Laterality: Bilateral;    OB History    Gravida  9   Para  6   Term  6   Preterm  0   AB  3   Living  6     SAB  3   TAB  0   Ectopic  0   Multiple  0   Live Births  5            Home Medications    Prior to Admission medications   Medication Sig Start Date End Date Taking? Authorizing Provider  dicyclomine (BENTYL) 10 MG capsule Take 1 capsule (10 mg total) by mouth 4 (four) times daily -  before meals and at bedtime. To relieve abdominal pain 09/30/18   Mansouraty, Telford Nab., MD  famotidine (PEPCID) 40 MG tablet Take 1 tablet (40 mg total) by mouth daily for 14 days. 04/14/19 04/28/19  Tedd Sias,  PA  ferrous gluconate (FERGON) 324 MG tablet Take 1 tablet (324 mg total) by mouth daily with breakfast for 30 days. 08/11/18 09/10/18  Mansouraty, Telford Nab., MD  fluticasone (FLONASE) 50 MCG/ACT nasal spray Place 1 spray into both nostrils daily. 06/20/18   Langston Masker B, PA-C  ibuprofen (ADVIL) 600 MG tablet Take 1 tablet (600 mg total) by mouth every 6 (six) hours as needed. 04/14/19   Tedd Sias, PA  omeprazole (PRILOSEC) 40 MG capsule Take 1 capsule (40 mg total) by mouth 2 (two) times daily. 08/11/18   Mansouraty, Telford Nab., MD  sucralfate (CARAFATE) 1 g tablet Take 1 tablet (1 g total) by mouth 4 (four) times daily -  with meals and at bedtime for 14  days. 04/14/19 04/28/19  Tedd Sias, PA  traMADol (ULTRAM) 50 MG tablet Take 1 tablet (50 mg total) by mouth every 6 (six) hours as needed. Patient taking differently: Take 50 mg by mouth every 6 (six) hours as needed for moderate pain.  06/05/18 06/05/19  Clovis Riley, MD    Family History Family History  Problem Relation Age of Onset  . Hypertension Mother   . Anesthesia problems Neg Hx   . Hypotension Neg Hx   . Malignant hyperthermia Neg Hx   . Pseudochol deficiency Neg Hx   . Colon cancer Neg Hx   . Esophageal cancer Neg Hx   . Inflammatory bowel disease Neg Hx   . Liver disease Neg Hx   . Pancreatic cancer Neg Hx   . Rectal cancer Neg Hx   . Stomach cancer Neg Hx   . Colon polyps Neg Hx     Social History Social History   Tobacco Use  . Smoking status: Current Some Day Smoker  . Smokeless tobacco: Never Used  . Tobacco comment: a few months ago  Substance Use Topics  . Alcohol use: No  . Drug use: No     Allergies   Penicillins, Morphine and related, and Vicodin [hydrocodone-acetaminophen]   Review of Systems Review of Systems  Genitourinary: Positive for vaginal bleeding.     Physical Exam Triage Vital Signs ED Triage Vitals  Enc Vitals Group     BP 04/13/19 1411 (!) 146/84     Pulse Rate 04/13/19 1411 68     Resp 04/13/19 1411 18     Temp 04/13/19 1411 98.1 F (36.7 C)     Temp Source 04/13/19 1411 Oral     SpO2 04/13/19 1411 100 %     Weight 04/13/19 1409 125 lb (56.7 kg)     Height --      Head Circumference --      Peak Flow --      Pain Score 04/13/19 1408 6     Pain Loc --      Pain Edu? --      Excl. in [place]? --    No data found.  Updated Vital Signs BP (!) 146/84 (BP Location: Right Arm)   Pulse 68   Temp 98.1 F (36.7 C) (Oral)   Resp 18   Wt 125 lb (56.7 kg)   LMP 04/13/2019   SpO2 100%   BMI 26.13 kg/m   Visual Acuity Right Eye Distance:   Left Eye Distance:   Bilateral Distance:    Right Eye Near:   Left  Eye Near:    Bilateral Near:     Physical Exam Vitals and nursing note reviewed.  Constitutional:  General: She is not in acute distress.    Appearance: Normal appearance. She is not ill-appearing, toxic-appearing or diaphoretic.  HENT:     Head: Normocephalic.     Nose: Nose normal.     Mouth/Throat:     Pharynx: Oropharynx is clear.  Eyes:     Conjunctiva/sclera: Conjunctivae normal.  Pulmonary:     Effort: Pulmonary effort is normal.  Abdominal:     General: Bowel sounds are normal.     Palpations: Abdomen is soft. There is no hepatomegaly or splenomegaly.     Tenderness: There is abdominal tenderness in the right lower quadrant and left lower quadrant. There is no guarding or rebound.     Hernia: No hernia is present.  Musculoskeletal:        General: Normal range of motion.     Cervical back: Normal range of motion.  Skin:    General: Skin is warm and dry.     Findings: No rash.  Neurological:     Mental Status: She is alert.  Psychiatric:        Mood and Affect: Mood normal.      UC Treatments / Results  Labs (all labs ordered are listed, but only abnormal results are displayed) Labs Reviewed  POCT URINALYSIS DIP (DEVICE) - Abnormal; Notable for the following components:      Result Value   Hgb urine dipstick MODERATE (*)    All other components within normal limits  POC URINE PREG, ED    EKG   Radiology US Transvaginal Non-OB  Result Date: 04/14/2019 CLINICAL DATA:  Initial evaluation for acute lower abdominal pain. EXAM: TRANSABDOMINAL AND TRANSVAGINAL ULTRASOUND OF PELVIS DOPPLER ULTRASOUND OF OVARIES TECHNIQUE: Both transabdominal and transvaginal ultrasound examinations of the pelvis were performed. Transabdominal technique was performed for global imaging of the pelvis including uterus, ovaries, adnexal regions, and pelvic cul-de-sac. It was necessary to proceed with endovaginal exam following the transabdominal exam to visualize the uterus,  endometrium, and ovaries. Color and duplex Doppler ultrasound was utilized to evaluate blood flow to the ovaries. COMPARISON:  Prior CT from 12/16/2017. FINDINGS: Uterus Measurements: 9.6 x 4.7 x 5.7 cm = volume: 131.7 mL. No fibroids or other mass visualized. Few small simple nabothian cysts noted clustered about the cervix. Endometrium Thickness: 10 mm.  No focal abnormality visualized. Right ovary Measurements: 2.7 x 2.1 x 2.8 cm = volume: 8.5 mL. Normal appearance/no adnexal mass. Left ovary Measurements: 2.7 x 1.6 x 2.3 cm = volume: 5.1 mL. Normal appearance/no adnexal mass. Pulsed Doppler evaluation of both ovaries demonstrates normal low-resistance arterial and venous waveforms. Other findings Trace free physiologic fluid present within the pelvis. IMPRESSION: Normal pelvic ultrasound. No evidence for torsion or other acute abnormality. Electronically Signed   By: Jeannine Boga M.D.   On: 04/14/2019 19:23   US Pelvis Complete  Result Date: 04/14/2019 CLINICAL DATA:  Initial evaluation for acute lower abdominal pain. EXAM: TRANSABDOMINAL AND TRANSVAGINAL ULTRASOUND OF PELVIS DOPPLER ULTRASOUND OF OVARIES TECHNIQUE: Both transabdominal and transvaginal ultrasound examinations of the pelvis were performed. Transabdominal technique was performed for global imaging of the pelvis including uterus, ovaries, adnexal regions, and pelvic cul-de-sac. It was necessary to proceed with endovaginal exam following the transabdominal exam to visualize the uterus, endometrium, and ovaries. Color and duplex Doppler ultrasound was utilized to evaluate blood flow to the ovaries. COMPARISON:  Prior CT from 12/16/2017. FINDINGS: Uterus Measurements: 9.6 x 4.7 x 5.7 cm = volume: 131.7 mL. No fibroids or other mass  visualized. Few small simple nabothian cysts noted clustered about the cervix. Endometrium Thickness: 10 mm.  No focal abnormality visualized. Right ovary Measurements: 2.7 x 2.1 x 2.8 cm = volume: 8.5 mL.  Normal appearance/no adnexal mass. Left ovary Measurements: 2.7 x 1.6 x 2.3 cm = volume: 5.1 mL. Normal appearance/no adnexal mass. Pulsed Doppler evaluation of both ovaries demonstrates normal low-resistance arterial and venous waveforms. Other findings Trace free physiologic fluid present within the pelvis. IMPRESSION: Normal pelvic ultrasound. No evidence for torsion or other acute abnormality. Electronically Signed   By: Jeannine Boga M.D.   On: 04/14/2019 19:23   [place] Art/Ven Flow Abd Pelv Doppler  Result Date: 04/14/2019 CLINICAL DATA:  Initial evaluation for acute lower abdominal pain. EXAM: TRANSABDOMINAL AND TRANSVAGINAL ULTRASOUND OF PELVIS DOPPLER ULTRASOUND OF OVARIES TECHNIQUE: Both transabdominal and transvaginal ultrasound examinations of the pelvis were performed. Transabdominal technique was performed for global imaging of the pelvis including uterus, ovaries, adnexal regions, and pelvic cul-de-sac. It was necessary to proceed with endovaginal exam following the transabdominal exam to visualize the uterus, endometrium, and ovaries. Color and duplex Doppler ultrasound was utilized to evaluate blood flow to the ovaries. COMPARISON:  Prior CT from 12/16/2017. FINDINGS: Uterus Measurements: 9.6 x 4.7 x 5.7 cm = volume: 131.7 mL. No fibroids or other mass visualized. Few small simple nabothian cysts noted clustered about the cervix. Endometrium Thickness: 10 mm.  No focal abnormality visualized. Right ovary Measurements: 2.7 x 2.1 x 2.8 cm = volume: 8.5 mL. Normal appearance/no adnexal mass. Left ovary Measurements: 2.7 x 1.6 x 2.3 cm = volume: 5.1 mL. Normal appearance/no adnexal mass. Pulsed Doppler evaluation of both ovaries demonstrates normal low-resistance arterial and venous waveforms. Other findings Trace free physiologic fluid present within the pelvis. IMPRESSION: Normal pelvic ultrasound. No evidence for torsion or other acute abnormality. Electronically Signed   By: Jeannine Boga M.D.   On: 04/14/2019 19:23    Procedures Procedures (including critical care time)  Medications Ordered in UC Medications - No data to display  Initial Impression / Assessment and Plan / UC Course  I have reviewed the triage vital signs and the nursing notes.  Pertinent labs & imaging results that were available during my care of the patient were reviewed by me and considered in my medical decision making (see chart for details).     Vaginal bleeding-differentials include ovarian cyst, uterine fibroid or just a heavy menstrual cycle with cramping.  Not concerned for tubal pregnancy based on negative pregnancy test. Pregnancy test negative here.  Hemoglobin and urine most likely from vaginal area.  Recommended follow-up with OB/GYN for further evaluation and management.  Also recommended that if symptoms worsen she will need to go to the ER for imaging.  Ibuprofen for pain  Final Clinical Impressions(s) / UC Diagnoses   Final diagnoses:  Vaginal bleeding     Discharge Instructions     Your pregnancy test and urine were negative for infection and pregnancy. There could be multiple things going on to include ovarian cyst which you have had before, a really bad and heavy menstrual cycle with heavy menstrual cramps, uterine fibroid You need to follow-up with OB/GYN as soon as possible.  If your pain worsens or bleeding worsen she will need to go to the ER. Can take ibuprofen every 8 hours for your pain    ED Prescriptions    Medication Sig Dispense Auth. Provider   ibuprofen (ADVIL) 600 MG tablet Take 1 tablet (600 mg total) by  mouth every 6 (six) hours as needed. 30 tablet Loura Halt A, NP     PDMP not reviewed this encounter.        Orvan July, NP 04/15/19 [phone]

## 2019-04-14 NOTE — ED Notes (Signed)
Pt ambulating to rest room.

## 2019-04-14 NOTE — ED Triage Notes (Signed)
Pt reports vaginal bleeding, HA and lower abd pain.

## 2019-04-19 LAB — GC/CHLAMYDIA PROBE AMP (~~LOC~~) NOT AT ARMC
Chlamydia: NEGATIVE
Neisseria Gonorrhea: NEGATIVE

## 2019-04-21 DIAGNOSIS — H2513 Age-related nuclear cataract, bilateral: Secondary | ICD-10-CM | POA: Diagnosis not present

## 2019-04-27 DIAGNOSIS — H5213 Myopia, bilateral: Secondary | ICD-10-CM | POA: Diagnosis not present

## 2019-04-28 ENCOUNTER — Encounter: Payer: Self-pay | Admitting: Obstetrics & Gynecology

## 2019-04-28 ENCOUNTER — Ambulatory Visit: Payer: Medicaid Other | Admitting: Obstetrics & Gynecology

## 2019-04-28 ENCOUNTER — Other Ambulatory Visit (HOSPITAL_COMMUNITY)
Admission: RE | Admit: 2019-04-28 | Discharge: 2019-04-28 | Disposition: A | Discharge status: Home / Self Care | Payer: Medicaid Other | Source: Ambulatory Visit | Attending: Obstetrics & Gynecology | Admitting: Obstetrics & Gynecology

## 2019-04-28 ENCOUNTER — Other Ambulatory Visit: Payer: Self-pay

## 2019-04-28 VITALS — BP 115/67 | HR 76 | Wt 107.0 lb

## 2019-04-28 DIAGNOSIS — R102 Pelvic and perineal pain: Secondary | ICD-10-CM

## 2019-04-28 DIAGNOSIS — Z01419 Encounter for gynecological examination (general) (routine) without abnormal findings: Secondary | ICD-10-CM

## 2019-04-28 NOTE — Patient Instructions (Signed)
Dolor plvico en la mujer Pelvic Pain, Female El dolor plvico se siente en la parte inferior del vientre (abdomen), debajo del ombligo y a nivel de las caderas. El dolor puede comenzar en forma repentina (ser [employer]), reaparecer (ser recurrente) o durar mucho tiempo (volverse crnico). El dolor plvico que dura ms de 6 meses se denomina dolor plvico crnico. El dolor plvico puede tener muchas causas. A veces, la causa del dolor plvico no se conoce. Siga estas indicaciones en su casa:   Tome los medicamentos de venta libre y los recetados solamente como se lo haya indicado el mdico.  Haga reposo como se lo haya indicado el mdico.  No tenga relaciones sexuales si siente dolor.  Lleve un registro del dolor plvico. Escriba los siguientes datos: ? Cundo Energy manager. ? La ubicacin del dolor. ? Qu parece mejorar o empeorar el dolor, como alimentos o el perodo (ciclo menstrual). ? Cualquier sntoma que se presente junto con Conservation officer, historic buildings.  Concurra a todas las visitas de control como se lo haya indicado el mdico. Esto es importante. Comunquese con un mdico si:  Los medicamentos no Forensic psychologist.  El dolor regresa.  Aparecen nuevos sntomas.  Tiene sangrado o secrecin inusual de la vagina.  Tiene fiebre o escalofros.  Tiene dificultad para defecar (estreimiento).  Observa sangre en el pis (orina) o en la materia fecal (heces).  El pis tiene mal olor.  Se siente dbil o siente que va a desvanecerse. Solicite ayuda inmediatamente si:  Technical brewer repentino y Wellsite geologist.  El dolor es cada Producer, television/film/video.  Siente un dolor muy intenso y tambin tiene alguno de estos sntomas: ? Cristy Hilts. ? Malestar estomacal (nuseas). ? Vmitos. ? Tiene mucho sudor.  Se desmaya (pierde el conocimiento). Resumen  El dolor plvico se siente en la parte inferior del vientre (abdomen), debajo del ombligo y a nivel de las caderas.  Hay muchas causas posibles del dolor  plvico.  Lleve un registro del dolor plvico. Esta informacin no tiene Marine scientist el consejo del mdico. Asegrese de hacerle al mdico cualquier pregunta que tenga. Document Revised: 11/20/2017 Document Reviewed: 11/20/2017 Elsevier Patient Education  [place].

## 2019-04-28 NOTE — Progress Notes (Signed)
Patient ID: Bonnie Conrad, female   DOB: 27-Jun-1970, 49 y.o.   MRN: [id number]  Chief Complaint  Patient presents with  . Establish Care  pelvic pain  HPI Bonnie Conrad is a 49 y.o. female.  [id number] Patient's last menstrual period was 03/03/2019. For about a month she has had low abdominal pain,some dysuria and dyspareunia. She had negative testing when she was seen in urgent care and ED in December. She notes a history of ovarian cyst in the past. HPI  Past Medical History:  Diagnosis Date  . Abdominal hernia   . Anemia   . Anxiety   . Arthritis   . Blood transfusion without reported diagnosis   . Depression    hosp during pregnancy at [facility] health  . Hernia of abdominal cavity 2009  . Ovarian cyst     Past Surgical History:  Procedure Laterality Date  . COLONOSCOPY WITH ESOPHAGOGASTRODUODENOSCOPY (EGD)    . DILATION AND CURETTAGE OF UTERUS    . EPIGASTRIC HERNIA REPAIR N/A 06/05/2018   Procedure: OPEN REPAIR OF INCARCERATED EPIGASTRIC HERNIA WITH MESH;  Surgeon: Clovis Riley, MD;  Location: [facility];  Service: General;  Laterality: N/A;  . ESOPHAGEAL MANOMETRY N/A 06/29/2018   Procedure: ESOPHAGEAL MANOMETRY (EM);  Surgeon: Mauri Pole, MD;  [facility];  Service: Endoscopy;  Laterality: N/A;  . TUBAL LIGATION  07/25/2011   Procedure: POST PARTUM TUBAL LIGATION;  Surgeon: Mora Bellman, MD;  Location: [facility];  Service: Gynecology;  Laterality: Bilateral;    Family History  Problem Relation Age of Onset  . Hypertension Mother   . Anesthesia problems Neg Hx   . Hypotension Neg Hx   . Malignant hyperthermia Neg Hx   . Pseudochol deficiency Neg Hx   . Colon cancer Neg Hx   . Esophageal cancer Neg Hx   . Inflammatory bowel disease Neg Hx   . Liver disease Neg Hx   . Pancreatic cancer Neg Hx   . Rectal cancer Neg Hx   . Stomach cancer Neg Hx   . Colon polyps Neg Hx     Social History Social History   Tobacco Use  .  Smoking status: Current Some Day Smoker  . Smokeless tobacco: Never Used  . Tobacco comment: a few months ago  Substance Use Topics  . Alcohol use: No  . Drug use: No    Allergies  Allergen Reactions  . Penicillins Swelling    Did it involve swelling of the face/tongue/throat, SOB, or low BP? Yes Did it involve sudden or severe rash/hives, skin peeling, or any reaction on the inside of your mouth or nose? No Did you need to seek medical attention at a hospital or doctor's office? No When did it last happen?1 or 2 years ago If all above answers are "NO", may proceed with cephalosporin use.   Bonnie Conrad Morphine And Related   . Vicodin [Hydrocodone-Acetaminophen] Hives and Itching    Current Outpatient Medications  Medication Sig Dispense Refill  . dicyclomine (BENTYL) 10 MG capsule Take 1 capsule (10 mg total) by mouth 4 (four) times daily -  before meals and at bedtime. To relieve abdominal pain 120 capsule 3  . sucralfate (CARAFATE) 1 g tablet Take 1 tablet (1 g total) by mouth 4 (four) times daily -  with meals and at bedtime for 14 days. 56 tablet 0  . traMADol (ULTRAM) 50 MG tablet Take 1 tablet (50 mg total) by mouth every 6 (six) hours as needed. (  Patient taking differently: Take 50 mg by mouth every 6 (six) hours as needed for moderate pain. ) 20 tablet 0  . famotidine (PEPCID) 40 MG tablet Take 1 tablet (40 mg total) by mouth daily for 14 days. (Patient not taking: Reported on 04/28/2019) 14 tablet 0  . ferrous gluconate (FERGON) 324 MG tablet Take 1 tablet (324 mg total) by mouth daily with breakfast for 30 days. 30 tablet 6  . fluticasone (FLONASE) 50 MCG/ACT nasal spray Place 1 spray into both nostrils daily. (Patient not taking: Reported on 04/28/2019) 16 g 0  . ibuprofen (ADVIL) 600 MG tablet Take 1 tablet (600 mg total) by mouth every 6 (six) hours as needed. (Patient not taking: Reported on 04/28/2019) 30 tablet 0  . omeprazole (PRILOSEC) 40 MG capsule Take 1 capsule (40 mg  total) by mouth 2 (two) times daily. (Patient not taking: Reported on 04/28/2019) 60 capsule 6   No current facility-administered medications for this visit.    Review of Systems Review of Systems  Constitutional: Negative.   Respiratory: Negative.   Genitourinary: Positive for dyspareunia, dysuria, frequency, menstrual problem (pain) and pelvic pain. Negative for vaginal bleeding and vaginal discharge.    Blood pressure 115/67, pulse 76, weight 107 lb (48.5 kg), last menstrual period 03/03/2019.  Physical Exam Physical Exam Constitutional:      Appearance: Normal appearance. She is not ill-appearing.  Cardiovascular:     Rate and Rhythm: Normal rate.  Pulmonary:     Effort: Pulmonary effort is normal.  Abdominal:     General: There is no distension.     Palpations: There is no mass.     Tenderness: There is abdominal tenderness (s/p bilateral). There is no guarding.  Genitourinary:    General: Normal vulva.     Vagina: No vaginal discharge.     Rectum: Normal.     Comments: Bladder and left adnexa tender, no mass, no CMT, uterus nl Neurological:     Mental Status: She is alert.     Data Reviewed Labs, urgent care and ED notes  Assessment Pelvic pain not chronic, may be urinary. Urine culture sent today Report if her sx worsen. As she has a hx of cyst I ordered pelvic US  Plan F/u on testing as ordered, culture and [place]    Bonnie Conrad 04/28/2019, 12:10 PM

## 2019-04-30 LAB — URINE CULTURE: Organism ID, Bacteria: NO GROWTH

## 2019-05-03 LAB — CYTOLOGY - PAP
Comment: NEGATIVE
Diagnosis: NEGATIVE
High risk HPV: NEGATIVE

## 2019-05-05 ENCOUNTER — Other Ambulatory Visit: Payer: Self-pay | Admitting: Obstetrics & Gynecology

## 2019-05-05 ENCOUNTER — Other Ambulatory Visit: Payer: Self-pay

## 2019-05-05 ENCOUNTER — Ambulatory Visit (HOSPITAL_COMMUNITY)
Admission: RE | Admit: 2019-05-05 | Discharge: 2019-05-05 | Disposition: A | Discharge status: Home / Self Care | Payer: Medicaid Other | Source: Ambulatory Visit | Attending: Obstetrics & Gynecology | Admitting: Obstetrics & Gynecology

## 2019-05-05 DIAGNOSIS — R102 Pelvic and perineal pain: Secondary | ICD-10-CM | POA: Diagnosis not present

## 2019-05-07 NOTE — Progress Notes (Signed)
[place] and tests for infection were negative

## 2019-06-22 DIAGNOSIS — H524 Presbyopia: Secondary | ICD-10-CM | POA: Diagnosis not present

## 2019-06-22 DIAGNOSIS — H52223 Regular astigmatism, bilateral: Secondary | ICD-10-CM | POA: Diagnosis not present

## 2019-10-29 ENCOUNTER — Emergency Department (HOSPITAL_COMMUNITY): Payer: Medicaid Other

## 2019-10-29 ENCOUNTER — Emergency Department (HOSPITAL_COMMUNITY)
Admission: EM | Admit: 2019-10-29 | Discharge: 2019-10-29 | Disposition: A | Discharge status: Home / Self Care | Payer: Medicaid Other | Attending: Emergency Medicine | Admitting: Emergency Medicine

## 2019-10-29 ENCOUNTER — Encounter (HOSPITAL_COMMUNITY): Payer: Self-pay | Admitting: Emergency Medicine

## 2019-10-29 ENCOUNTER — Other Ambulatory Visit: Payer: Self-pay

## 2019-10-29 DIAGNOSIS — R102 Pelvic and perineal pain: Secondary | ICD-10-CM | POA: Diagnosis not present

## 2019-10-29 DIAGNOSIS — Y9389 Activity, other specified: Secondary | ICD-10-CM | POA: Diagnosis not present

## 2019-10-29 DIAGNOSIS — R109 Unspecified abdominal pain: Secondary | ICD-10-CM | POA: Insufficient documentation

## 2019-10-29 DIAGNOSIS — Y999 Unspecified external cause status: Secondary | ICD-10-CM | POA: Diagnosis not present

## 2019-10-29 DIAGNOSIS — R079 Chest pain, unspecified: Secondary | ICD-10-CM | POA: Diagnosis not present

## 2019-10-29 DIAGNOSIS — M545 Low back pain: Secondary | ICD-10-CM | POA: Insufficient documentation

## 2019-10-29 DIAGNOSIS — S3992XA Unspecified injury of lower back, initial encounter: Secondary | ICD-10-CM | POA: Diagnosis not present

## 2019-10-29 DIAGNOSIS — F17200 Nicotine dependence, unspecified, uncomplicated: Secondary | ICD-10-CM | POA: Insufficient documentation

## 2019-10-29 DIAGNOSIS — S3991XA Unspecified injury of abdomen, initial encounter: Secondary | ICD-10-CM | POA: Diagnosis not present

## 2019-10-29 DIAGNOSIS — S299XXA Unspecified injury of thorax, initial encounter: Secondary | ICD-10-CM | POA: Diagnosis not present

## 2019-10-29 DIAGNOSIS — R0789 Other chest pain: Secondary | ICD-10-CM | POA: Diagnosis not present

## 2019-10-29 DIAGNOSIS — Y92410 Unspecified street and highway as the place of occurrence of the external cause: Secondary | ICD-10-CM | POA: Diagnosis not present

## 2019-10-29 LAB — COMPREHENSIVE METABOLIC PANEL
ALT: 13 U/L (ref 0–44)
AST: 16 U/L (ref 15–41)
Albumin: 4 g/dL (ref 3.5–5.0)
Alkaline Phosphatase: 51 U/L (ref 38–126)
Anion gap: 10 (ref 5–15)
BUN: 10 mg/dL (ref 6–20)
CO2: 21 mmol/L — ABNORMAL LOW (ref 22–32)
Calcium: 8.9 mg/dL (ref 8.9–10.3)
Chloride: 108 mmol/L (ref 98–111)
Creatinine, Ser: 0.63 mg/dL (ref 0.44–1.00)
GFR calc Af Amer: >60 mL/min (ref >60)
GFR calc non Af Amer: >60 mL/min (ref >60)
Glucose, Bld: 79 mg/dL (ref 70–99)
Potassium: 3.4 mmol/L — ABNORMAL LOW (ref 3.5–5.1)
Sodium: 139 mmol/L (ref 135–145)
Total Bilirubin: 0.2 mg/dL — ABNORMAL LOW (ref 0.3–1.2)
Total Protein: 6.8 g/dL (ref 6.5–8.1)

## 2019-10-29 LAB — CBC
HCT: 38.1 % (ref 36.0–46.0)
Hemoglobin: 12.1 g/dL (ref 12.0–15.0)
MCH: 29.9 pg (ref 26.0–34.0)
MCHC: 31.8 g/dL (ref 30.0–36.0)
MCV: 94.1 fL (ref 80.0–100.0)
Platelets: 240 10*3/uL (ref 150–400)
RBC: 4.05 MIL/uL (ref 3.87–5.11)
RDW: 14.6 % (ref 11.5–15.5)
WBC: 8.7 10*3/uL (ref 4.0–10.5)
nRBC: 0 % (ref 0.0–0.2)

## 2019-10-29 MED ORDER — METHOCARBAMOL 500 MG PO TABS
500.0000 mg | ORAL_TABLET | Freq: Two times a day (BID) | ORAL | 0 refills | Status: DC
Start: 2019-10-29 — End: 2019-11-23

## 2019-10-29 MED ORDER — FENTANYL CITRATE (PF) 100 MCG/2ML IJ SOLN
25.0000 ug | Freq: Once | INTRAMUSCULAR | Status: AC
  Administered 2019-10-29: 25 ug via INTRAVENOUS
  Filled 2019-10-29: qty 2

## 2019-10-29 MED ORDER — IOHEXOL 300 MG/ML  SOLN
100.0000 mL | Freq: Once | INTRAMUSCULAR | Status: AC | PRN
  Administered 2019-10-29: 100 mL via INTRAVENOUS

## 2019-10-29 NOTE — ED Notes (Signed)
Pt c.o a headache 
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
## 2019-10-29 NOTE — ED Notes (Signed)
Pain med given 

## 2019-11-01 ENCOUNTER — Telehealth: Payer: Self-pay | Admitting: *Deleted

## 2019-11-01 NOTE — Telephone Encounter (Addendum)
Assisted by Donnajean Lopes [phone]; pt called back  Transition Care Management Follow-up Telephone Call  . Medicaid Managed Care Transition Call Status:MM [facility] Call Made  . Date of discharge and from where: [facility] 10/29/19  . How have you been since you were released from the hospital? Still not feeling good; still having pain in her back  . Any questions or concerns? Ongoing back pain; considering going to ED/UC; needs refill on ibuprofen  Call dropped; attempted to contact pt; assisted by Justice Rocher, Interpreter # [phone]  Items Reviewed: Marland Kitchen Did the pt receive and understand the discharge instructions provided? Yes  . Medications obtained and verified? Yes picked up medication 11/01/19; she has not taken ibuprofen or methocabamol . Any new allergies since your discharge? No  . Dietary orders reviewed? No not given recommendation . Do you have support at home? No daughters live far away Functional Questionnaire: (I = Independent and D = Dependent)  ADLs: Independent Bathing/Dressing:Independent Meal Prep: Independent Eating: Independent Maintaining continence: Independent Transferring/Ambulation: Independent Managing Meds: Independent Follow up appointments reviewed:  PCP Hospital f/u appt confirmed? No  pt to call PCP on 11/01/19 for follow up appt .  Are transportation arrangements needed? no  If their condition worsens, is the pt aware to call PCP or go to the EmergencyDept.? Yes  Was the patient provided with contact information for the PCP's office or ED?   Was to pt encouraged to call back with questions or concerns? Yes  Order placed for community care coordination for prescription needs.  Lenor Coffin, RN, BSN, [place] Patient [place] [phone]

## 2019-11-01 NOTE — Addendum Note (Signed)
Addended by: Addison Naegeli on: 11/01/2019 12:55 PM   Modules accepted: Orders

## 2019-11-01 NOTE — Telephone Encounter (Signed)
Assisted by Elita Quick, Interpreter # [phone]; attempted to contact pt to complete transition of care assessment; Elita Quick states the number listed for the pt is not is not a working number; attempted to contact pt's sister, message states voice mail has not been set up; attempted to contact emergency contact Nida Boatman; left message on voicemail.  Lenor Coffin, RN, BSN, [place] Patient [place] [phone]

## 2019-11-10 ENCOUNTER — Other Ambulatory Visit: Payer: Self-pay | Admitting: *Deleted

## 2019-11-10 ENCOUNTER — Telehealth: Payer: Self-pay | Admitting: Family Medicine

## 2019-11-10 DIAGNOSIS — R1084 Generalized abdominal pain: Secondary | ICD-10-CM

## 2019-11-10 MED ORDER — DICYCLOMINE HCL 10 MG PO CAPS: 10.0000 mg | ORAL_CAPSULE | Freq: Three times a day (TID) | ORAL | 0 refills | Status: DC

## 2019-11-10 NOTE — Telephone Encounter (Signed)
Medication Refill - Medication: dicyclomine (BENTYL) 10 MG capsule sucralfate (CARAFATE) 1 g tablet    Preferred Pharmacy (with phone number or street name):  [facility], [address] Phone:  [phone]  Fax:  [phone]       Agent: Please be advised that RX refills may take up to 3 business days. We ask that you follow-up with your pharmacy.
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
## 2019-11-15 ENCOUNTER — Ambulatory Visit: Payer: Self-pay | Admitting: *Deleted

## 2019-11-17 ENCOUNTER — Other Ambulatory Visit: Payer: Self-pay

## 2019-11-17 ENCOUNTER — Other Ambulatory Visit: Payer: Self-pay | Admitting: *Deleted

## 2019-11-17 NOTE — Patient Outreach (Signed)
Care Coordination - Case Manager  11/19/2019  Mariyana Fulop [facility] 09/01/1970 [id number]  Subjective:  Bonnie Conrad is an 49 y.o. year old female who is a primary patient of Fulp, Ander Gaster, MD.  Ms. Ngu was given information about Medicaid Managed Care team care coordination services today. Ezekiel Slocumb Valadez-Pacheco agreed to services and verbal consent obtained  Review of patient status, laboratory and other test data was performed as part of evaluation for provision of services.  SDOH: SDOH Screenings   Alcohol Screen:   . Last Alcohol Screening Score (AUDIT):   Depression (PHQ2-9): Low Risk   . PHQ-2 Score: 0  Financial Resource Strain:   . Difficulty of Paying Living Expenses:   Food Insecurity: No Food Insecurity  . Worried About Charity fundraiser in the Last Year: Never true  . Ran Out of Food in the Last Year: Never true  Housing:   . Last Housing Risk Score:   Physical Activity:   . Days of Exercise per Week:   . Minutes of Exercise per Session:   Social Connections:   . Frequency of Communication with Friends and Family:   . Frequency of Social Gatherings with Friends and Family:   . Attends Religious Services:   . Active Member of Clubs or Organizations:   . Attends Archivist Meetings:   Marland Kitchen Marital Status:   Stress:   . Feeling of Stress :   Tobacco Use: High Risk  . Smoking Tobacco Use: Current Some Day Smoker  . Smokeless Tobacco Use: Never Used  Transportation Needs: No Transportation Needs  . Lack of Transportation (Medical): No  . Lack of Transportation (Non-Medical): No     Objective:    Allergies  Allergen Reactions  . Penicillins Swelling    Did it involve swelling of the face/tongue/throat, SOB, or low BP? Yes Did it involve sudden or severe rash/hives, skin peeling, or any reaction on the inside of your mouth or nose? No Did you need to seek medical attention at a hospital or doctor's office?  No When did it last happen?1 or 2 years ago If all above answers are "NO", may proceed with cephalosporin use.   . Vicodin [Hydrocodone-Acetaminophen] Hives and Itching  . Morphine And Related Rash    Nervous,itching    Medications:    Medications Reviewed Today    Reviewed by Barbaraann Faster, RN (Registered Nurse) on 11/10/19 at 40  Med List Status: <None>  Medication Order Taking? Sig Documenting Provider Last Dose Status Informant  dicyclomine (BENTYL) 10 MG capsule [id number]  Take 1 capsule (10 mg total) by mouth 4 (four) times daily -  before meals and at bedtime. To relieve abdominal pain  Patient not taking: Reported on 10/29/2019   Mansouraty, Telford Nab., MD  Active Self  famotidine (PEPCID) 40 MG tablet [id number]  Take 1 tablet (40 mg total) by mouth daily for 14 days.  Patient not taking: Reported on 04/28/2019   Tedd Sias, [place]  Expired 04/28/19 2359   ferrous gluconate (FERGON) 324 MG tablet [id number]  Take 1 tablet (324 mg total) by mouth daily with breakfast for 30 days.  Patient not taking: Reported on 10/29/2019   Mansouraty, Telford Nab., MD  Expired 09/10/18 2359   fluticasone (FLONASE) 50 MCG/ACT nasal spray [id number]  Place 1 spray into both nostrils daily.  Patient not taking: Reported on 04/28/2019   Tamala Julian  Active Self  ibuprofen (ADVIL) 600 MG tablet [id number]  Take 1 tablet (600 mg total) by mouth every 6 (six) hours as needed.  Patient not taking: Reported on 04/28/2019   Tedd Sias, PA  Active Self  methocarbamol (ROBAXIN) 500 MG tablet [id number]  Take 1 tablet (500 mg total) by mouth 2 (two) times daily. Garald Balding, PA-C  Active   omeprazole (PRILOSEC) 40 MG capsule [id number]  Take 1 capsule (40 mg total) by mouth 2 (two) times daily.  Patient not taking: Reported on 04/28/2019   Mansouraty, Telford Nab., MD  Active Self  sucralfate (CARAFATE) 1 g tablet [id number]  Take 1 tablet (1 g total) by mouth 4 (four) times daily -   with meals and at bedtime for 14 days.  Patient not taking: Reported on 10/29/2019   Tedd Sias, [place]  Expired 04/28/19 2359           Assessment:  Outreach to Bonnie Conrad using [place] language interpreter # [phone] Dianetta at [phone] 11/17/19  [facility] staff have not called her to offer assistance with prescribed medications to assist with her GI symptoms. She has not purchased any of the listed and discussed over the counter medications She is still feeling and reports her bad stomach still hurts not taking   Prilosec and Pepcid She reports she had this the pain every day  Discussed chronic pain and encouraged her to get Pepcid or Prilosec from the pharmacy [facility] RN CM discussed the over the counter costs of Pepcid and Prilosec per goodrx as generally 5-10 dollars  She reports she is able to afford this cost The call was disconnected at [phone] by either the patient or interpreter   Goals Addressed              This Visit's Progress     Patient Stated   .  Patient will be able to manage chronic abdominal pain at home with assistance of prescribed medications and scheduled follow up with medical provider (pt-stated)   Not on track     [place] (see longitudinal plan of care for additional care plan information)  Current Barriers:  . Patient with  chronic abdominal pain  in need of assistance with connection to community resources  . Knowledge deficits and need for support, education and care coordination related to community resources support  . Medication assistance referral to [facility]   Clinical Goal(s)  . Over the next 31 days patient will be able to remain without admission as demonstrated by no documented admissions via EPIC . Over the next 14 days, patient will work with care management team member to address concerns related to medications concerns, medication assistance and connection with [place] and wellness medical and pharmacy  providers   Interventions provided by RN CM :  . Assessed patient's care coordination needs related to chronic pain  and medication assistance and discussed ongoing care management follow up  . Provided patient with information about over the counter medication not covered by medicaid  . Advised patient to attend scheduled pcp appointment on 11/15/19  . Collaborated with appropriate clinical care team members regarding patient needs . Patient interviewed and appropriate assessments performed . Discussed plans with patient for ongoing care management follow up and provided patient with direct contact information for care management team . Evaluation of current treatment plan related to abdominal pain, listed medications (prescribed, paid for by medicaid and over the counter ones not paid for by medicaid and patient's adherence to plan as  established by provider. . Reviewed medications with patient and discussed which medications are over the counter and are not paid for by medicaid coverage . Collaborated with Rossie Muskrat interpreter, [place] regarding Spanish interpreting to assess for patient care coordination/disease management needs  . Discussed plans with patient for ongoing care management follow up and provided patient with direct contact information for care management team . Interpreter assist from [facility] made [phone]) for assistance with speaking and assessing patient  . Sent message to pcp inquiring about assist with medication call in to pharmacy until scheduled appointment . Discussed the goodrx cost of pepcid and prilosec over the counter   Patient Self Care Activities & Deficits:  . Patient is unable to independently navigate community resource options without care coordination support  . Acknowledges deficits and is motivated to resolve concern  . Patient is able to contact pcp as discussed today . {[facility] SELF CARE DEFICITS:23936 . Attends all scheduled provider appointments . Calls  provider office for new concerns or questions  Initial goal documentation         Plan:  [facility] RN CM will follow up with Bonnie Conrad after her pcp visit to assess if the medication assistance concern is resolved and to offer disease management Routed note to MD

## 2019-11-23 ENCOUNTER — Other Ambulatory Visit: Payer: Self-pay

## 2019-11-23 ENCOUNTER — Ambulatory Visit: Payer: Medicaid Other | Attending: Family | Admitting: Family

## 2019-11-23 DIAGNOSIS — Z789 Other specified health status: Secondary | ICD-10-CM | POA: Diagnosis not present

## 2019-11-23 DIAGNOSIS — M545 Low back pain, unspecified: Secondary | ICD-10-CM

## 2019-11-23 DIAGNOSIS — Z09 Encounter for follow-up examination after completed treatment for conditions other than malignant neoplasm: Secondary | ICD-10-CM

## 2019-11-23 MED ORDER — METHOCARBAMOL 500 MG PO TABS: 500.0000 mg | ORAL_TABLET | Freq: Two times a day (BID) | ORAL | 0 refills | Status: DC
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
## 2019-11-24 NOTE — Patient Instructions (Addendum)
Metocarbamol para espasmos musculares. Realice un seguimiento con el mdico de cabecera segn sea necesario. Informe al departamento de emergencias si los sntomas empeoran o se agravan.  Methocarbamol for muscle spasms. Follow-up with primary physician as needed. Report to emergency department if symptoms worsen and/or become severe.  Dolor de espalda agudo en los adultos Acute Back Pain, Adult El dolor de espalda agudo es repentino y por lo general no dura mucho tiempo. Se debe generalmente a una lesin de los msculos y tejidos de la espalda. La lesin puede ser el resultado de:  Estiramiento en exceso o desgarro (esguince) de un msculo o ligamento. Los ligamentos son tejidos que [employer]. Levantar algo de forma incorrecta puede producir un esguince de espalda.  Desgaste (degeneracin) de los discos vertebrales. Los discos vertebrales son tejido Advertising account planner que proporciona acolchonamiento a los huesos de la columna vertebral (vrtebras).  Movimientos de giro, como al practicar deportes o realizar trabajos de [place].  Un golpe en la espalda.  Artritis. Es posible Hydrologist un examen fsico, anlisis de laboratorio u otros estudios de diagnstico por imgenes para Animator causa del Social research officer, government. El dolor de espalda agudo generalmente desaparece con reposo y cuidados en la casa. Sigue estas indicaciones en tu casa: Control del dolor, el entumecimiento y la hinchazn  Toma los medicamentos de venta libre y los recetados solamente como se lo haya indicado el mdico.  El mdico puede recomendarle que se aplique hielo durante las primeras 24a 48horas despus del comienzo del Social research officer, government. Para hacer esto: ? Ponga el hielo en una bolsa plstica. ? Coloque una [employer] piel y Therapist, nutritional. ? Coloque el hielo durante 46minutos, 2 a 3veces por da.  Si se lo indican, aplique calor en la zona afectada con la frecuencia que le haya indicado el mdico. Use la fuente de calor que el  mdico le recomiende, como una compresa de calor hmedo o una almohadilla trmica. ? Colquese una [employer] piel y la fuente de Freight forwarder. ? Aplique calor durante 20 a 21minutos. ? Retire la fuente de calor si la piel se pone de color rojo brillante. Esto es especialmente importante si no puede sentir dolor, calor o fro. Corre un mayor riesgo de sufrir quemaduras. Actividad   No permanezca en la cama. Hacer reposo en la cama por ms de 1 a 2 das puede demorar su recuperacin.  Mantenga una buena postura al sentarse y pararse. No se incline hacia adelante al sentarse ni se encorve al pararse. ? Si trabaja en un escritorio, sintese cerca de este para no tener que inclinarse. Mantenga el mentn hacia abajo. Mantenga el cuello hacia atrs y los codos flexionados en ngulo recto. La posicin de los brazos debe verse como la letra "L". ? Cuando conduzca, sintese elevado y cerca del volante. Agregue un apoyo para la espalda (lumbar) al asiento del automvil, si es necesario.  Realice caminatas cortas en superficies planas tan pronto como le sea posible. Trate de caminar un poco ms de Publishing copy.  No se siente, conduzca o permanezca de pie en un mismo lugar durante ms de 30 minutos seguidos. Pararse o sentarse durante largos perodos de Radiographer, therapeutic la espalda.  No conduzca ni use maquinaria pesada mientras toma analgsicos recetados.  Use tcnicas apropiadas para levantar objetos. Cuando se inclina y Chief Executive Officer un [place], utilice posiciones que no sobrecarguen tanto la espalda: ? [place]. ? Mantenga la carga cerca del cuerpo. ? No gire el  cuerpo.  Haga actividad fsica habitualmente como se lo haya indicado el mdico. Hacer ejercicios ayuda a que la espalda sane ms rpido y [place] a Product/process development scientist las lesiones de la espalda al [employer] msculos fuertes y flexibles.  Trabaje con un fisioterapeuta para crear un programa de ejercicios seguros, segn lo recomiende el  mdico. Haga ejercicios como se lo haya indicado el fisioterapeuta. Estilo de vida  Mantenga un peso saludable. El sobrepeso sobrecarga la espalda y hace que resulte difcil tener una buena [place].  Evite actividades o situaciones que lo hagan sentirse ansioso o estresado. El estrs y la ansiedad aumentan la tensin muscular y pueden empeorar el dolor de espalda. Aprenda formas de [employer] ansiedad y [place], como a travs del ejercicio. Indicaciones generales  Duerma sobre un colchn firme en una posicin cmoda. Intente acostarse de costado, con las rodillas ligeramente flexionadas. Si se recuesta [employer], coloque una almohada debajo de las rodillas.  Siga el plan de tratamiento como se lo haya indicado el mdico. Puede incluir: ? Terapia cognitiva o conductual. ? Acupuntura o terapia de masajes. ? Yoga o meditacin. Comuncate con un mdico si:  Siente un dolor que no se alivia con reposo o medicamentos.  Siente mucho dolor que se extiende a las piernas o las nalgas.  El dolor no mejora luego de 2 semanas.  Siente dolor por la noche.  Pierde peso sin proponrselo.  Tiene fiebre o escalofros. Solicite ayuda inmediatamente si:  Tiene nuevos problemas para controlar la vejiga o los intestinos.  Siente debilidad o adormecimiento inusuales en los brazos o en las piernas.  Siente nuseas o vmitos.  Siente dolor abdominal.  Siente que va a desmayarse. Resumen  El dolor de espalda agudo es repentino y por lo general no dura mucho tiempo.  Use tcnicas apropiadas para levantar objetos. Cuando se inclina y levanta un [place], utilice posiciones que no sobrecarguen tanto la espalda.  Tome los medicamentos de venta libre o recetados y aplique calor o hielo solamente como se lo haya indicado el mdico. Esta informacin no tiene Marine scientist el consejo del mdico. Asegrese de hacerle al mdico cualquier pregunta que tenga. Document Revised: 11/07/2017  Document Reviewed: 01/16/2017 Elsevier Patient Education  [place].

## 2019-11-26 ENCOUNTER — Ambulatory Visit: Payer: Self-pay | Admitting: *Deleted

## 2019-11-29 ENCOUNTER — Other Ambulatory Visit: Payer: Self-pay | Admitting: *Deleted

## 2019-11-29 ENCOUNTER — Other Ambulatory Visit: Payer: Self-pay

## 2019-11-29 NOTE — Telephone Encounter (Signed)
Call to pharmacy: They do see that Rx and can fill it at the location requested.

## 2019-11-29 NOTE — Telephone Encounter (Signed)
Pt never picked up the Rx  dicyclomine (BENTYL) 10 MG capsule Because she did not know it was at the other [place]. Can you resend it to  [facility], [address] AT [facility] [address] Phone:  [phone]  Fax:  [phone]      Pt states this works better than the Carafate, so she will try this for now.

## 2019-11-29 NOTE — Patient Outreach (Signed)
Care Coordination - Case Manager  11/29/2019  Yasaman Kolek [facility] 1971/03/25 [id number]  Subjective:  Bonnie Conrad is an 49 y.o. year old female who is a primary patient of Fulp, Ander Gaster, MD.  Ms. Lanni was given information about Medicaid Managed Care team care coordination services today. Ezekiel Slocumb Valadez-Pacheco agreed to services and verbal consent obtained  Review of patient status, laboratory and other test data was performed as part of evaluation for provision of services.  11/29/19 reports she feels better today related to her back and abdominal pain Confirms recent telemedicine outreach from [facility] health and wellness center) staff, Durene Fruits on 11/23/19 about her hospital follow up and discussion of her back pain and not abdominal pain  She confirms a preference to have pcp office call to her pharmacy Bentyl vs Carafate for abdominal pain vs over the counter (OTC) medicines Agreed to have [place] complete a conference call to [facility] to assist with this Pt clarified her preferred outreach number and [facility] pharmacy on [phone] [place] [facility] corrected this in [place] and to have [facility] RN call in Bentyl to the correct [facility]- it was [facility] the [facility] on 11/10/19)  Pt disconnected the call prior to the conclusion of the outreach  SDOH: SDOH Screenings   Alcohol Screen:   . Last Alcohol Screening Score (AUDIT):   Depression (PHQ2-9): Low Risk   . PHQ-2 Score: 0  Financial Resource Strain:   . Difficulty of Paying Living Expenses:   Food Insecurity: No Food Insecurity  . Worried About Charity fundraiser in the Last Year: Never true  . Ran Out of Food in the Last Year: Never true  Housing:   . Last Housing Risk Score:   Physical Activity:   . Days of Exercise per Week:   . Minutes of Exercise per Session:   Social Connections:   . Frequency of Communication with Friends and Family:   .  Frequency of Social Gatherings with Friends and Family:   . Attends Religious Services:   . Active Member of Clubs or Organizations:   . Attends Archivist Meetings:   Marland Kitchen Marital Status:   Stress:   . Feeling of Stress :   Tobacco Use: High Risk  . Smoking Tobacco Use: Current Some Day Smoker  . Smokeless Tobacco Use: Never Used  Transportation Needs: No Transportation Needs  . Lack of Transportation (Medical): No  . Lack of Transportation (Non-Medical): No     Objective:    Allergies  Allergen Reactions  . Penicillins Swelling    Did it involve swelling of the face/tongue/throat, SOB, or low BP? Yes Did it involve sudden or severe rash/hives, skin peeling, or any reaction on the inside of your mouth or nose? No Did you need to seek medical attention at a hospital or doctor's office? No When did it last happen?1 or 2 years ago If all above answers are "NO", may proceed with cephalosporin use.   . Vicodin [Hydrocodone-Acetaminophen] Hives and Itching  . Morphine And Related Rash    Nervous,itching    Medications:    Medications Reviewed Today    Reviewed by Barbaraann Faster, RN (Registered Nurse) on 11/29/19 at 1249  Med List Status: <None>  Medication Order Taking? Sig Documenting Provider Last Dose Status Informant  dicyclomine (BENTYL) 10 MG capsule [id number]  Take 1 capsule (10 mg total) by mouth 4 (four) times daily -  before  meals and at bedtime. To relieve abdominal pain Camillia Herter, NP  Active   famotidine (PEPCID) 40 MG tablet [id number]  Take 1 tablet (40 mg total) by mouth daily for 14 days.  Patient not taking: Reported on 04/28/2019   Tedd Sias, [place]  Expired 04/28/19 2359   ferrous gluconate (FERGON) 324 MG tablet [id number]  Take 1 tablet (324 mg total) by mouth daily with breakfast for 30 days.  Patient not taking: Reported on 10/29/2019   Mansouraty, Telford Nab., MD  Expired 09/10/18 2359   fluticasone (FLONASE) 50 MCG/ACT nasal spray  [id number]  Place 1 spray into both nostrils daily.  Patient not taking: Reported on 04/28/2019   Albesa Seen, PA-C  Active Self  ibuprofen (ADVIL) 600 MG tablet [id number]  Take 1 tablet (600 mg total) by mouth every 6 (six) hours as needed.  Patient not taking: Reported on 04/28/2019   Tedd Sias, PA  Active Self  methocarbamol (ROBAXIN) 500 MG tablet [id number]  Take 1 tablet (500 mg total) by mouth 2 (two) times daily. Camillia Herter, NP  Active   omeprazole (PRILOSEC) 40 MG capsule [id number]  Take 1 capsule (40 mg total) by mouth 2 (two) times daily.  Patient not taking: Reported on 04/28/2019   Mansouraty, Telford Nab., MD  Active Self  sucralfate (CARAFATE) 1 g tablet [id number]  Take 1 tablet (1 g total) by mouth 4 (four) times daily -  with meals and at bedtime for 14 days.  Patient not taking: Reported on 10/29/2019   Tedd Sias, [place]  Expired 04/28/19 2359           Assessment:  [facility] RN CM with the assistance of [place] of [place] [phone]  Patient is able to verify HIPAA ([place] and Accountability Act) identifiers, date of birth (DOB) and address Reviewed and addressed the purpose of the follow up call with the patient-follow up post ED hospital pcp visit- abdominal pain  Consent: [facility] RN CM reviewed [facility] services with patient. Patient gave verbal consent for services.  Continues with some abdominal pain and has not obtained ordered or prescribed medications at this time [facility] RN CM previously called for assistance on 11/10/19  Pt with telemedicine visit with A Minette Brine [facility] NP on 11/23/19    Goals Addressed              This Visit's Progress     Patient Stated   .  Patient will be able to manage chronic abdominal & back pain at home with assistance of prescribed medications and scheduled follow up with medical provider (pt-stated)   On track     [place] (see longitudinal plan of care for  additional care plan information)  Current Barriers:  . Patient with  chronic abdominal pain  in need of assistance with connection to community resources  . Knowledge deficits and need for support, education and care coordination related to community resources support  . Medication assistance referral to [facility]   Clinical Goal(s)  . Over the next 31 days patient will be able to remain without admission as demonstrated by no documented admissions via EPIC . Over the next 14 days, patient will work with care management team member to address concerns related to medications concerns, medication assistance and connection with [place] and wellness medical and pharmacy providers- Met 8/16'21   Interventions provided by RN CM :  . Assessed patient's care coordination needs related  to chronic pain  and medication assistance and discussed ongoing care management follow up  . Provided patient with information about over the counter medication not covered by medicaid  . Advised patient to attend scheduled pcp appointment on 11/15/19  . Collaborated with appropriate clinical care team members regarding patient needs . Patient interviewed and appropriate assessments performed . Discussed plans with patient for ongoing care management follow up and provided patient with direct contact information for care management team . Evaluation of current treatment plan related to abdominal pain, listed medications (prescribed, paid for by medicaid and over the counter ones not paid for by medicaid and patient's adherence to plan as established by provider. . Reviewed medications with patient and discussed which medications are over the counter and are not paid for by medicaid coverage . Collaborated with Rossie Muskrat interpreter, [place] regarding Spanish interpreting to assess for patient care coordination/disease management needs  . Discussed plans with patient for ongoing care management follow up and provided patient  with direct contact information for care management team . Interpreter assist from [facility] made [phone]) for assistance with speaking and assessing patient  . Sent message to pcp inquiring about assist with medication call in to pharmacy until scheduled appointment . Discussed the goodrx cost of pepcid and prilosec over the counter  . Complete conference call with patient, interpreter # [phone] & [facility] staff [place] to get assist with Bentyl called in to [facility]  Patient Self Care Activities & Deficits:  . Patient is unable to independently navigate community resource options without care coordination support  . Acknowledges deficits and is motivated to resolve concern  . Patient is able to contact pcp as discussed today . {THN SELF CARE DEFICITS:23936 . Self administers medications as prescribed . Attends all scheduled provider appointments . Calls provider office for new concerns or questions  Initial goal documentation         Plan:  [facility] RN CM will follow up with Mrs Zebedee Iba within the next 14-21 business days Routed note to MD  [place]. Lavina Hamman, RN, BSN, [place] Coordinator Office number [phone] Mobile number [phone]  Main [facility] number [phone] Fax number [phone]

## 2019-12-13 ENCOUNTER — Ambulatory Visit: Payer: Self-pay | Admitting: *Deleted

## 2019-12-16 ENCOUNTER — Ambulatory Visit: Payer: Self-pay | Admitting: *Deleted

## 2019-12-17 ENCOUNTER — Other Ambulatory Visit: Payer: Self-pay | Admitting: *Deleted

## 2019-12-17 ENCOUNTER — Other Ambulatory Visit: Payer: Self-pay

## 2019-12-17 ENCOUNTER — Encounter: Payer: Self-pay | Admitting: *Deleted

## 2019-12-17 NOTE — Patient Outreach (Addendum)
Care Coordination - Case Manager  12/17/2019  Bonnie Conrad [facility] 1970-07-06 [id number]  Subjective:  Bonnie Conrad is an 49 y.o. year old female who is a primary patient of Fulp, Ander Gaster, MD.  Bonnie Conrad was given information about Medicaid Managed Care team care coordination services today. Bonnie Conrad agreed to services and verbal consent obtained  Review of patient status, laboratory and other test data was performed as part of evaluation for provision of services.  "Doing a little better with the pills " She denies need of further change in medicine treatment plan at this time  She voices she wants help with the pain  She reports it is gastritis that is worst  after meals  still have pain on one side of her stomach  She wants to get an appointment with a MD who treated her one year ago  She voices appreciation for the outreach assist to get an appointment with Dr Rush Landmark  SDOH: SDOH Screenings   Alcohol Screen:   . Last Alcohol Screening Score (AUDIT): Not on file  Depression (PHQ2-9): Low Risk   . PHQ-2 Score: 0  Financial Resource Strain:   . Difficulty of Paying Living Expenses: Not on file  Food Insecurity: No Food Insecurity  . Worried About Charity fundraiser in the Last Year: Never true  . Ran Out of Food in the Last Year: Never true  Housing:   . Last Housing Risk Score: Not on file  Physical Activity:   . Days of Exercise per Week: Not on file  . Minutes of Exercise per Session: Not on file  Social Connections:   . Frequency of Communication with Friends and Family: Not on file  . Frequency of Social Gatherings with Friends and Family: Not on file  . Attends Religious Services: Not on file  . Active Member of Clubs or Organizations: Not on file  . Attends Archivist Meetings: Not on file  . Marital Status: Not on file  Stress:   . Feeling of Stress : Not on file  Tobacco Use: High Risk  . Smoking  Tobacco Use: Current Some Day Smoker  . Smokeless Tobacco Use: Never Used  Transportation Needs: No Transportation Needs  . Lack of Transportation (Medical): No  . Lack of Transportation (Non-Medical): No     Objective:    Allergies  Allergen Reactions  . Penicillins Swelling    Did it involve swelling of the face/tongue/throat, SOB, or low BP? Yes Did it involve sudden or severe rash/hives, skin peeling, or any reaction on the inside of your mouth or nose? No Did you need to seek medical attention at a hospital or doctor's office? No When did it last happen?1 or 2 years ago If all above answers are "NO", may proceed with cephalosporin use.   . Vicodin [Hydrocodone-Acetaminophen] Hives and Itching  . Morphine And Related Rash    Nervous,itching    Medications:    Medications Reviewed Today    Reviewed by Barbaraann Faster, RN (Registered Nurse) on 11/29/19 at 1249  Med List Status: <None>  Medication Order Taking? Sig Documenting Provider Last Dose Status Informant  dicyclomine (BENTYL) 10 MG capsule [id number]  Take 1 capsule (10 mg total) by mouth 4 (four) times daily -  before meals and at bedtime. To relieve abdominal pain Camillia Herter, NP  Active   famotidine (PEPCID) 40 MG tablet [id number]  Take 1 tablet (40 mg total) by mouth daily for 14 days.  Patient not taking: Reported on 04/28/2019   Tedd Sias, [place]  Expired 04/28/19 2359   ferrous gluconate (FERGON) 324 MG tablet [id number]  Take 1 tablet (324 mg total) by mouth daily with breakfast for 30 days.  Patient not taking: Reported on 10/29/2019   Mansouraty, Telford Nab., MD  Expired 09/10/18 2359   fluticasone (FLONASE) 50 MCG/ACT nasal spray [id number]  Place 1 spray into both nostrils daily.  Patient not taking: Reported on 04/28/2019   Albesa Seen, PA-C  Active Self  ibuprofen (ADVIL) 600 MG tablet [id number]  Take 1 tablet (600 mg total) by mouth every 6 (six) hours as needed.  Patient not taking:  Reported on 04/28/2019   Tedd Sias, PA  Active Self  methocarbamol (ROBAXIN) 500 MG tablet [id number]  Take 1 tablet (500 mg total) by mouth 2 (two) times daily. Camillia Herter, NP  Active   omeprazole (PRILOSEC) 40 MG capsule [id number]  Take 1 capsule (40 mg total) by mouth 2 (two) times daily.  Patient not taking: Reported on 04/28/2019   Mansouraty, Telford Nab., MD  Active Self  sucralfate (CARAFATE) 1 g tablet [id number]  Take 1 tablet (1 g total) by mouth 4 (four) times daily -  with meals and at bedtime for 14 days.  Patient not taking: Reported on 10/29/2019   Tedd Sias, [place]  Expired 04/28/19 2359           Assessment:   Outreach to patient using pacific language line interpreter [name] # [phone]  Conference with patient to D Mansouraty, Gastroenterology office  to make an appointment to be seen She had missed a previously appointment She also updated her number for future outreach  She was provided with an appointment for Wednesday 01/19/20 at 10 am as she reports worsening symptoms.   Plan:  [facility] RN CM will outreach to Bonnie Conrad within the next 21-28 business days Pt encouraged to return a call to [facility] RN CM prn Routed note to MD  Goals Addressed              This Visit's Progress     Patient Stated   .  [facility]) Patient will be able to manage chronic abdominal & back pain at home with assistance of prescribed medications and scheduled follow up with medical provider (pt-stated)   On track     [place] (see longitudinal plan of care for additional care plan information)  Current Barriers:  . Patient with  chronic abdominal pain  in need of assistance with connection to community resources  . Knowledge deficits and need for support, education and care coordination related to community resources support  . Medication assistance referral to [facility]  - Resolved 12/17/19   Clinical Goal(s)  . Over the next 31 days patient will be able to remain without  admission as demonstrated by no documented admissions via EPIC . Over the next 14 days, patient will work with care management team member to address concerns related to medications concerns, medication assistance and connection with [place] and wellness medical and pharmacy providers- Met 11/29/19   Interventions provided by RN CM :  . Assessed patient's care coordination needs related to chronic pain  and medication assistance and discussed ongoing care management follow up  . Provided patient with information about over the counter medication not covered by medicaid  . Advised patient to attend scheduled pcp appointment on 11/15/19  . Collaborated with appropriate clinical care team  members regarding patient needs . Patient interviewed and appropriate assessments performed . Discussed plans with patient for ongoing care management follow up and provided patient with direct contact information for care management team . Evaluation of current treatment plan related to abdominal pain, listed medications (prescribed, paid for by medicaid and over the counter ones not paid for by medicaid and patient's adherence to plan as established by provider. . Reviewed medications with patient and discussed which medications are over the counter and are not paid for by medicaid coverage . Collaborated with Rossie Muskrat interpreter, [place] regarding Spanish interpreting to assess for patient care coordination/disease management needs  . Discussed plans with patient for ongoing care management follow up and provided patient with direct contact information for care management team . Interpreter assist from [facility] made [phone]) for assistance with speaking and assessing patient  . Sent message to pcp inquiring about assist with medication call in to pharmacy until scheduled appointment . Discussed the goodrx cost of Pepcid and Prilosec over the counter  . Complete conference call with patient, interpreter # [phone] &  [facility] staff Tye Maryland to get assist with Bentyl called in to [facility] pharmacy . Complete conference call with patient, interpreter # [phone] & Dr Rush Landmark staff Janett Billow to get assist with follow up appointment to GI on Wednesday 12/22/19 10 am and plus updated her number in the office data base  Patient Self Care Activities & Deficits:  . Patient is unable to independently navigate community resource options without care coordination support  . Acknowledges deficits and is motivated to resolve concern  . Patient is able to contact pcp as discussed today . {THN SELF CARE DEFICITS:23936 . Self administers medications as prescribed . Attends all scheduled provider appointments . Calls provider office for new concerns or questions  Please see past updates related to this goal by clicking on the "Past Updates" button in the selected goal            L. [place] Management Coordinator Office  [place]  Fax: [phone]  [address] [place], [place] [address] Website:  [email or web address]

## 2019-12-21 ENCOUNTER — Telehealth: Payer: Self-pay

## 2019-12-21 NOTE — Telephone Encounter (Signed)
The pt has an appt on 10/6 at 10 am with Tye Savoy.  She has been advised.

## 2019-12-21 NOTE — Telephone Encounter (Signed)
-----   Message from Irving Copas., MD sent at 12/21/2019  6:37 AM EDT ----- Thank you for sending this to me.Bonnie Conrad, can you please work on scheduling this patient a clinic visit with myself or one of our APP's.Follow-up of abdominal pain and abdominal spasms.If she is not taking the dicyclomine at this time, we certainly can get that to her as she had found that helpful previously.She could have refills of this if necessary until her follow-up set up.Thanks.GM ----- Message ----- From: Barbaraann Faster, RN Sent: 12/17/2019   5:21 PM EDT To: Camillia Herter, NP, Irving Copas., MD, #

## 2020-01-07 ENCOUNTER — Other Ambulatory Visit: Payer: Self-pay | Admitting: *Deleted

## 2020-01-07 ENCOUNTER — Other Ambulatory Visit: Payer: Self-pay
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
## 2020-01-19 ENCOUNTER — Ambulatory Visit: Payer: Medicaid Other | Admitting: Nurse Practitioner

## 2020-01-19 ENCOUNTER — Encounter: Payer: Self-pay | Admitting: Nurse Practitioner

## 2020-01-19 VITALS — BP 108/62 | HR 62 | Wt 102.0 lb

## 2020-01-19 DIAGNOSIS — R110 Nausea: Secondary | ICD-10-CM

## 2020-01-19 DIAGNOSIS — F32A Depression, unspecified: Secondary | ICD-10-CM | POA: Diagnosis not present

## 2020-01-19 DIAGNOSIS — R634 Abnormal weight loss: Secondary | ICD-10-CM

## 2020-01-19 DIAGNOSIS — R1012 Left upper quadrant pain: Secondary | ICD-10-CM

## 2020-01-19 NOTE — Patient Instructions (Addendum)
If you are age 47 or older, your body mass index should be between 23-30. Your Body mass index is 21.32 kg/m. If this is out of the aforementioned range listed, please consider follow up with your Primary Care Provider.  If you are age 20 or younger, your body mass index should be between 19-25. Your Body mass index is 21.32 kg/m. If this is out of the aformentioned range listed, please consider follow up with your Primary Care Provider.   We have sent the following medications to your pharmacy for you to pick up at your convenience: Omeprazole 20 mg daily 30-60 minutes before breakfast. Increase Bentyl 10 mg to 20-30 minutes before meals.  Salonpas patches as directed.  Follow up with PCP about depression.
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
## 2020-01-20 NOTE — Progress Notes (Signed)
Attending Physician's Attestation   I have reviewed the chart.   I agree with the Advanced Practitioner's note, impression, and recommendations with any updates as below.    Kerrilyn Azbill Mansouraty, MD [facility] Office # [phone]  

## 2020-02-22 ENCOUNTER — Ambulatory Visit (INDEPENDENT_AMBULATORY_CARE_PROVIDER_SITE_OTHER): Payer: Medicaid Other | Admitting: Nurse Practitioner

## 2020-02-22 ENCOUNTER — Encounter: Payer: Self-pay | Admitting: Nurse Practitioner

## 2020-02-22 VITALS — BP 110/66 | HR 86 | Ht <= 58 in | Wt 102.2 lb

## 2020-02-22 DIAGNOSIS — R1012 Left upper quadrant pain: Secondary | ICD-10-CM

## 2020-02-22 DIAGNOSIS — K219 Gastro-esophageal reflux disease without esophagitis: Secondary | ICD-10-CM | POA: Diagnosis not present

## 2020-02-22 DIAGNOSIS — F32A Depression, unspecified: Secondary | ICD-10-CM | POA: Diagnosis not present
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
## 2020-02-22 NOTE — Patient Instructions (Addendum)
If you are age 47 or older, your body mass index should be between 23-30. Your Body mass index is 21.36 kg/m. If this is out of the aforementioned range listed, please consider follow up with your Primary Care Provider.  If you are age 46 or younger, your body mass index should be between 19-25. Your Body mass index is 21.36 kg/m. If this is out of the aformentioned range listed, please consider follow up with your Primary Care Provider.   We will contact PCP for treatment for your depression.  Stop Bentyl.   Continue Omeprazole as needed.  Follow up in 2 months.

## 2020-02-24 NOTE — Progress Notes (Signed)
Attending Physician's Attestation   I have reviewed the chart.   I agree with the Advanced Practitioner's note, impression, and recommendations with any updates as below.    Katja Blue Mansouraty, MD [facility] Office # [phone]  

## 2021-05-17 ENCOUNTER — Other Ambulatory Visit: Payer: Self-pay

## 2021-05-17 ENCOUNTER — Encounter: Payer: Self-pay | Admitting: Physician Assistant

## 2021-05-17 ENCOUNTER — Other Ambulatory Visit (HOSPITAL_COMMUNITY)
Admission: RE | Admit: 2021-05-17 | Discharge: 2021-05-17 | Disposition: A | Discharge status: Home / Self Care | Payer: Medicaid Other | Source: Ambulatory Visit | Attending: Physician Assistant | Admitting: Physician Assistant

## 2021-05-17 ENCOUNTER — Ambulatory Visit: Payer: Medicaid Other | Attending: Physician Assistant | Admitting: Physician Assistant

## 2021-05-17 VITALS — BP 122/80 | HR 87 | Resp 16 | Wt 103.0 lb

## 2021-05-17 DIAGNOSIS — R110 Nausea: Secondary | ICD-10-CM

## 2021-05-17 DIAGNOSIS — N926 Irregular menstruation, unspecified: Secondary | ICD-10-CM | POA: Diagnosis not present

## 2021-05-17 DIAGNOSIS — R1084 Generalized abdominal pain: Secondary | ICD-10-CM | POA: Diagnosis not present

## 2021-05-17 DIAGNOSIS — D509 Iron deficiency anemia, unspecified: Secondary | ICD-10-CM | POA: Diagnosis not present

## 2021-05-17 DIAGNOSIS — N390 Urinary tract infection, site not specified: Secondary | ICD-10-CM | POA: Diagnosis not present

## 2021-05-17 DIAGNOSIS — G8929 Other chronic pain: Secondary | ICD-10-CM

## 2021-05-17 DIAGNOSIS — Z789 Other specified health status: Secondary | ICD-10-CM | POA: Diagnosis not present

## 2021-05-17 LAB — POCT URINALYSIS DIP (CLINITEK)
Bilirubin, UA: NEGATIVE
Glucose, UA: NEGATIVE mg/dL
Ketones, POC UA: NEGATIVE mg/dL
Nitrite, UA: NEGATIVE
POC PROTEIN,UA: NEGATIVE
Spec Grav, UA: 1.02 (ref 1.010–1.025)
Urobilinogen, UA: 0.2 E.U./dL
pH, UA: 7 (ref 5.0–8.0)

## 2021-05-17 LAB — POCT URINE PREGNANCY: Preg Test, Ur: NEGATIVE

## 2021-05-17 MED ORDER — NITROFURANTOIN MONOHYD MACRO 100 MG PO CAPS: 100.0000 mg | ORAL_CAPSULE | Freq: Two times a day (BID) | ORAL | 0 refills | Status: DC

## 2021-05-17 MED ORDER — OMEPRAZOLE 20 MG PO CPDR: 20.0000 mg | DELAYED_RELEASE_CAPSULE | Freq: Every day | ORAL | 3 refills | Status: DC

## 2021-05-17 NOTE — Progress Notes (Signed)
Patient ID: Bonnie Conrad, female   DOB: 02/21/1971, 51 y.o.   MRN: [id number]   Bonnie Conrad, is a 51 y.o. female  SHF:[id number]  FOY:[id number]  DOB - 08/26/1970  Chief Complaint  Patient presents with   Abdominal Pain   Back Pain   Nausea       Subjective:   Bonnie Conrad is a 51 y.o. female here today for  Pain in pelvic area and in LUQ and L side of back.  She has been having this pain for years.  She has seen GI.  Pain with sex at times.  Still having periods.  Sometimes 2 a month.  No vaginal discharge.  No pain with urination.  Pain is all day everyday.  +nausea.  No vomiting.  No diarrhea.  No melena or hematochezia.    CT abd/pelvis 10/2019: IMPRESSION: 1. No evidence of acute traumatic injury to the chest, abdomen, or pelvis. 2. A 3.7 cm left ovarian cyst is likely incidental and no dedicated imaging follow-up is needed given size. 3. Mild hepatic steatosis.  No problems updated.  ALLERGIES: Allergies  Allergen Reactions   Penicillins Swelling    Did it involve swelling of the face/tongue/throat, SOB, or low BP? Yes Did it involve sudden or severe rash/hives, skin peeling, or any reaction on the inside of your mouth or nose? No Did you need to seek medical attention at a hospital or doctor's office? No When did it last happen?  1 or 2 years ago     If all above answers are "NO", may proceed with cephalosporin use.    Vicodin [Hydrocodone-Acetaminophen] Hives and Itching   Morphine And Related Rash    Nervous,itching    PAST MEDICAL HISTORY: Past Medical History:  Diagnosis Date   Abdominal hernia    Anemia    Anxiety    Arthritis    Blood transfusion without reported diagnosis    Depression    [facility]osp [facility] at [facility]   Hernia of abdominal cavity 2009   Ovarian cyst     MEDICATIONS AT HOME: Prior to Admission medications   Medication Sig Start Date End Date Taking? Authorizing Provider  omeprazole  (PRILOSEC) 20 MG capsule Take 1 capsule (20 mg total) by mouth daily. 05/17/21  Yes Suellen Durocher, Dionne Bucy, PA-C    ROS: Neg HEENT Neg resp Neg cardiac Neg MS Neg psych Neg neuro  Objective:   Vitals:   05/17/21 1036  BP: 122/80  Pulse: 87  Resp: 16  SpO2: 97%  Weight: 103 lb (46.7 kg)   Exam General appearance : Awake, alert, not in any distress. Speech Clear. Not toxic looking HEENT: Atraumatic and Normocephalic  Neck: Supple, no JVD. No cervical lymphadenopathy.  Chest: Good air entry bilaterally, CTAB.  No rales/rhonchi/wheezing CVS: S1 S2 regular, no murmurs.  Abdomen: Bowel sounds present, with distraction-Non tender and not distended with no gaurding, rigidity or rebound.  Without distraction, every location is tender without rebound or guarding.  No localized TTP Extremities: B/L Lower Ext shows no edema, both legs are warm to touch Neurology: Awake alert, and oriented X 3, CN II-XII intact, Non focal Skin: No Rash  Data Review No results found for: HGBA1C  Assessment & Plan   1. Nausea - POCT URINALYSIS DIP (CLINITEK) - Cervicovaginal ancillary only - POCT urine pregnancy - omeprazole (PRILOSEC) 20 MG capsule; Take 1 capsule (20 mg total) by mouth daily.  Dispense: 30 capsule; Refill: 3  2. Language barrier AMN- "Eyvonne Mechanic"  interpreters used and additional time performing visit was required.   3. Chronic generalized abdominal pain Non acute abdomen-this has been present for more than 2 years without changes.  Consider repeat CT if continues.  UA showed trace leukocytes so I added a culture and 5 days macrobid - H. pylori breath test - Comprehensive metabolic panel - Lipase - Cervicovaginal ancillary only - omeprazole (PRILOSEC) 20 MG capsule; Take 1 capsule (20 mg total) by mouth daily.  Dispense: 30 capsule; Refill: 3  4. Iron deficiency anemia, unspecified iron deficiency anemia type - CBC with Differential/Platelet  5. Irregular periods ?perimenopausal -  FSH/LH  Patient have been counseled extensively about nutrition and exercise. Other issues discussed during this visit include: low cholesterol diet, weight control and daily exercise, foot care, annual eye examinations at Ophthalmology, importance of adherence with medications and regular follow-up. We also discussed long term complications of uncontrolled diabetes and hypertension.   Return in about 3 months (around 08/14/2021) for assign new PCP.;   sooner if needed.  The patient was given clear instructions to go to ER or return to medical center if symptoms don't improve, worsen or new problems develop. The patient verbalized understanding. The patient was told to call to get lab results if they haven't heard anything in the next week.      Freeman Caldron, PA-C [facility], [place]   05/17/2021, 10:57 AM

## 2021-05-18 LAB — CERVICOVAGINAL ANCILLARY ONLY
Bacterial Vaginitis (gardnerella): POSITIVE — AB
Candida Glabrata: NEGATIVE
Candida Vaginitis: NEGATIVE
Chlamydia: POSITIVE — AB
Comment: NEGATIVE
Comment: NEGATIVE
Comment: NEGATIVE
Comment: NEGATIVE
Comment: NEGATIVE
Comment: NORMAL
Neisseria Gonorrhea: NEGATIVE
Trichomonas: NEGATIVE

## 2021-05-19 LAB — COMPREHENSIVE METABOLIC PANEL
ALT: 14 IU/L (ref 0–32)
AST: 17 IU/L (ref 0–40)
Albumin/Globulin Ratio: 2 (ref 1.2–2.2)
Albumin: 5 g/dL — ABNORMAL HIGH (ref 3.8–4.8)
Alkaline Phosphatase: 72 IU/L (ref 44–121)
BUN/Creatinine Ratio: 11 (ref 9–23)
BUN: 8 mg/dL (ref 6–24)
Bilirubin Total: 0.6 mg/dL (ref 0.0–1.2)
CO2: 22 mmol/L (ref 20–29)
Calcium: 9.7 mg/dL (ref 8.7–10.2)
Chloride: 102 mmol/L (ref 96–106)
Creatinine, Ser: 0.72 mg/dL (ref 0.57–1.00)
Globulin, Total: 2.5 g/dL (ref 1.5–4.5)
Glucose: 80 mg/dL (ref 70–99)
Potassium: 4 mmol/L (ref 3.5–5.2)
Sodium: 140 mmol/L (ref 134–144)
Total Protein: 7.5 g/dL (ref 6.0–8.5)
eGFR: 103 mL/min/{1.73_m2} (ref >59)

## 2021-05-19 LAB — CBC WITH DIFFERENTIAL/PLATELET
Basophils Absolute: 0 10*3/uL (ref 0.0–0.2)
Basos: 0 %
EOS (ABSOLUTE): 0.1 10*3/uL (ref 0.0–0.4)
Eos: 1 %
Hematocrit: 40.2 % (ref 34.0–46.6)
Hemoglobin: 13 g/dL (ref 11.1–15.9)
Immature Grans (Abs): 0 10*3/uL (ref 0.0–0.1)
Immature Granulocytes: 0 %
Lymphocytes Absolute: 2.6 10*3/uL (ref 0.7–3.1)
Lymphs: 34 %
MCH: 30 pg (ref 26.6–33.0)
MCHC: 32.3 g/dL (ref 31.5–35.7)
MCV: 93 fL (ref 79–97)
Monocytes Absolute: 0.5 10*3/uL (ref 0.1–0.9)
Monocytes: 6 %
Neutrophils Absolute: 4.5 10*3/uL (ref 1.4–7.0)
Neutrophils: 59 %
Platelets: 261 10*3/uL (ref 150–450)
RBC: 4.34 x10E6/uL (ref 3.77–5.28)
RDW: 14 % (ref 11.7–15.4)
WBC: 7.6 10*3/uL (ref 3.4–10.8)

## 2021-05-19 LAB — URINE CULTURE

## 2021-05-19 LAB — H PYLORI, IGM, IGG, IGA AB
H pylori, IgM Abs: <9 units (ref 0.0–8.9)
H. pylori, IgA Abs: <9 units (ref 0.0–8.9)
H. pylori, IgG AbS: 0.41 Index Value (ref 0.00–0.79)

## 2021-05-19 LAB — FSH/LH
FSH: 4.2 m[IU]/mL
LH: 5 m[IU]/mL

## 2021-05-19 LAB — LIPASE: Lipase: 28 U/L (ref 14–72)

## 2021-05-22 ENCOUNTER — Other Ambulatory Visit: Payer: Self-pay | Admitting: Physician Assistant

## 2021-05-22 DIAGNOSIS — A749 Chlamydial infection, unspecified: Secondary | ICD-10-CM

## 2021-05-22 DIAGNOSIS — N760 Acute vaginitis: Secondary | ICD-10-CM

## 2021-05-22 DIAGNOSIS — B9689 Other specified bacterial agents as the cause of diseases classified elsewhere: Secondary | ICD-10-CM

## 2021-05-22 MED ORDER — AZITHROMYCIN 250 MG PO TABS: ORAL_TABLET | ORAL | 0 refills | Status: DC

## 2021-05-22 MED ORDER — METRONIDAZOLE 500 MG PO TABS: 500.0000 mg | ORAL_TABLET | Freq: Two times a day (BID) | ORAL | 0 refills | Status: DC

## 2021-05-22 MED ORDER — FLUCONAZOLE 150 MG PO TABS: 150.0000 mg | ORAL_TABLET | Freq: Once | ORAL | 0 refills | Status: AC

## 2021-06-22 DIAGNOSIS — H5213 Myopia, bilateral: Secondary | ICD-10-CM | POA: Diagnosis not present

## 2021-07-23 DIAGNOSIS — H524 Presbyopia: Secondary | ICD-10-CM | POA: Diagnosis not present

## 2021-07-23 DIAGNOSIS — H52223 Regular astigmatism, bilateral: Secondary | ICD-10-CM | POA: Diagnosis not present

## 2021-08-14 ENCOUNTER — Other Ambulatory Visit (HOSPITAL_COMMUNITY)
Admission: RE | Admit: 2021-08-14 | Discharge: 2021-08-14 | Disposition: A | Discharge status: Home / Self Care | Payer: Medicaid Other | Source: Ambulatory Visit | Attending: Internal Medicine | Admitting: Internal Medicine

## 2021-08-14 ENCOUNTER — Ambulatory Visit: Payer: Medicaid Other | Attending: Internal Medicine | Admitting: Internal Medicine

## 2021-08-14 ENCOUNTER — Encounter: Payer: Self-pay | Admitting: Internal Medicine

## 2021-08-14 VITALS — BP 117/73 | HR 64 | Resp 16 | Ht <= 58 in | Wt 105.0 lb

## 2021-08-14 DIAGNOSIS — Z1159 Encounter for screening for other viral diseases: Secondary | ICD-10-CM

## 2021-08-14 DIAGNOSIS — Z114 Encounter for screening for human immunodeficiency virus [HIV]: Secondary | ICD-10-CM | POA: Diagnosis not present

## 2021-08-14 DIAGNOSIS — R1031 Right lower quadrant pain: Secondary | ICD-10-CM | POA: Diagnosis not present

## 2021-08-14 DIAGNOSIS — R1032 Left lower quadrant pain: Secondary | ICD-10-CM

## 2021-08-14 DIAGNOSIS — R102 Pelvic and perineal pain: Secondary | ICD-10-CM

## 2021-08-14 LAB — POCT URINALYSIS DIP (CLINITEK)
Bilirubin, UA: NEGATIVE
Glucose, UA: NEGATIVE mg/dL
Ketones, POC UA: NEGATIVE mg/dL
Leukocytes, UA: NEGATIVE
Nitrite, UA: NEGATIVE
POC PROTEIN,UA: NEGATIVE
Spec Grav, UA: 1.02 (ref 1.010–1.025)
Urobilinogen, UA: 0.2 E.U./dL
pH, UA: 7 (ref 5.0–8.0)

## 2021-08-14 MED ORDER — IBUPROFEN 600 MG PO TABS: 600.0000 mg | ORAL_TABLET | Freq: Three times a day (TID) | ORAL | 0 refills | Status: DC | PRN

## 2021-08-14 NOTE — Progress Notes (Signed)
? ? ?Patient ID: Bonnie Conrad, female    DOB: [birth year]  MRN: [id number] ? ?CC:  Abdominal pain ? ?Subjective: ?Bonnie Conrad is a 51 y.o. female who presents for abdominal painHer concerns today include:  ?Patient with history of GERD, MDD, IDA, ? ?Pt last seen by PA [facility] 05/2021 for chronic pelvic and LUQ abdominal pain.  + LE in urine and tested positive for Chlamydia.  She was treated with Zithromax 250 mg 4 tabs x 1 for the Chlamydia and Microbid for possible UTI ? ?Pt c/o "pain in the ovaries" x 3 wks. She points to pelvic area both sides. ?Pain is constant and worse with activities and when she does a lot of walking ?Just started working on wkends 6 wks ago doing cleaning.  Has to lift heavy trash bags and heavy mops.  Thinks this may have something to do with her pain. Pain also worse during intercourse.  ?Pain not affected by food.  Occasional nausea.  Moving bowels okay.  No fever. ?Some burning with urination at times. Slight burning with urination this a.m.  + LT flank pain ?No abnormal vaginal dischg at this time.  She completed abx for Chlamydia dx 05/2021.  Her partner was not treated; "since we don't live together, he did not want to come."  She endorses having unprotected sex with him since then.  ?Rates pain 6-7/10.  Not using anything for pain ?LNMP was 1st wk of April.  Usually last 5 days. Sometimes she gets cycle 2x/mth.  She has had tubal ligation 2013.   ?No more pain in LUQ. Omeprazole has helped a lot with that ? ?HM: Agrees to hepatitis C and HIV screening. ?Patient Active Problem List  ? Diagnosis Date Noted  ? Hiatal hernia 08/15/2018  ? History of hernia repair 08/15/2018  ? Pain at surgical site 08/15/2018  ? Iron deficiency anemia 06/02/2018  ? Dysphagia 06/02/2018  ? History of colonic polyps 06/02/2018  ? Ventral hernia without obstruction or gangrene 06/02/2018  ? Abnormal CT of the abdomen 03/09/2018  ? Non-intractable vomiting 03/09/2018  ? Chronic  generalized abdominal pain 03/09/2018  ? Gastroesophageal reflux disease 03/09/2018  ? Rectal bleeding 03/09/2018  ? Hemorrhoids 03/09/2018  ? Major depressive disorder, recurrent episode, severe, with psychotic behavior ([place]) 04/03/2011  ? Severe major depression with psychotic features ([place]) 03/06/2011  ? Hernia of abdominal cavity 03/06/2011  ?  ? ?Current Outpatient Medications on File Prior to Visit  ?Medication Sig Dispense Refill  ? omeprazole (PRILOSEC) 20 MG capsule Take 1 capsule (20 mg total) by mouth daily. 30 capsule 3  ? ?No current facility-administered medications on file prior to visit.  ? ? ?Allergies  ?Allergen Reactions  ? Penicillins Swelling  ?  Did it involve swelling of the face/tongue/throat, SOB, or low BP? Yes ?Did it involve sudden or severe rash/hives, skin peeling, or any reaction on the inside of your mouth or nose? No ?Did you need to seek medical attention at a hospital or doctor's office? No ?When did it last happen?  1 or 2 years ago     ?If all above answers are "NO", may proceed with cephalosporin use. ?  ? Vicodin [Hydrocodone-Acetaminophen] Hives and Itching  ? Morphine And Related Rash  ?  Nervous,itching  ? ? ?Social History  ? ?Socioeconomic History  ? Marital status: Married  ?  Spouse name: Not on file  ? Number of children: Not on file  ? Years of education: Not on file  ?  Highest education level: Not on file  ?Occupational History  ? Occupation: part time housekeeper   ?Tobacco Use  ? Smoking status: Some Days  ?  Types: Cigarettes  ? Smokeless tobacco: Never  ?Vaping Use  ? Vaping Use: Never used  ?Substance and Sexual Activity  ? Alcohol use: No  ? Drug use: No  ? Sexual activity: Yes  ?  Birth control/protection: None  ?  Comment: tubal  ?Other Topics Concern  ? Not on file  ?Social History Narrative  ? Not on file  ? ?Social Determinants of Health  ? ?Financial Resource Strain: Not on file  ?Food Insecurity: Not on file  ?Transportation Needs: Not on file  ?Physical  Activity: Not on file  ?Stress: Not on file  ?Social Connections: Not on file  ?Intimate Partner Violence: Not on file  ? ? ?Family History  ?Problem Relation Age of Onset  ? Hypertension Mother   ? Anesthesia problems Neg Hx   ? Hypotension Neg Hx   ? Malignant hyperthermia Neg Hx   ? Pseudochol deficiency Neg Hx   ? Colon cancer Neg Hx   ? Esophageal cancer Neg Hx   ? Inflammatory bowel disease Neg Hx   ? Liver disease Neg Hx   ? Pancreatic cancer Neg Hx   ? Rectal cancer Neg Hx   ? Stomach cancer Neg Hx   ? Colon polyps Neg Hx   ? ? ?Past Surgical History:  ?Procedure Laterality Date  ? COLONOSCOPY WITH ESOPHAGOGASTRODUODENOSCOPY (EGD)    ? DILATION AND CURETTAGE OF UTERUS    ? EPIGASTRIC HERNIA REPAIR N/A 06/05/2018  ? Procedure: OPEN REPAIR OF INCARCERATED EPIGASTRIC HERNIA WITH MESH;  Surgeon: Clovis Riley, MD;  Location: [facility];  Service: General;  Laterality: N/A;  ? ESOPHAGEAL MANOMETRY N/A 06/29/2018  ? Procedure: ESOPHAGEAL MANOMETRY (EM);  Surgeon: Mauri Pole, MD;  Location: [facility];  Service: Endoscopy;  Laterality: N/A;  ? TUBAL LIGATION  07/25/2011  ? Procedure: POST PARTUM TUBAL LIGATION;  Surgeon: Mora Bellman, MD;  Location: [facility];  Service: Gynecology;  Laterality: Bilateral;  ? ? ?ROS: ?Review of Systems ?Negative except as stated above ? ?PHYSICAL EXAM: ?BP 117/73   Pulse 64   Resp 16   Ht 4' 9.5" (1.461 m)   Wt 105 lb (47.6 kg)   LMP 07/16/2021   SpO2 96%   BMI 22.33 kg/m?   ?Wt Readings from Last 3 Encounters:  ?08/14/21 105 lb (47.6 kg)  ?05/17/21 103 lb (46.7 kg)  ?02/22/20 102 lb 3.2 oz (46.4 kg)  ? ? ?Physical Exam ? ?General appearance - alert, well appearing, and in no distress ?Mental status - normal mood, behavior, speech, dress, motor activity, and thought processes ?Abdomen - normal bowel sounds.  Non-distended, soft, mild BL lower quad and suprapubic tenderness with guarding.  Mild LT flank tenderness ?Pelvic -RN Lytle Michaels via Marland Kitchen present: No external  vaginal lesions.  Cervix appears grossly normal.  No abnormal appearing discharge in the vaginal vault.  Positive cervical motion tenderness.  No adnexal masses.  Uterus felt top normal size.  Very tender to touch over the uterus and in both adnexal areas. ? ? ? ?  Latest Ref Rng & Units 05/17/2021  ? 11:37 AM 10/29/2019  ?  3:23 PM 04/14/2019  ?  2:55 PM  ?CMP  ?Glucose 70 - 99 mg/dL 80   79   99    ?BUN 6 - 24 mg/dL 8   10  14    ?Creatinine 0.57 - 1.00 mg/dL 0.72   0.63   0.87    ?Sodium 134 - 144 mmol/L 140   139   137    ?Potassium 3.5 - 5.2 mmol/L 4.0   3.4   3.6    ?Chloride 96 - 106 mmol/L 102   108   105    ?CO2 20 - 29 mmol/L '22   21   23    '$ ?Calcium 8.7 - 10.2 mg/dL 9.7   8.9   9.2    ?Total Protein 6.0 - 8.5 g/dL 7.5   6.8   7.2    ?Total Bilirubin 0.0 - 1.2 mg/dL 0.6   0.2   1.0    ?Alkaline Phos 44 - 121 IU/L 72   51   53    ?AST 0 - 40 IU/L '17   16   20    '$ ?ALT 0 - 32 IU/L '14   13   18    '$ ? ?Lipid Panel  ?No results found for: CHOL, TRIG, HDL, CHOLHDL, VLDL, LDLCALC, LDLDIRECT ? ?CBC ?   ?Component Value Date/Time  ? WBC 7.6 05/17/2021 1137  ? WBC 8.7 10/29/2019 1523  ? RBC 4.34 05/17/2021 1137  ? RBC 4.05 10/29/2019 1523  ? HGB 13.0 05/17/2021 1137  ? HCT 40.2 05/17/2021 1137  ? PLT 261 05/17/2021 1137  ? MCV 93 05/17/2021 1137  ? MCH 30.0 05/17/2021 1137  ? MCH 29.9 10/29/2019 1523  ? MCHC 32.3 05/17/2021 1137  ? MCHC 31.8 10/29/2019 1523  ? RDW 14.0 05/17/2021 1137  ? LYMPHSABS 2.6 05/17/2021 1137  ? MONOABS 0.6 06/03/2018 1417  ? EOSABS 0.1 05/17/2021 1137  ? BASOSABS 0.0 05/17/2021 1137  ? ?Results for orders placed or performed in visit on 08/14/21  ?POCT URINALYSIS DIP (CLINITEK)  ?Result Value Ref Range  ? Color, UA yellow yellow  ? Clarity, UA clear clear  ? Glucose, UA negative negative mg/dL  ? Bilirubin, UA negative negative  ? Ketones, POC UA negative negative mg/dL  ? Spec Grav, UA 1.020 1.010 - 1.025  ? Blood, UA small (A) negative  ? pH, UA 7.0 5.0 - 8.0  ? POC PROTEIN,UA negative  negative, trace  ? Urobilinogen, UA 0.2 0.2 or 1.0 E.U./dL  ? Nitrite, UA Negative Negative  ? Leukocytes, UA Negative Negative  ? ? ?ASSESSMENT AND PLAN: ? ?1. Acute bilateral lower abdominal pain ?Differential diagnoses includ

## 2021-08-14 NOTE — Progress Notes (Signed)
RLQ  pain ?2 weeks, intermittent,  ?No OTC medications to help ?7/10  ?LMP- beginning of April. NO cycle as of yet.  ?________________________________________________ ? ? ? ?

## 2021-08-15 ENCOUNTER — Telehealth: Payer: Self-pay

## 2021-08-15 LAB — HIV ANTIBODY (ROUTINE TESTING W REFLEX): HIV Screen 4th Generation wRfx: NONREACTIVE

## 2021-08-15 LAB — HEPATITIS C ANTIBODY: Hep C Virus Ab: NONREACTIVE

## 2021-08-15 NOTE — Telephone Encounter (Signed)
[employer] perla  Id#  [phone] contacted pt to go over lab results  pt is aware and doesn't have any questions or concerns  ?

## 2021-08-16 LAB — CERVICOVAGINAL ANCILLARY ONLY
Bacterial Vaginitis (gardnerella): NEGATIVE
Candida Glabrata: NEGATIVE
Candida Vaginitis: NEGATIVE
Chlamydia: NEGATIVE
Comment: NEGATIVE
Comment: NEGATIVE
Comment: NEGATIVE
Comment: NEGATIVE
Comment: NEGATIVE
Comment: NORMAL
Neisseria Gonorrhea: NEGATIVE
Trichomonas: NEGATIVE

## 2021-08-16 LAB — URINE CULTURE: Organism ID, Bacteria: NO GROWTH

## 2021-08-28 ENCOUNTER — Ambulatory Visit (HOSPITAL_COMMUNITY): Payer: Medicaid Other

## 2021-10-05 IMAGING — US US TRANSVAGINAL NON-OB
1 series · 15 of 23 positions shown · non-contrast
Comparison: 04/14/2019

CLINICAL DATA: Pelvic pain in a female; LMP 04/13/2019

EXAM:
ULTRASOUND PELVIS TRANSVAGINAL
TECHNIQUE: Transvaginal ultrasound examination of the pelvis was performed
including evaluation of the uterus, ovaries, adnexal regions, and
pelvic cul-de-sac.

[Series 1: us transvaginal non-ob · 15 of 23 slices shown]
[im 1/23]
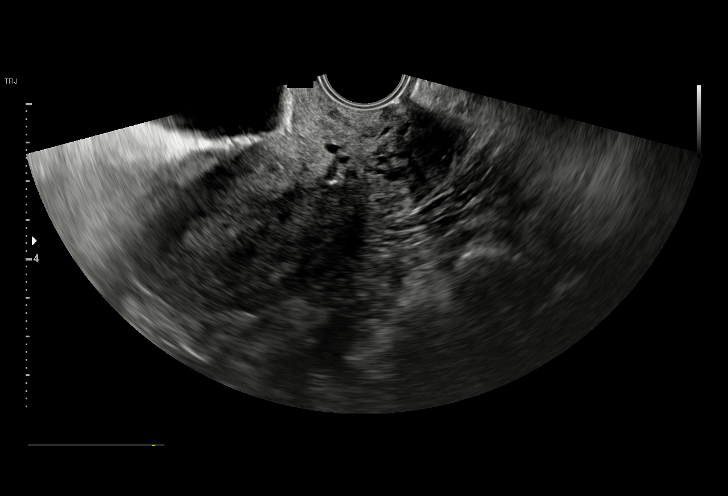
[im 3/23]
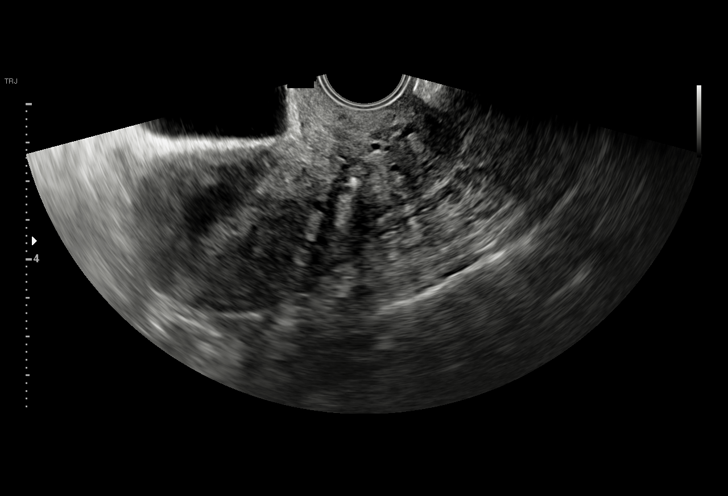
[im 4/23]
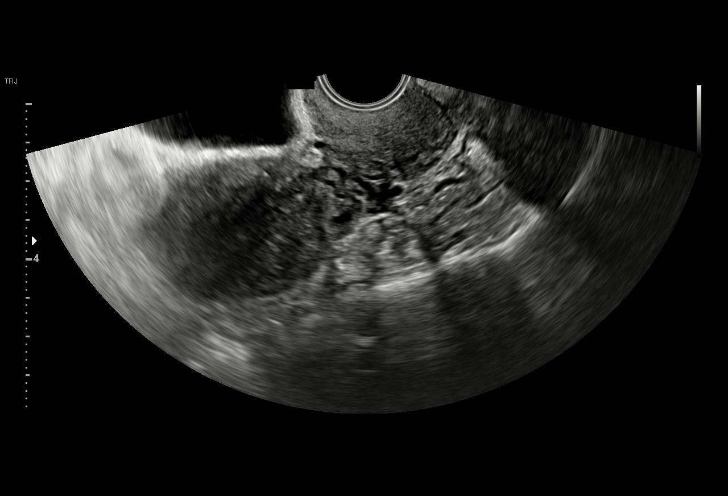
[im 6/23]
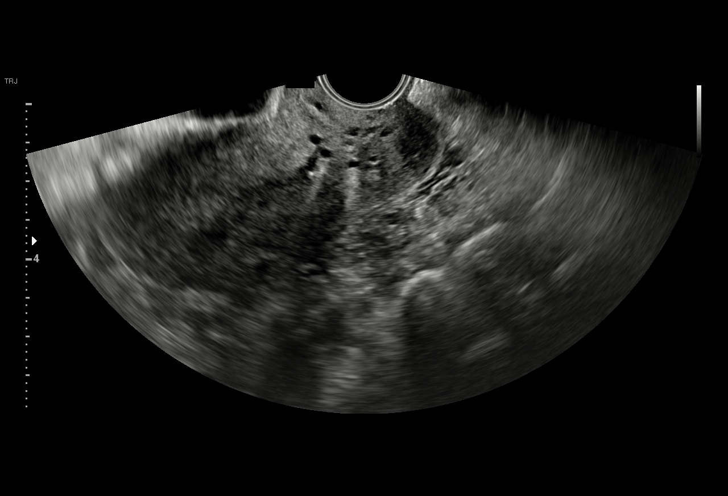
[im 7/23]
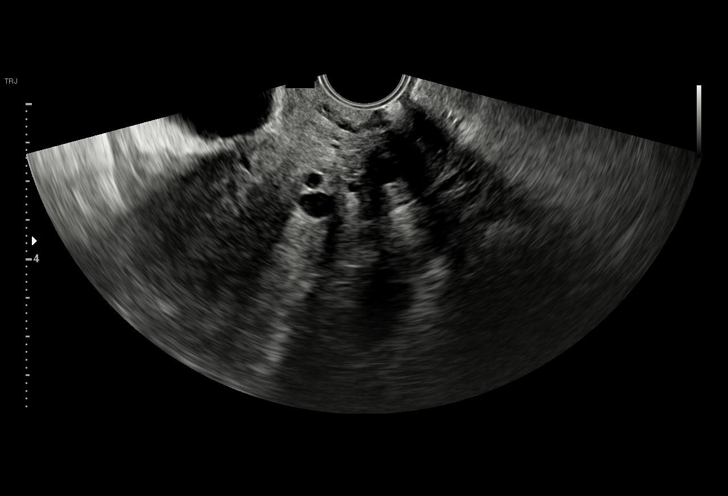
[im 9/23]
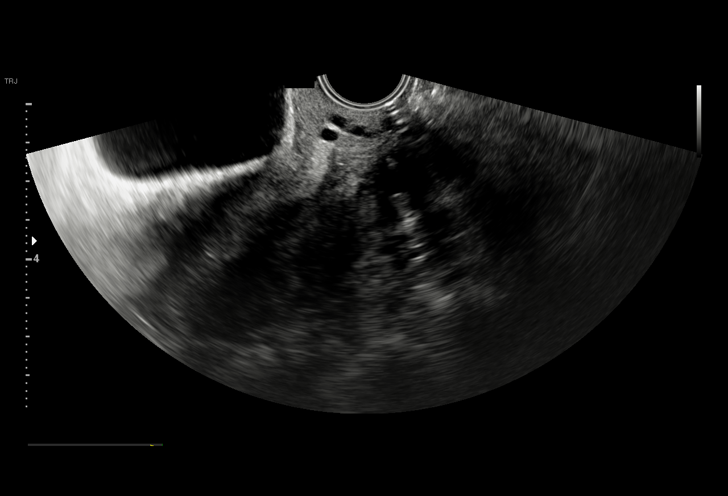
[im 10/23]
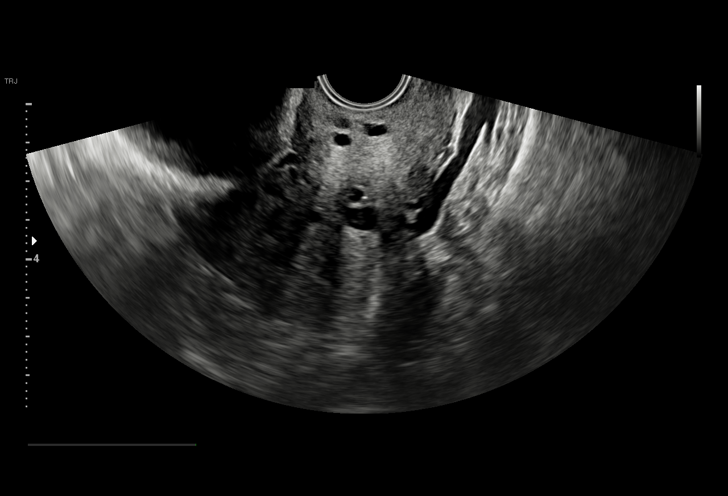
[im 12/23]
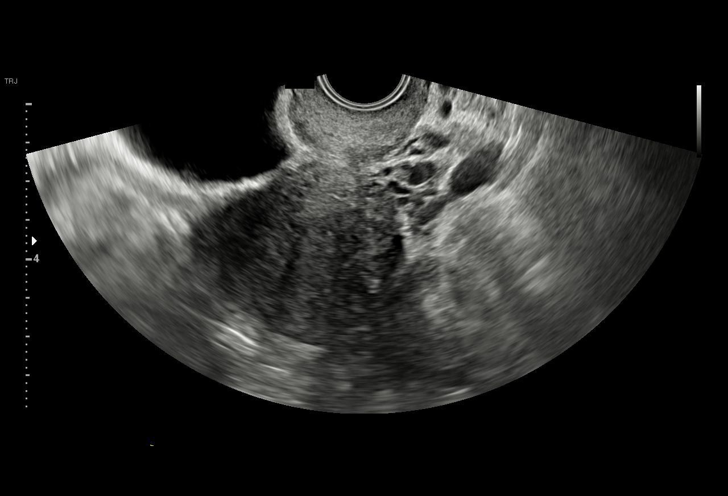
[im 14/23]
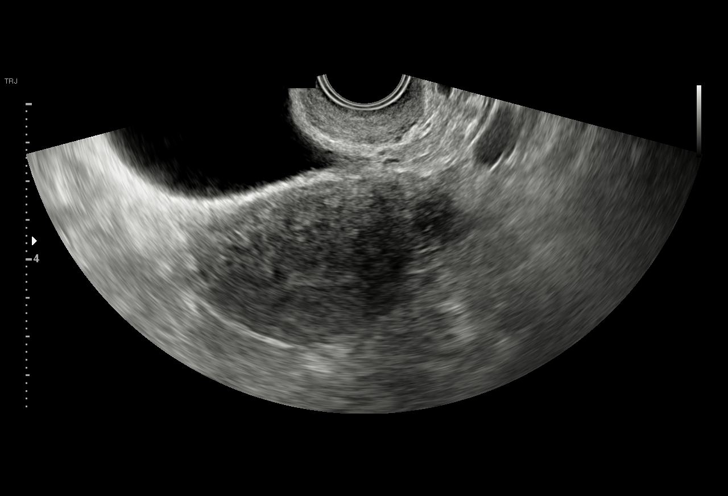
[im 15/23]
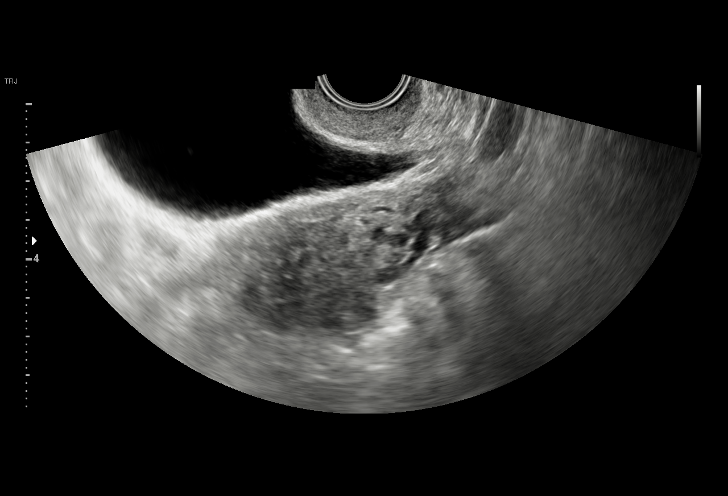
[im 17/23]
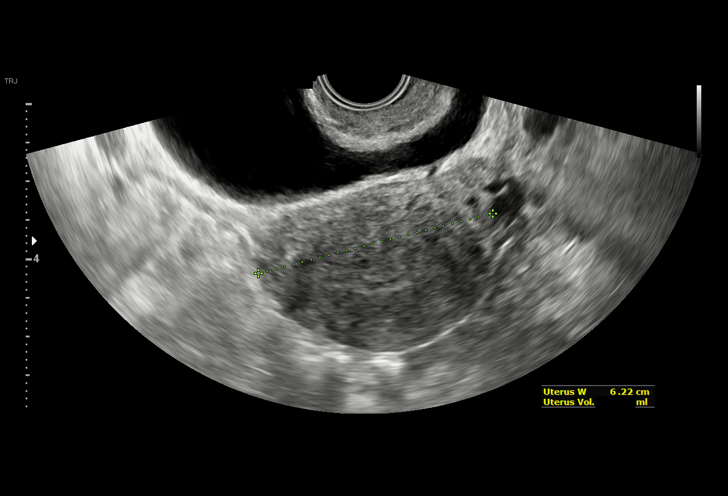
[im 18/23]
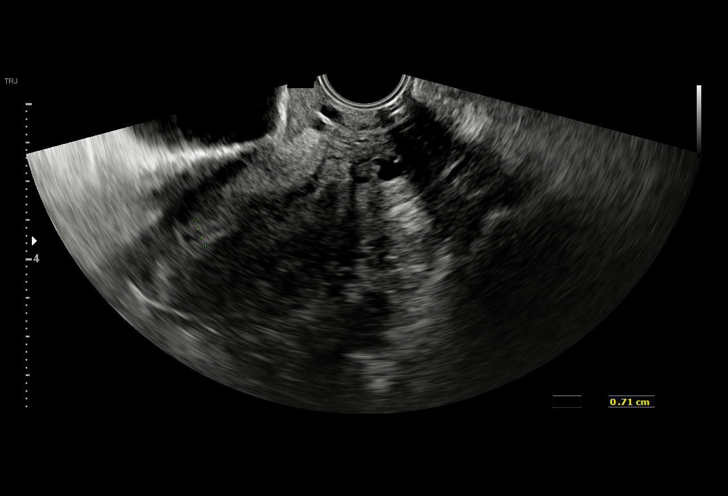
[im 20/23]
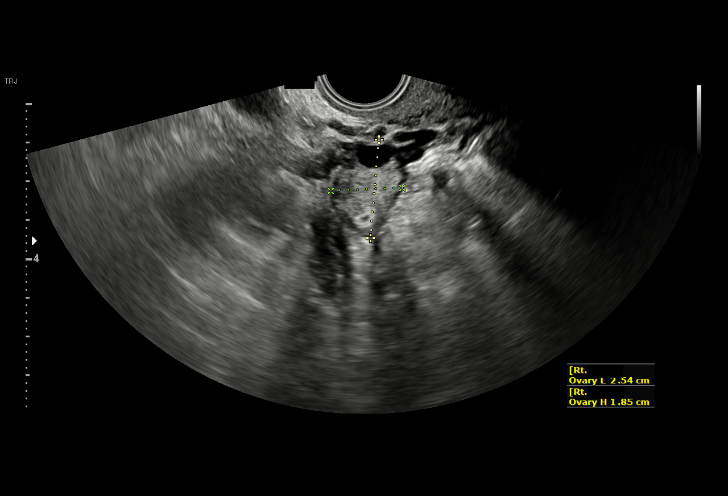
[im 21/23]
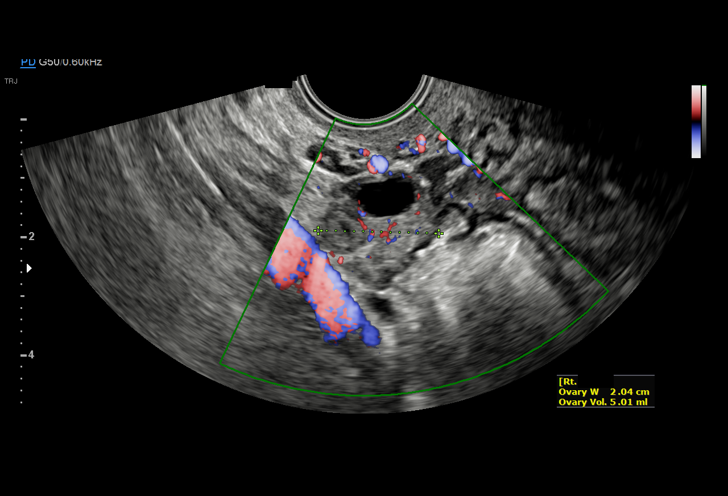
[im 23/23]
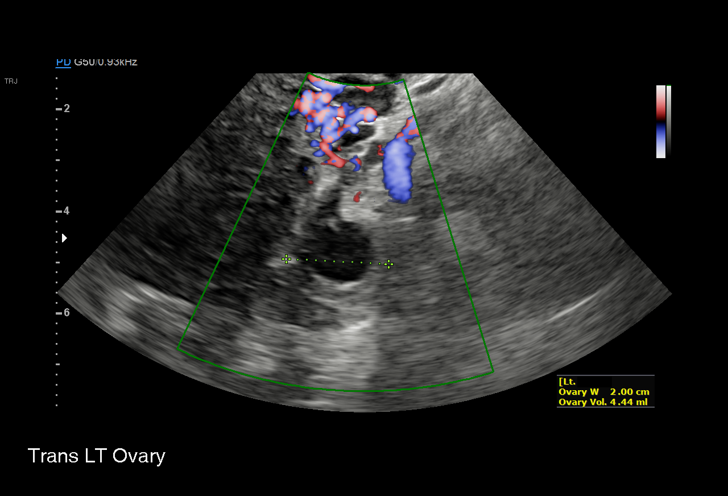

[15 of 23 positions shown; findings below may reference images not displayed]

FINDINGS: Uterus

Measurements: 8.9 x 3.8 x 6.2 cm = volume: 109 mL. Anteverted.
Normal morphology without mass

Endometrium

Thickness: 7 mm. Tiny amount of endometrial fluid at upper uterine
segment. Poorly visualized at mid uterus.

Right ovary

Measurements: At 2.5 x 1.9 x 2.0 cm = volume: 5.0 mL. Dominant
follicle without mass

Left ovary

Measurements: 2.5 x 1.7 x 2.0 cm = volume: 4.4 mL. Dominant follicle
without mass

Other findings: No free pelvic fluid. No adnexal masses. Nabothian
cysts noted at cervix.
IMPRESSION: Tiny amount of nonspecific endometrial fluid at uterine fundus.

Otherwise negative exam.

## 2021-11-11 ENCOUNTER — Emergency Department (HOSPITAL_COMMUNITY): Payer: Medicaid Other

## 2021-11-11 ENCOUNTER — Emergency Department (HOSPITAL_COMMUNITY)
Admission: EM | Admit: 2021-11-11 | Discharge: 2021-11-12 | Disposition: A | Discharge status: Home / Self Care | Payer: Medicaid Other | Attending: Emergency Medicine | Admitting: Emergency Medicine

## 2021-11-11 DIAGNOSIS — U071 COVID-19: Secondary | ICD-10-CM | POA: Insufficient documentation

## 2021-11-11 DIAGNOSIS — R0602 Shortness of breath: Secondary | ICD-10-CM | POA: Diagnosis not present

## 2021-11-11 DIAGNOSIS — R509 Fever, unspecified: Secondary | ICD-10-CM | POA: Diagnosis present

## 2021-11-11 LAB — SARS CORONAVIRUS 2 BY RT PCR: SARS Coronavirus 2 by RT PCR: POSITIVE — AB

## 2021-11-11 NOTE — ED Triage Notes (Signed)
Pt c/o fever, HA/sinus pressure since last night, dry cough. Ibuprofen, tamiflu w no relief. Last med 2p

## 2021-11-12 MED ORDER — MOLNUPIRAVIR EUA 200MG CAPSULE: 4.0000 | ORAL_CAPSULE | Freq: Two times a day (BID) | ORAL | 0 refills | Status: AC

## 2021-11-12 MED ORDER — BENZONATATE 100 MG PO CAPS: 100.0000 mg | ORAL_CAPSULE | Freq: Two times a day (BID) | ORAL | 0 refills | Status: DC | PRN

## 2021-11-12 MED ORDER — ACETAMINOPHEN 500 MG PO TABS
1000.0000 mg | ORAL_TABLET | Freq: Once | ORAL | Status: AC
  Administered 2021-11-12: 1000 mg via ORAL
  Filled 2021-11-12: qty 2

## 2021-11-12 NOTE — ED Provider Notes (Signed)
[facility] Emergency Department Provider Note MRN:  [id number]  Arrival date & time: 11/12/21     Chief Complaint   Fever and Generalized Body Aches (/)   History of Present Illness   Bonnie Conrad is a 51 y.o. year-old female presents to the ED with chief complaint of cough and body aches.  States that she has been sick for the past 2 days.  Has had other sick contacts at home.  Has taken Tylenol with some improvement.  History provided by patient.   Review of Systems  Pertinent review of systems noted in HPI.    Physical Exam   Vitals:   11/11/21 2202 11/12/21 0118  BP: 121/75 111/64  Pulse: (!) 111 (!) 103  Resp: 18 20  Temp: (!) 100.5 F (38.1 C) 100 F (37.8 C)  SpO2: 96% 99%    CONSTITUTIONAL:  well-appearing, NAD NEURO:  Alert and oriented x 3, CN 3-12 grossly intact EYES:  eyes equal and reactive ENT/NECK:  Supple, no stridor  CARDIO:  tachycardia, regular rhythm, appears well-perfused  PULM:  No respiratory distress,  GI/GU:  non-distended,  MSK/SPINE:  No gross deformities, no edema, moves all extremities  SKIN:  no rash, atraumatic   *Additional and/or pertinent findings included in MDM below  Diagnostic and Interventional Summary    EKG Interpretation  Date/Time:    Ventricular Rate:    PR Interval:    QRS Duration:   QT Interval:    QTC Calculation:   R Axis:     Text Interpretation:         Labs Reviewed  SARS CORONAVIRUS 2 BY RT PCR - Abnormal; Notable for the following components:      Result Value   SARS Coronavirus 2 by RT PCR POSITIVE (*)    All other components within normal limits    DG Chest 2 View  Final Result      Medications  acetaminophen (TYLENOL) tablet 1,000 mg (has no administration in time range)     Procedures  /  Critical Care Procedures  ED Course and Medical Decision Making  I have reviewed the triage vital signs, the nursing notes, and pertinent available records  from the EMR.  Social Determinants Affecting Complexity of Care: Patient has no clinically significant social determinants affecting this chief complaint..   ED Course:   Patient here with cough.  Top differential diagnoses include CAP, COVID, URI. Medical Decision Making Amount and/or Complexity of Data Reviewed Labs:     Details: COVID positive Radiology: ordered and independent interpretation performed.    Details: no obvious opacity  Risk OTC drugs. Prescription drug management.     Consultants: No consultations were needed in caring for this patient.   Treatment and Plan: Emergency department workup does not suggest an emergent condition requiring admission or immediate intervention beyond  what has been performed at this time. The patient is safe for discharge and has  been instructed to return immediately for worsening symptoms, change in  symptoms or any other concerns    Final Clinical Impressions(s) / ED Diagnoses     ICD-10-CM   1. COVID-19  U07.1       ED Discharge Orders          Ordered    molnupiravir EUA (LAGEVRIO) 200 mg CAPS capsule  2 times daily        11/12/21 0147    benzonatate (TESSALON) 100 MG capsule  2 times daily PRN  11/12/21 0147              Discharge Instructions Discussed with and Provided to Patient:   Discharge Instructions   None      Montine Circle, PA-C 11/12/21 [id number]    Ripley Fraise, MD 11/12/21 [phone]

## 2022-09-24 ENCOUNTER — Encounter: Payer: Self-pay | Admitting: Internal Medicine

## 2022-09-24 ENCOUNTER — Other Ambulatory Visit: Payer: Self-pay | Admitting: Internal Medicine

## 2022-09-24 ENCOUNTER — Ambulatory Visit: Payer: Medicaid Other | Attending: Internal Medicine | Admitting: Internal Medicine

## 2022-09-24 VITALS — BP 130/70 | HR 62 | Temp 98.3°F | Ht <= 58 in | Wt 102.0 lb

## 2022-09-24 DIAGNOSIS — Z23 Encounter for immunization: Secondary | ICD-10-CM | POA: Diagnosis not present

## 2022-09-24 DIAGNOSIS — S1093XA Contusion of unspecified part of neck, initial encounter: Secondary | ICD-10-CM

## 2022-09-24 DIAGNOSIS — S0093XA Contusion of unspecified part of head, initial encounter: Secondary | ICD-10-CM | POA: Diagnosis not present

## 2022-09-24 DIAGNOSIS — G44311 Acute post-traumatic headache, intractable: Secondary | ICD-10-CM

## 2022-09-24 MED ORDER — CYCLOBENZAPRINE HCL 5 MG PO TABS: 5.0000 mg | ORAL_TABLET | Freq: Every day | ORAL | 0 refills | Status: DC | PRN

## 2022-09-24 MED ORDER — IBUPROFEN 600 MG PO TABS: 600.0000 mg | ORAL_TABLET | Freq: Two times a day (BID) | ORAL | 0 refills | Status: DC | PRN

## 2022-09-24 NOTE — Progress Notes (Signed)
Patient ID: Bonnie Conrad, female    DOB: 25-Dec-1970  MRN: [id number]  CC: Annual Exam (Physical. Med refill. /Head injury at work X4 days ago. Pain radiating to R neck, swelling )   Subjective: Bonnie Conrad is a 52 y.o. female who presents for annual.  However we deferred on the annual exam until next visit that she had an acute issue going on today that needed to be addressed. Her concerns today include:  Patient with history of GERD, MDD, IDA,   [employer] Language interpreter used during this encounter. #Emmanuel [id number]  Pt reports head injury 4 days ago at work causing HA and scalp pain.  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  Hit LT side of forehead and frontal scalp against the bar which knocked her backwards but did not fall. No LOC.  Sustained a small laceration which caused a lot of bleeding.  She was not seen in the emergency room. Did informed employee about it.  He placed ice on it and gave her some ASA. Still having pain over LT forehead that radiates over LT side of head and into the neck. Endorses LT sided headache and dizziness when she stands up Endorses little blurred vision.  No N/V.  She has been taking ibuprofen 600 mg.  She takes about 1 a day.  She is off for the next 4 days.  Patient Active Problem List   Diagnosis Date Noted   Hiatal hernia 08/15/2018   History of hernia repair 08/15/2018   Pain at surgical site 08/15/2018   Iron deficiency anemia 06/02/2018   Dysphagia 06/02/2018   History of colonic polyps 06/02/2018   Ventral hernia without obstruction or gangrene 06/02/2018   Abnormal CT of the abdomen 03/09/2018   Non-intractable vomiting 03/09/2018   Chronic generalized abdominal pain 03/09/2018   Gastroesophageal reflux disease 03/09/2018   Rectal bleeding 03/09/2018   Hemorrhoids 03/09/2018   Major depressive disorder, recurrent episode, severe, with psychotic behavior (HCC)  04/03/2011   Severe major depression with psychotic features (HCC) 03/06/2011   Hernia of abdominal cavity 03/06/2011     Current Outpatient Medications on File Prior to Visit  Medication Sig Dispense Refill   omeprazole (PRILOSEC) 20 MG capsule Take 1 capsule (20 mg total) by mouth daily. 30 capsule 3   No current facility-administered medications on file prior to visit.    Allergies  Allergen Reactions   Penicillins Swelling    Did it involve swelling of the face/tongue/throat, SOB, or low BP? Yes Did it involve sudden or severe rash/hives, skin peeling, or any reaction on the inside of your mouth or nose? No Did you need to seek medical attention at a hospital or doctor's office? No When did it last happen?  1 or 2 years ago     If all above answers are "NO", may proceed with cephalosporin use.    Vicodin [Hydrocodone-Acetaminophen] Hives and Itching   Morphine And Codeine Rash    Nervous,itching    Social History   Socioeconomic History   Marital status: Married    Spouse name: Not on file   Number of children: Not on file   Years of education: Not on file   Highest education level: Not on file  Occupational History   Occupation: part time housekeeper   Tobacco Use   Smoking status: Some Days    Types: Cigarettes   Smokeless tobacco: Never  Vaping Use  Vaping Use: Never used  Substance and Sexual Activity   Alcohol use: No   Drug use: No   Sexual activity: Yes    Birth control/protection: None    Comment: tubal  Other Topics Concern   Not on file  Social History Narrative   Not on file   Social Determinants of Health   Financial Resource Strain: Not on file  Food Insecurity: No Food Insecurity (12/17/2019)   Hunger Vital Sign    Worried About Running Out of Food in the Last Year: Never true    Ran Out of Food in the Last Year: Never true  Transportation Needs: No Transportation Needs (12/17/2019)   PRAPARE - Administrator, Civil Service  (Medical): No    Lack of Transportation (Non-Medical): No  Physical Activity: Not on file  Stress: Not on file  Social Connections: Not on file  Intimate Partner Violence: Not on file    Family History  Problem Relation Age of Onset   Hypertension Mother    Anesthesia problems Neg Hx    Hypotension Neg Hx    Malignant hyperthermia Neg Hx    Pseudochol deficiency Neg Hx    Colon cancer Neg Hx    Esophageal cancer Neg Hx    Inflammatory bowel disease Neg Hx    Liver disease Neg Hx    Pancreatic cancer Neg Hx    Rectal cancer Neg Hx    Stomach cancer Neg Hx    Colon polyps Neg Hx     Past Surgical History:  Procedure Laterality Date   COLONOSCOPY WITH ESOPHAGOGASTRODUODENOSCOPY (EGD)     DILATION AND CURETTAGE OF UTERUS     EPIGASTRIC HERNIA REPAIR N/A 06/05/2018   Procedure: OPEN REPAIR OF INCARCERATED EPIGASTRIC HERNIA WITH MESH;  Surgeon: Berna Bue, MD;  Location: [facility];  Service: General;  Laterality: N/A;   ESOPHAGEAL MANOMETRY N/A 06/29/2018   Procedure: ESOPHAGEAL MANOMETRY (EM);  Surgeon: Napoleon Form, MD;  Location: [facility];  Service: Endoscopy;  Laterality: N/A;   TUBAL LIGATION  07/25/2011   Procedure: POST PARTUM TUBAL LIGATION;  Surgeon: Catalina Antigua, MD;  Location: [facility];  Service: Gynecology;  Laterality: Bilateral;    ROS: Review of Systems Negative except as stated above  PHYSICAL EXAM: BP 130/70 (BP Location: Left Arm, Patient Position: Sitting, Cuff Size: Normal)   Pulse 62   Temp 98.3 F (36.8 C) (Oral)   Ht 4\' 9"  (1.448 m)   Wt 102 lb (46.3 kg)   SpO2 100%   BMI 22.07 kg/m   Physical Exam  General appearance - alert, well appearing, middle-age Hispanic female and in no distress Mental status - normal mood, behavior, speech, dress, motor activity, and thought processes Neurological - alert, oriented, normal speech, no focal findings or movement disorder noted, cranial nerves II through XII intact, motor and sensory grossly  normal bilaterally.  Gait is normal. Skin -patient has what looks like this slightly swollen area over the left forehead about 4 to 5 cm in size.  She has dried blood and scab in the left frontal area.  Scalp is exquisitely tender to touch.  She also has tenderness in the left temporal, parietal and occipital areas.  Mild to moderate tenderness in the soft tissue of the neck on the left side.  Mild to moderate discomfort with passive range of motion of the neck.      Latest Ref Rng & Units 05/17/2021   11:37 AM 10/29/2019  3:23 PM 04/14/2019    2:55 PM  CMP  Glucose 70 - 99 mg/dL 80  79  99   BUN 6 - 24 mg/dL 8  10  14    Creatinine 0.57 - 1.00 mg/dL 1.61  0.96  0.45   Sodium 134 - 144 mmol/L 140  139  137   Potassium 3.5 - 5.2 mmol/L 4.0  3.4  3.6   Chloride 96 - 106 mmol/L 102  108  105   CO2 20 - 29 mmol/L 22  21  23    Calcium 8.7 - 10.2 mg/dL 9.7  8.9  9.2   Total Protein 6.0 - 8.5 g/dL 7.5  6.8  7.2   Total Bilirubin 0.0 - 1.2 mg/dL 0.6  0.2  1.0   Alkaline Phos 44 - 121 IU/L 72  51  53   AST 0 - 40 IU/L 17  16  20    ALT 0 - 32 IU/L 14  13  18     Lipid Panel  No results found for: "CHOL", "TRIG", "HDL", "CHOLHDL", "VLDL", "LDLCALC", "LDLDIRECT"  CBC    Component Value Date/Time   WBC 7.6 05/17/2021 1137   WBC 8.7 10/29/2019 1523   RBC 4.34 05/17/2021 1137   RBC 4.05 10/29/2019 1523   HGB 13.0 05/17/2021 1137   HCT 40.2 05/17/2021 1137   PLT 261 05/17/2021 1137   MCV 93 05/17/2021 1137   MCH 30.0 05/17/2021 1137   MCH 29.9 10/29/2019 1523   MCHC 32.3 05/17/2021 1137   MCHC 31.8 10/29/2019 1523   RDW 14.0 05/17/2021 1137   LYMPHSABS 2.6 05/17/2021 1137   MONOABS 0.6 06/03/2018 1417   EOSABS 0.1 05/17/2021 1137   BASOSABS 0.0 05/17/2021 1137    ASSESSMENT AND PLAN: 1. Contusion of head and neck region Advised patient to use warm compresses to the forehead.  We will get a CAT scan of the head.  Given prescription for ibuprofen and Flexeril to use as needed.  Advised  that the Flexeril can cause some drowsiness.  Advised to be seen in the emergency room if headache becomes very severe or if she develops any vomiting with the headache. - CT HEAD WO CONTRAST ( ) - ibuprofen (ADVIL) 600 MG tablet; Take 1 tablet (600 mg total) by mouth 2 (two) times daily as needed for headache. Take with food  Dispense: 30 tablet; Refill: 0 - cyclobenzaprine (FLEXERIL) 5 MG tablet; Take 1 tablet (5 mg total) by mouth daily as needed for muscle spasms.  Dispense: 30 tablet; Refill: 0  2. Intractable acute post-traumatic headache See #1 above. - CT HEAD WO CONTRAST ( ) - ibuprofen (ADVIL) 600 MG tablet; Take 1 tablet (600 mg total) by mouth 2 (two) times daily as needed for headache. Take with food  Dispense: 30 tablet; Refill: 0 - cyclobenzaprine (FLEXERIL) 5 MG tablet; Take 1 tablet (5 mg total) by mouth daily as needed for muscle spasms.  Dispense: 30 tablet; Refill: 0  3. Need for Tdap vaccination Given today.    Patient was given the opportunity to ask questions.  Patient verbalized understanding of the plan and was able to repeat key elements of the plan.   This documentation was completed using Paediatric nurse.  Any transcriptional errors are unintentional.  Orders Placed This Encounter  Procedures   CT HEAD WO CONTRAST ( )   Tdap vaccine greater than or equal to 7yo IM     Requested Prescriptions   Signed Prescriptions Disp Refills   ibuprofen (ADVIL)  600 MG tablet 30 tablet 0    Sig: Take 1 tablet (600 mg total) by mouth 2 (two) times daily as needed for headache. Take with food   cyclobenzaprine (FLEXERIL) 5 MG tablet 30 tablet 0    Sig: Take 1 tablet (5 mg total) by mouth daily as needed for muscle spasms.    Return in about 7 weeks (around 11/12/2022) for physical.  Jonah Blue, MD, Jerrel Ivory

## 2022-09-25 ENCOUNTER — Ambulatory Visit
Admission: RE | Admit: 2022-09-25 | Discharge: 2022-09-25 | Disposition: A | Discharge status: Home / Self Care | Payer: Medicaid Other | Source: Ambulatory Visit | Attending: Internal Medicine | Admitting: Internal Medicine

## 2022-09-25 DIAGNOSIS — G44309 Post-traumatic headache, unspecified, not intractable: Secondary | ICD-10-CM | POA: Diagnosis not present

## 2022-11-12 ENCOUNTER — Encounter: Payer: Self-pay | Admitting: Internal Medicine

## 2022-11-12 ENCOUNTER — Ambulatory Visit: Payer: Medicaid Other | Attending: Internal Medicine | Admitting: Internal Medicine

## 2022-11-12 VITALS — BP 132/68 | HR 61 | Temp 98.2°F | Ht <= 58 in | Wt 100.0 lb

## 2022-11-12 DIAGNOSIS — G43829 Menstrual migraine, not intractable, without status migrainosus: Secondary | ICD-10-CM | POA: Diagnosis not present

## 2022-11-12 DIAGNOSIS — N946 Dysmenorrhea, unspecified: Secondary | ICD-10-CM

## 2022-11-12 MED ORDER — IBUPROFEN 600 MG PO TABS: 600.0000 mg | ORAL_TABLET | Freq: Three times a day (TID) | ORAL | 0 refills | Status: DC | PRN

## 2022-11-12 NOTE — Progress Notes (Signed)
Patient ID: Bonnie Conrad, female    DOB: March 29, 1971  MRN: [id number]  CC: Annual Exam (Physical. Roxy Cedar, pain on L side of abdomen, headache / dizziness X1 week/Yes to pap for another appt. Yes to mammogram referral. No to shingles vax. )   Subjective: Bonnie Conrad is a 52 y.o. female who presents for chronic ds management Her concerns today include:  Patient with history of GERD, MDD, IDA,    [employer] Language interpreter used during this encounter. #[id number], Janna Arch  Last visit 09/24/22:  Pt reports head injury 4 days ago at work causing HA and scalp pain.  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  Hit LT side of forehead and frontal scalp against the bar which knocked her backwards but did not fall. No LOC.  Sustained a small laceration which caused a lot of bleeding.  She was not seen in the emergency room. Did informed employee about it.  He placed ice on it and gave her some ASA. Still having pain over LT forehead that radiates over LT side of head and into the neck. Endorses LT sided headache and dizziness when she stands up Endorses little blurred vision.  No N/V.  She has been taking ibuprofen 600 mg.  She takes about 1 a day.  She is off for the next 4 days.  Patient was diagnosed with contusion of the head and neck region and posttraumatic headache.  CT scan of the head was negative.  Placed on ibuprofen and Flexeril. HA went away  Today she woke up with HA in RT temple, plusating and goes to back of neck. Did not take any thing for it.  Had to drive to go to work. Some nausea x 1 wk Little blurred vision.  +photophobia  Pain in abdomen since yesterday at start of menses; more so on LT side Similar to what she always gets. + cramps No dysuria.  She has not had any fever.  No vomiting.  Moving her bowels okay.  No blood in the stools. Did not take anything for pain.     Patient Active Problem List    Diagnosis Date Noted   Hiatal hernia 08/15/2018   History of hernia repair 08/15/2018   Pain at surgical site 08/15/2018   Iron deficiency anemia 06/02/2018   Dysphagia 06/02/2018   History of colonic polyps 06/02/2018   Ventral hernia without obstruction or gangrene 06/02/2018   Abnormal CT of the abdomen 03/09/2018   Non-intractable vomiting 03/09/2018   Chronic generalized abdominal pain 03/09/2018   Gastroesophageal reflux disease 03/09/2018   Rectal bleeding 03/09/2018   Hemorrhoids 03/09/2018   Major depressive disorder, recurrent episode, severe, with psychotic behavior (HCC) 04/03/2011   Severe major depression with psychotic features (HCC) 03/06/2011   Hernia of abdominal cavity 03/06/2011     Current Outpatient Medications on File Prior to Visit  Medication Sig Dispense Refill   ibuprofen (ADVIL) 600 MG tablet Take 1 tablet (600 mg total) by mouth 2 (two) times daily as needed for headache. Take with food 30 tablet 0   omeprazole (PRILOSEC) 20 MG capsule Take 1 capsule (20 mg total) by mouth daily. 30 capsule 3   cyclobenzaprine (FLEXERIL) 5 MG tablet Take 1 tablet (5 mg total) by mouth daily as needed for muscle spasms. (Patient not taking: Reported on 11/12/2022) 30 tablet 0   No current facility-administered medications on file prior to visit.    Allergies  Allergen Reactions   Penicillins Swelling    Did it involve swelling of the face/tongue/throat, SOB, or low BP? Yes Did it involve sudden or severe rash/hives, skin peeling, or any reaction on the inside of your mouth or nose? No Did you need to seek medical attention at a hospital or doctor's office? No When did it last happen?  1 or 2 years ago     If all above answers are "NO", may proceed with cephalosporin use.    Vicodin [Hydrocodone-Acetaminophen] Hives and Itching   Morphine And Codeine Rash    Nervous,itching    Social History   Socioeconomic History   Marital status: Married    Spouse name: Not  on file   Number of children: Not on file   Years of education: Not on file   Highest education level: Not on file  Occupational History   Occupation: part time housekeeper   Tobacco Use   Smoking status: Some Days    Types: Cigarettes   Smokeless tobacco: Never  Vaping Use   Vaping status: Never Used  Substance and Sexual Activity   Alcohol use: No   Drug use: No   Sexual activity: Yes    Birth control/protection: None    Comment: tubal  Other Topics Concern   Not on file  Social History Narrative   Not on file   Social Determinants of Health   Financial Resource Strain: Not on file  Food Insecurity: No Food Insecurity (12/17/2019)   Hunger Vital Sign    Worried About Running Out of Food in the Last Year: Never true    Ran Out of Food in the Last Year: Never true  Transportation Needs: No Transportation Needs (12/17/2019)   PRAPARE - Administrator, Civil Service (Medical): No    Lack of Transportation (Non-Medical): No  Physical Activity: Not on file  Stress: Not on file  Social Connections: Not on file  Intimate Partner Violence: Not on file    Family History  Problem Relation Age of Onset   Hypertension Mother    Anesthesia problems Neg Hx    Hypotension Neg Hx    Malignant hyperthermia Neg Hx    Pseudochol deficiency Neg Hx    Colon cancer Neg Hx    Esophageal cancer Neg Hx    Inflammatory bowel disease Neg Hx    Liver disease Neg Hx    Pancreatic cancer Neg Hx    Rectal cancer Neg Hx    Stomach cancer Neg Hx    Colon polyps Neg Hx     Past Surgical History:  Procedure Laterality Date   COLONOSCOPY WITH ESOPHAGOGASTRODUODENOSCOPY (EGD)     DILATION AND CURETTAGE OF UTERUS     EPIGASTRIC HERNIA REPAIR N/A 06/05/2018   Procedure: OPEN REPAIR OF INCARCERATED EPIGASTRIC HERNIA WITH MESH;  Surgeon: Berna Bue, MD;  Location: [facility];  Service: General;  Laterality: N/A;   ESOPHAGEAL MANOMETRY N/A 06/29/2018   Procedure: ESOPHAGEAL MANOMETRY  (EM);  Surgeon: Napoleon Form, MD;  Location: [facility];  Service: Endoscopy;  Laterality: N/A;   TUBAL LIGATION  07/25/2011   Procedure: POST PARTUM TUBAL LIGATION;  Surgeon: Catalina Antigua, MD;  Location: [facility];  Service: Gynecology;  Laterality: Bilateral;    ROS: Review of Systems Negative except as stated above  PHYSICAL EXAM: BP 132/68 (BP Location: Left Arm, Patient Position: Sitting, Cuff Size: Normal)   Pulse 61   Temp 98.2 F (36.8 C) (Oral)   Ht  4\' 9"  (1.448 m)   Wt 100 lb (45.4 kg)   LMP 11/11/2022 (Exact Date)   SpO2 98%   BMI 21.64 kg/m   Physical Exam  General appearance -patient was laying in the room in the dark when I entered the room.  Alert, well appearing, and in no distress Mental status - normal mood, behavior, speech, dress, motor activity, and thought processes Chest - clear to auscultation, no wheezes, rales or rhonchi, symmetric air entry Heart - normal rate, regular rhythm, normal S1, S2, no murmurs, rubs, clicks or gallops Abdomen - soft, nontender, nondistended, no masses or organomegaly Neurological - cranial nerves II through XII intact, motor and sensory grossly normal bilaterally.  Full sensation intact.  Gait is normal.      Latest Ref Rng & Units 05/17/2021   11:37 AM 10/29/2019    3:23 PM 04/14/2019    2:55 PM  CMP  Glucose 70 - 99 mg/dL 80  79  99   BUN 6 - 24 mg/dL 8  10  14    Creatinine 0.57 - 1.00 mg/dL 1.61  0.96  0.45   Sodium 134 - 144 mmol/L 140  139  137   Potassium 3.5 - 5.2 mmol/L 4.0  3.4  3.6   Chloride 96 - 106 mmol/L 102  108  105   CO2 20 - 29 mmol/L 22  21  23    Calcium 8.7 - 10.2 mg/dL 9.7  8.9  9.2   Total Protein 6.0 - 8.5 g/dL 7.5  6.8  7.2   Total Bilirubin 0.0 - 1.2 mg/dL 0.6  0.2  1.0   Alkaline Phos 44 - 121 IU/L 72  51  53   AST 0 - 40 IU/L 17  16  20    ALT 0 - 32 IU/L 14  13  18     Lipid Panel  No results found for: "CHOL", "TRIG", "HDL", "CHOLHDL", "VLDL", "LDLCALC", "LDLDIRECT"  CBC     Component Value Date/Time   WBC 7.6 05/17/2021 1137   WBC 8.7 10/29/2019 1523   RBC 4.34 05/17/2021 1137   RBC 4.05 10/29/2019 1523   HGB 13.0 05/17/2021 1137   HCT 40.2 05/17/2021 1137   PLT 261 05/17/2021 1137   MCV 93 05/17/2021 1137   MCH 30.0 05/17/2021 1137   MCH 29.9 10/29/2019 1523   MCHC 32.3 05/17/2021 1137   MCHC 31.8 10/29/2019 1523   RDW 14.0 05/17/2021 1137   LYMPHSABS 2.6 05/17/2021 1137   MONOABS 0.6 06/03/2018 1417   EOSABS 0.1 05/17/2021 1137   BASOSABS 0.0 05/17/2021 1137    ASSESSMENT AND PLAN: 1. Dysmenorrhea - ibuprofen (ADVIL) 600 MG tablet; Take 1 tablet (600 mg total) by mouth every 8 (eight) hours as needed for headache. Take with food  Dispense: 60 tablet; Refill: 0  2. Menstrual migraine without status migrainosus, not intractable -Refill given on ibuprofen to use as needed.  Follow-up with me if no improvement or any worsening. - ibuprofen (ADVIL) 600 MG tablet; Take 1 tablet (600 mg total) by mouth every 8 (eight) hours as needed for headache. Take with food  Dispense: 60 tablet; Refill: 0    Patient was given the opportunity to ask questions.  Patient verbalized understanding of the plan and was able to repeat key elements of the plan.   This documentation was completed using Paediatric nurse.  Any transcriptional errors are unintentional.  No orders of the defined types were placed in this encounter.    Requested  Prescriptions    No prescriptions requested or ordered in this encounter    No follow-ups on file.  Jonah Blue, MD, FACP

## 2023-02-18 ENCOUNTER — Ambulatory Visit: Payer: Medicaid Other | Attending: Internal Medicine | Admitting: Internal Medicine

## 2023-02-18 ENCOUNTER — Ambulatory Visit
Admission: RE | Admit: 2023-02-18 | Discharge: 2023-02-18 | Disposition: A | Discharge status: Home / Self Care | Payer: Medicaid Other | Source: Ambulatory Visit | Attending: Internal Medicine | Admitting: Internal Medicine

## 2023-02-18 ENCOUNTER — Encounter: Payer: Self-pay | Admitting: Internal Medicine

## 2023-02-18 VITALS — BP 118/62 | HR 65 | Temp 98.2°F | Ht <= 58 in | Wt 102.0 lb

## 2023-02-18 DIAGNOSIS — M25522 Pain in left elbow: Secondary | ICD-10-CM

## 2023-02-18 DIAGNOSIS — Z23 Encounter for immunization: Secondary | ICD-10-CM | POA: Diagnosis not present

## 2023-02-18 DIAGNOSIS — R200 Anesthesia of skin: Secondary | ICD-10-CM | POA: Diagnosis not present

## 2023-02-18 DIAGNOSIS — F17200 Nicotine dependence, unspecified, uncomplicated: Secondary | ICD-10-CM | POA: Diagnosis not present

## 2023-02-18 DIAGNOSIS — R202 Paresthesia of skin: Secondary | ICD-10-CM | POA: Diagnosis not present

## 2023-02-18 DIAGNOSIS — G5603 Carpal tunnel syndrome, bilateral upper limbs: Secondary | ICD-10-CM

## 2023-02-18 DIAGNOSIS — Z1211 Encounter for screening for malignant neoplasm of colon: Secondary | ICD-10-CM

## 2023-02-18 DIAGNOSIS — Z1231 Encounter for screening mammogram for malignant neoplasm of breast: Secondary | ICD-10-CM | POA: Diagnosis not present

## 2023-02-18 MED ORDER — NICOTINE POLACRILEX 2 MG MT GUM: 2.0000 mg | CHEWING_GUM | OROMUCOSAL | 0 refills | Status: DC | PRN

## 2023-02-18 MED ORDER — IBUPROFEN 600 MG PO TABS: 600.0000 mg | ORAL_TABLET | Freq: Three times a day (TID) | ORAL | 0 refills | Status: DC | PRN

## 2023-02-18 NOTE — Patient Instructions (Signed)
Sndrome del tnel carpiano Carpal Tunnel Syndrome  El sndrome del tnel carpiano es una afeccin que causa dolor, debilidad y adormecimiento en la mano y los dedos. El adormecimiento es cuando no siente una zona del cuerpo. El tnel carpiano es un rea estrecha que se encuentra en el lado palmar de la mueca. Los movimientos repetidos de la mueca o determinadas enfermedades pueden causar hinchazn en el tnel. Esta hinchazn puede comprimir el nervio principal de la mueca. Este nervio se llama "nervio mediano". Cules son las causas? Esta afeccin puede ser causada por lo siguiente: Mover la mano y la mueca una y otra vez mientras realiza una tarea. Lesin en la mueca. Artritis. Un saco lleno de lquido (quiste) o un crecimiento anormal (tumor) en el tnel carpiano. Acumulacin de lquido durante el embarazo. Uso de herramientas que vibran. Algunas veces, la causa no se conoce. Qu incrementa el riesgo? Los siguientes factores pueden hacer que sea ms propenso a tener esta afeccin: Tener un trabajo en el que deba hacer estas cosas: Mover la mano una y otra vez. Trabajar con herramientas que vibran, como taladros o lijadoras. Ser mujer. Tener diabetes, obesidad, problemas de tiroides o insuficiencia renal. Cules son los signos o sntomas? Los sntomas de esta afeccin incluyen: Sensacin de hormigueo en los dedos. Hormigueo o prdida de la sensibilidad de la mano. Dolor en todo el brazo. Este dolor puede empeorar al flexionar la mueca y el codo durante mucho tiempo. Dolor en la mueca que sube por el brazo hasta el hombro. Dolor que baja hasta la palma de la mano o los dedos. Debilidad en las manos. Puede resultarle difcil tomar y sostener objetos. Es posible que se sienta peor por la noche. Cmo se trata? El tratamiento de esta afeccin puede incluir: Cambios en el estilo de vida. Se le pedir que deje o cambie la actividad que caus el problema. Hacer ejercicios y  actividades para que los huesos, los msculos y los tendones se vuelvan ms fuertes (fisioterapia). Aprender a usar la mano nuevamente (terapia ocupacional). Medicamentos para el dolor y la hinchazn. Es posible que le apliquen inyecciones en la mueca. Una frula o un dispositivo ortopdico para la mueca. Ciruga. Siga estas instrucciones en su casa: Si tiene una frula o un dispositivo ortopdico: Use la frula o el dispositivo ortopdico como se lo haya indicado el mdico. Quteselos solamente como se lo haya indicado el mdico. Afloje la frula si los dedos: Hormiguean. Se adormecen. Se tornan fros y de color azul. Mantenga la frula o el dispositivo ortopdico limpios. Si la frula o el dispositivo ortopdico no son impermeables: No deje que se mojen. Cbralos con un envoltorio hermtico cuando tome un bao de inmersin o una ducha. Control del dolor, la rigidez y la hinchazn Si se lo indican, aplique hielo sobre la zona dolorida: Si tiene un dispositivo ortopdico o una frula desmontable, quteselos como se lo haya indicado el mdico. Ponga el hielo en una bolsa plstica. Coloque una toalla entre la piel y la bolsa. Coloque el hielo durante 20 minutos, 2 a 3 veces al da. No se quede dormido con la bolsa de hielo sobre la piel. Retire el hielo si la piel se le pone de color rojo brillante. Esto es muy importante. Si no puede sentir dolor, calor o fro, tiene un mayor riesgo de que se dae la zona. Mueva los dedos con frecuencia para reducir la rigidez y la hinchazn. Instrucciones generales Tome los medicamentos de venta libre y los recetados solamente   como se lo haya indicado el mdico. Descanse la mueca de cualquier actividad que le cause dolor. Si es necesario, hable con su jefe en el trabajo sobre los cambios que pueden ayudar a la curacin de la mueca. Haga los ejercicios que le hayan indicado el mdico, el fisioterapeuta o el terapeuta ocupacional. Cumpla con todas las  visitas de seguimiento. Comunquese con un mdico si: Aparecen nuevos sntomas. Los medicamentos no le alivian el dolor. Sus sntomas empeoran. Solicite ayuda de inmediato si: Tiene adormecimiento u hormigueo muy intensos en la mueca o la mano. Resumen El sndrome del tnel carpiano es una afeccin que causa dolor en la mano y en el brazo. Suele deberse a movimientos repetidos de la mueca. Este problema se trata mediante cambios en el estilo de vida y medicamentos. La ciruga puede ser necesaria en casos muy graves. Siga las instrucciones del mdico sobre el uso de una frula, el reposo de la mueca, la asistencia a las consultas de seguimiento y llamar para pedir ayuda. Esta informacin no tiene como fin reemplazar el consejo del mdico. Asegrese de hacerle al mdico cualquier pregunta que tenga. Document Revised: 09/17/2019 Document Reviewed: 09/17/2019 Elsevier Patient Education  2024 [employer] Inc.  

## 2023-02-18 NOTE — Progress Notes (Signed)
Patient ID: Bonnie Conrad, female    DOB: [birth year]  MRN: [id number]  CC: Follow-up (Follow-up. /Tingling of fingers, pain on L elbow/Yes to flu vax. Yes to mammogram referral & colonoscopy)   Subjective: Bonnie Conrad is a 52 y.o. female who presents for chronic ds management. Her concerns today include:  Patient with history of GERD, MDD, IDA, menstrual migraines  [employer] Language interpreter used during this encounter. #[id number], Fatima  C/o tingling/numbness in fingers both hands x 2 mths. Numbness that wakes her up at nights.  Can last 10-15 mins.  No pain in hands or wrist. 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  Also c/o pain LT elbow x 2 wks; started after she accidentally hit herself with a metal at work.  Pain is over medial aspect and she points to medial epicondyl  PHQ9 completed.  Reports no major issues with depression.  Tob dep:  smokes 3 cigarettes a day.  Trying to quit.   Patient Active Problem List   Diagnosis Date Noted   Hiatal hernia 08/15/2018   History of hernia repair 08/15/2018   Pain at surgical site 08/15/2018   Iron deficiency anemia 06/02/2018   Dysphagia 06/02/2018   History of colonic polyps 06/02/2018   Ventral hernia without obstruction or gangrene 06/02/2018   Abnormal CT of the abdomen 03/09/2018   Non-intractable vomiting 03/09/2018   Chronic generalized abdominal pain 03/09/2018   Gastroesophageal reflux disease 03/09/2018   Rectal bleeding 03/09/2018   Hemorrhoids 03/09/2018   Major depressive disorder, recurrent episode, severe, with psychotic behavior (HCC) 04/03/2011   Severe major depression with psychotic features (HCC) 03/06/2011   Hernia of abdominal cavity 03/06/2011     Current Outpatient Medications on File Prior to Visit  Medication Sig Dispense Refill   cyclobenzaprine (FLEXERIL) 5 MG tablet Take 1 tablet (5 mg total) by mouth daily as needed for muscle spasms. (Patient not taking:  Reported on 11/12/2022) 30 tablet 0   ibuprofen (ADVIL) 600 MG tablet Take 1 tablet (600 mg total) by mouth every 8 (eight) hours as needed for headache. Take with food (Patient not taking: Reported on 02/18/2023) 60 tablet 0   omeprazole (PRILOSEC) 20 MG capsule Take 1 capsule (20 mg total) by mouth daily. (Patient not taking: Reported on 02/18/2023) 30 capsule 3   No current facility-administered medications on file prior to visit.    Allergies  Allergen Reactions   Penicillins Swelling    Did it involve swelling of the face/tongue/throat, SOB, or low BP? Yes Did it involve sudden or severe rash/hives, skin peeling, or any reaction on the inside of your mouth or nose? No Did you need to seek medical attention at a hospital or doctor's office? No When did it last happen?  1 or 2 years ago     If all above answers are "NO", may proceed with cephalosporin use.    Vicodin [Hydrocodone-Acetaminophen] Hives and Itching   Morphine And Codeine Rash    Nervous,itching    Social History   Socioeconomic History   Marital status: Married    Spouse name: Not on file   Number of children: Not on file   Years of education: Not on file   Highest education level: Not on file  Occupational History   Occupation: part time housekeeper   Tobacco Use   Smoking status: Some Days    Types: Cigarettes   Smokeless tobacco: Never  Vaping Use   Vaping status: Never Used  Substance and Sexual Activity   Alcohol use: No   Drug use: No   Sexual activity: Yes    Birth control/protection: None    Comment: tubal  Other Topics Concern   Not on file  Social History Narrative   Not on file   Social Determinants of Health   Financial Resource Strain: Low Risk  (02/18/2023)   Overall Financial Resource Strain (CARDIA)    Difficulty of Paying Living Expenses: Not hard at all  Food Insecurity: No Food Insecurity (02/18/2023)   Hunger Vital Sign    Worried About Running Out of Food in the Last Year: Never  true    Ran Out of Food in the Last Year: Never true  Transportation Needs: No Transportation Needs (02/18/2023)   PRAPARE - Administrator, Civil Service (Medical): No    Lack of Transportation (Non-Medical): No  Physical Activity: Inactive (02/18/2023)   Exercise Vital Sign    Days of Exercise per Week: 0 days    Minutes of Exercise per Session: 0 min  Stress: No Stress Concern Present (02/18/2023)   [employer] of Occupational Health - Occupational Stress Questionnaire    Feeling of Stress : Not at all  Social Connections: Socially Isolated (02/18/2023)   Social Connection and Isolation Panel [NHANES]    Frequency of Communication with Friends and Family: More than three times a week    Frequency of Social Gatherings with Friends and Family: Once a week    Attends Religious Services: Never    Database administrator or Organizations: No    Attends Banker Meetings: Never    Marital Status: Separated  Intimate Partner Violence: Not At Risk (02/18/2023)   Humiliation, Afraid, Rape, and Kick questionnaire    Fear of Current or Ex-Partner: No    Emotionally Abused: No    Physically Abused: No    Sexually Abused: No    Family History  Problem Relation Age of Onset   Hypertension Mother    Anesthesia problems Neg Hx    Hypotension Neg Hx    Malignant hyperthermia Neg Hx    Pseudochol deficiency Neg Hx    Colon cancer Neg Hx    Esophageal cancer Neg Hx    Inflammatory bowel disease Neg Hx    Liver disease Neg Hx    Pancreatic cancer Neg Hx    Rectal cancer Neg Hx    Stomach cancer Neg Hx    Colon polyps Neg Hx     Past Surgical History:  Procedure Laterality Date   COLONOSCOPY WITH ESOPHAGOGASTRODUODENOSCOPY (EGD)     DILATION AND CURETTAGE OF UTERUS     EPIGASTRIC HERNIA REPAIR N/A 06/05/2018   Procedure: OPEN REPAIR OF INCARCERATED EPIGASTRIC HERNIA WITH MESH;  Surgeon: Berna Bue, MD;  Location: [facility];  Service: General;  Laterality:  N/A;   ESOPHAGEAL MANOMETRY N/A 06/29/2018   Procedure: ESOPHAGEAL MANOMETRY (EM);  Surgeon: Napoleon Form, MD;  Location: [facility];  Service: Endoscopy;  Laterality: N/A;   TUBAL LIGATION  07/25/2011   Procedure: POST PARTUM TUBAL LIGATION;  Surgeon: Catalina Antigua, MD;  Location: [facility];  Service: Gynecology;  Laterality: Bilateral;    ROS: Review of Systems Negative except as stated above  PHYSICAL EXAM: BP 118/62 (BP Location: Left Arm, Patient Position: Sitting, Cuff Size: Normal)   Pulse 65   Temp 98.2 F (36.8 C) (Oral)   Ht 4\' 9"  (1.448 m)   Wt 102 lb (46.3 kg)  SpO2 100%   BMI 22.07 kg/m   Physical Exam  General appearance - alert, well appearing, and in no distress Mental status - normal mood, behavior, speech, dress, motor activity, and thought processes Musculoskeletal - LT elbow:  mild edema over the medial epicondyles.  Area is exquisitely tender to touch.  Mild to moderate discomfort with attempted passive range of motion of the elbow. Hands: Grip 5/5 bilaterally.  No wasting of intrinsic muscles of the hands Extremities -radial and brachial pulses are 3+ bilaterally.  No cyanosis noted of the fingers.     02/18/2023    9:33 AM 11/12/2022   10:58 AM 09/24/2022    9:42 AM  Depression screen PHQ 2/9  Decreased Interest 0 1 0  Down, Depressed, Hopeless 0 0 1  PHQ - 2 Score 0 1 1  Altered sleeping 0 3 1  Tired, decreased energy 3 3 1   Change in appetite 0 3 1  Feeling bad or failure about yourself  0 0 0  Trouble concentrating 0 0 0  Moving slowly or fidgety/restless 1 0 0  Suicidal thoughts 0 0 0  PHQ-9 Score 4 10 4   Difficult doing work/chores Not difficult at all        02/18/2023    9:33 AM 11/12/2022   10:58 AM 09/24/2022    9:43 AM 08/14/2021   10:33 AM  GAD 7 : Generalized Anxiety Score  Nervous, Anxious, on Edge 1 0 1 0  Control/stop worrying 3 3 0 0  Worry too much - different things 3 3 1  0  Trouble relaxing 1 0 1 0  Restless 0 0 0 0   Easily annoyed or irritable 0 0 0 0  Afraid - awful might happen 0 0 1 0  Total GAD 7 Score 8 6 4  0  Anxiety Difficulty Not difficult at all           Latest Ref Rng & Units 05/17/2021   11:37 AM 10/29/2019    3:23 PM 04/14/2019    2:55 PM  CMP  Glucose 70 - 99 mg/dL 80  79  99   BUN 6 - 24 mg/dL 8  10  14    Creatinine 0.57 - 1.00 mg/dL 4.69  6.29  5.28   Sodium 134 - 144 mmol/L 140  139  137   Potassium 3.5 - 5.2 mmol/L 4.0  3.4  3.6   Chloride 96 - 106 mmol/L 102  108  105   CO2 20 - 29 mmol/L 22  21  23    Calcium 8.7 - 10.2 mg/dL 9.7  8.9  9.2   Total Protein 6.0 - 8.5 g/dL 7.5  6.8  7.2   Total Bilirubin 0.0 - 1.2 mg/dL 0.6  0.2  1.0   Alkaline Phos 44 - 121 IU/L 72  51  53   AST 0 - 40 IU/L 17  16  20    ALT 0 - 32 IU/L 14  13  18     Lipid Panel  No results found for: "CHOL", "TRIG", "HDL", "CHOLHDL", "VLDL", "LDLCALC", "LDLDIRECT"  CBC    Component Value Date/Time   WBC 7.6 05/17/2021 1137   WBC 8.7 10/29/2019 1523   RBC 4.34 05/17/2021 1137   RBC 4.05 10/29/2019 1523   HGB 13.0 05/17/2021 1137   HCT 40.2 05/17/2021 1137   PLT 261 05/17/2021 1137   MCV 93 05/17/2021 1137   MCH 30.0 05/17/2021 1137   MCH 29.9 10/29/2019 1523   MCHC 32.3  05/17/2021 1137   MCHC 31.8 10/29/2019 1523   RDW 14.0 05/17/2021 1137   LYMPHSABS 2.6 05/17/2021 1137   MONOABS 0.6 06/03/2018 1417   EOSABS 0.1 05/17/2021 1137   BASOSABS 0.0 05/17/2021 1137    ASSESSMENT AND PLAN: 1. Numbness and tingling in both hands Most consistent with carpal tunnel syndrome.  Discussed this diagnosis with her and management.  Will start with having her wear cock up wrist splints at night.  Prescription given for her to take to a medical supply store to get a pair of cock-up wrist splints. - Vitamin B12  2. Bilateral carpal tunnel syndrome See #1 above - For home use only DME Other see comment  3. Tobacco dependence Pt is current smoker. Patient advised to quit smoking. Discussed health risks  associated with smoking including lung and other types of cancers, chronic lung diseases and CV risks.. Pt ready to give trail of quitting.   Discussed methods to help quit including quitting cold [place], use of NRT, Chantix and Bupropion.  Pt wanting to try: Nicotine gum. _3_ Minutes spent on counseling. F/U: Will assess progress on subsequent visit.  - nicotine polacrilex (NICORETTE) 2 MG gum; Take 1 each (2 mg total) by mouth as needed for smoking cessation.  Dispense: 100 tablet; Refill: 0  4. Left elbow pain Contusion versus fracture.  X-ray ordered.  Recommend warm compresses. - ibuprofen (ADVIL) 600 MG tablet; Take 1 tablet (600 mg total) by mouth every 8 (eight) hours as needed for moderate pain (pain score 4-6). Take with food  Dispense: 60 tablet; Refill: 0 - DG Elbow Complete Left; Future  5. Need for influenza vaccination Given today.  6. Encounter for screening mammogram for malignant neoplasm of breast - MM Digital Screening; Future  7. Need for shingles vaccine We are out of the vaccine.  Will plan to give on subsequent visit  8. Screening for colon cancer Due for repeat colonoscopy.  History of colon polyps. - Ambulatory referral to Gastroenterology    Patient was given the opportunity to ask questions.  Patient verbalized understanding of the plan and was able to repeat key elements of the plan.   This documentation was completed using Paediatric nurse.  Any transcriptional errors are unintentional.  No orders of the defined types were placed in this encounter.    Requested Prescriptions    No prescriptions requested or ordered in this encounter    No follow-ups on file.  Jonah Blue, MD, FACP

## 2023-03-03 ENCOUNTER — Other Ambulatory Visit: Payer: Self-pay | Admitting: Internal Medicine

## 2023-03-03 DIAGNOSIS — Z1231 Encounter for screening mammogram for malignant neoplasm of breast: Secondary | ICD-10-CM

## 2023-03-11 ENCOUNTER — Telehealth: Payer: Self-pay

## 2023-03-11 NOTE — Telephone Encounter (Signed)
-----   Message from [facility] Melvia Matousek M sent at 03/10/2023  8:40 AM EST -----  ----- Message ----- From: Marcine Matar, MD Sent: 03/10/2023   7:30 AM EST To: Johna Roles, CMA  Let patient know that the x-ray of her left elbow was negative for fracture.

## 2023-03-11 NOTE — Telephone Encounter (Signed)
Pt was called and is aware of results, DOB was confirmed.   Interpreter id # [phone]

## 2023-03-25 ENCOUNTER — Ambulatory Visit
Admission: RE | Admit: 2023-03-25 | Discharge: 2023-03-25 | Disposition: A | Discharge status: Home / Self Care | Payer: Medicaid Other | Source: Ambulatory Visit | Attending: Internal Medicine | Admitting: Internal Medicine

## 2023-03-25 DIAGNOSIS — Z1231 Encounter for screening mammogram for malignant neoplasm of breast: Secondary | ICD-10-CM

## 2023-03-26 DIAGNOSIS — Z1231 Encounter for screening mammogram for malignant neoplasm of breast: Secondary | ICD-10-CM

## 2023-03-28 ENCOUNTER — Other Ambulatory Visit: Payer: Self-pay | Admitting: Internal Medicine

## 2023-03-28 ENCOUNTER — Encounter: Payer: Self-pay | Admitting: Internal Medicine

## 2023-03-28 DIAGNOSIS — R928 Other abnormal and inconclusive findings on diagnostic imaging of breast: Secondary | ICD-10-CM

## 2023-04-21 ENCOUNTER — Other Ambulatory Visit: Payer: Medicaid Other

## 2023-04-29 ENCOUNTER — Ambulatory Visit
Admission: RE | Admit: 2023-04-29 | Discharge: 2023-04-29 | Disposition: A | Discharge status: Home / Self Care | Payer: Medicaid Other | Source: Ambulatory Visit | Attending: Internal Medicine | Admitting: Internal Medicine

## 2023-04-29 ENCOUNTER — Other Ambulatory Visit: Payer: Self-pay | Admitting: Internal Medicine

## 2023-04-29 DIAGNOSIS — R928 Other abnormal and inconclusive findings on diagnostic imaging of breast: Secondary | ICD-10-CM

## 2023-04-29 DIAGNOSIS — N6489 Other specified disorders of breast: Secondary | ICD-10-CM | POA: Diagnosis not present

## 2023-04-29 DIAGNOSIS — R922 Inconclusive mammogram: Secondary | ICD-10-CM | POA: Diagnosis not present

## 2023-04-29 DIAGNOSIS — R921 Mammographic calcification found on diagnostic imaging of breast: Secondary | ICD-10-CM | POA: Diagnosis not present

## 2023-05-01 ENCOUNTER — Ambulatory Visit
Admission: RE | Admit: 2023-05-01 | Discharge: 2023-05-01 | Disposition: A | Discharge status: Home / Self Care | Payer: Medicaid Other | Source: Ambulatory Visit | Attending: Internal Medicine | Admitting: Internal Medicine

## 2023-05-01 DIAGNOSIS — N6321 Unspecified lump in the left breast, upper outer quadrant: Secondary | ICD-10-CM | POA: Diagnosis not present

## 2023-05-01 DIAGNOSIS — N6032 Fibrosclerosis of left breast: Secondary | ICD-10-CM | POA: Diagnosis not present

## 2023-05-01 DIAGNOSIS — N6489 Other specified disorders of breast: Secondary | ICD-10-CM

## 2023-05-01 DIAGNOSIS — N6342 Unspecified lump in left breast, subareolar: Secondary | ICD-10-CM | POA: Diagnosis not present

## 2023-05-01 DIAGNOSIS — N6011 Diffuse cystic mastopathy of right breast: Secondary | ICD-10-CM | POA: Diagnosis not present

## 2023-05-01 DIAGNOSIS — N6341 Unspecified lump in right breast, subareolar: Secondary | ICD-10-CM | POA: Diagnosis not present

## 2023-05-01 DIAGNOSIS — R921 Mammographic calcification found on diagnostic imaging of breast: Secondary | ICD-10-CM

## 2023-05-01 HISTORY — PX: BREAST BIOPSY: SHX20

## 2023-05-02 LAB — SURGICAL PATHOLOGY

## 2023-05-03 ENCOUNTER — Encounter: Payer: Self-pay | Admitting: Internal Medicine

## 2023-05-03 DIAGNOSIS — N6019 Diffuse cystic mastopathy of unspecified breast: Secondary | ICD-10-CM | POA: Insufficient documentation

## 2023-05-06 ENCOUNTER — Other Ambulatory Visit: Payer: Medicaid Other

## 2023-08-18 ENCOUNTER — Encounter: Payer: Self-pay | Admitting: Internal Medicine

## 2023-08-18 ENCOUNTER — Ambulatory Visit: Payer: Medicaid Other | Attending: Internal Medicine | Admitting: Internal Medicine

## 2023-08-18 VITALS — BP 139/70 | HR 66 | Temp 97.8°F | Ht <= 58 in | Wt 106.0 lb

## 2023-08-18 DIAGNOSIS — N951 Menopausal and female climacteric states: Secondary | ICD-10-CM | POA: Diagnosis not present

## 2023-08-18 DIAGNOSIS — M25511 Pain in right shoulder: Secondary | ICD-10-CM | POA: Diagnosis not present

## 2023-08-18 DIAGNOSIS — R202 Paresthesia of skin: Secondary | ICD-10-CM | POA: Insufficient documentation

## 2023-08-18 DIAGNOSIS — R030 Elevated blood-pressure reading, without diagnosis of hypertension: Secondary | ICD-10-CM

## 2023-08-18 DIAGNOSIS — G44311 Acute post-traumatic headache, intractable: Secondary | ICD-10-CM | POA: Insufficient documentation

## 2023-08-18 DIAGNOSIS — G8929 Other chronic pain: Secondary | ICD-10-CM

## 2023-08-18 DIAGNOSIS — F17210 Nicotine dependence, cigarettes, uncomplicated: Secondary | ICD-10-CM | POA: Diagnosis not present

## 2023-08-18 DIAGNOSIS — Z760 Encounter for issue of repeat prescription: Secondary | ICD-10-CM | POA: Diagnosis not present

## 2023-08-18 DIAGNOSIS — Z79899 Other long term (current) drug therapy: Secondary | ICD-10-CM | POA: Insufficient documentation

## 2023-08-18 DIAGNOSIS — M542 Cervicalgia: Secondary | ICD-10-CM | POA: Insufficient documentation

## 2023-08-18 DIAGNOSIS — Z23 Encounter for immunization: Secondary | ICD-10-CM

## 2023-08-18 DIAGNOSIS — R232 Flushing: Secondary | ICD-10-CM | POA: Insufficient documentation

## 2023-08-18 DIAGNOSIS — F17200 Nicotine dependence, unspecified, uncomplicated: Secondary | ICD-10-CM

## 2023-08-18 MED ORDER — NICOTINE POLACRILEX 2 MG MT GUM: 2.0000 mg | CHEWING_GUM | OROMUCOSAL | 0 refills | Status: AC | PRN

## 2023-08-18 MED ORDER — CYCLOBENZAPRINE HCL 5 MG PO TABS: 5.0000 mg | ORAL_TABLET | Freq: Every day | ORAL | 1 refills | Status: DC | PRN

## 2023-08-18 MED ORDER — OMEPRAZOLE 20 MG PO CPDR: 20.0000 mg | DELAYED_RELEASE_CAPSULE | Freq: Every day | ORAL | 3 refills | Status: DC

## 2023-08-18 MED ORDER — NICOTINE 14 MG/24HR TD PT24: 14.0000 mg | MEDICATED_PATCH | Freq: Every day | TRANSDERMAL | 1 refills | Status: AC

## 2023-08-18 MED ORDER — PAROXETINE HCL 10 MG PO TABS
10.0000 mg | ORAL_TABLET | Freq: Every day | ORAL | 1 refills | Status: DC
Start: 2023-08-18 — End: 2023-11-05

## 2023-08-18 NOTE — Progress Notes (Signed)
 Patient ID: Bonnie Conrad, female    DOB: [birth year]  MRN: [id number]  CC: Follow-up (Follow-up. Med refill. [facility] no menstrual cycle for 2 mo, mood swings / irritable, headaches, hot flashes X2 mo /Intermittent R shoulder radiating pain to R arm X3 mo/Yes to pneumonia vax)   Subjective: Bonnie Conrad is a 53 y.o. female who presents for chronic ds management. Her concerns today include:  Patient with history of tobacco dependence, migraines, MDD, CTS  AMN Language interpreter used during this encounter. #Manuel [id number]  Pt has not had menses x 2 mths; up to that point she was getting cycles every month.  Sexually active; Had tubal ligation 12 yrs ago. Also reports hot flashes, mood swings and irritability x 2 mths as well.  Flashes are frequent and worse at nights.   Had breast bx 04/2023 that was negative for cancer.  No fhx of breast cancer. She is a smoker and has hx migraines  C/o pain in RT shoulder x 3 mths. Pt constant and worse with movement.  Pain goes to neck and muscles over the shoulder and shoulder blade feeltight. Takes Ibuprofen  about three times a week with partial relief. Some tingling in fingers; dx with CTS on last visit.  Started after having an incident at work where mop slipped out of her hand and she accidentally hit elbow against a door.    Still smokes; about 4 cigarettes a day.  Smoked for 6 yrs.  Has quit in past for 2-3 mths in past but now can quit for only about 1 wk without help.  Ready to quit.    Needs RF on Omeprazole .  HM:  yes to PCV vaccine. Patient Active Problem List   Diagnosis Date Noted   Fibrocystic breast changes 05/03/2023   Hiatal hernia 08/15/2018   History of hernia repair 08/15/2018   Pain at surgical site 08/15/2018   Iron deficiency anemia 06/02/2018   Dysphagia 06/02/2018   History of colonic polyps 06/02/2018   Ventral hernia without obstruction or gangrene 06/02/2018   Abnormal CT of the abdomen  03/09/2018   Non-intractable vomiting 03/09/2018   Chronic generalized abdominal pain 03/09/2018   Gastroesophageal reflux disease 03/09/2018   Rectal bleeding 03/09/2018   Hemorrhoids 03/09/2018   Hernia of abdominal cavity 03/06/2011     Current Outpatient Medications on File Prior to Visit  Medication Sig Dispense Refill   ibuprofen  (ADVIL ) 600 MG tablet Take 1 tablet (600 mg total) by mouth every 8 (eight) hours as needed for moderate pain (pain score 4-6). Take with food 60 tablet 0   cyclobenzaprine  (FLEXERIL ) 5 MG tablet Take 1 tablet (5 mg total) by mouth daily as needed for muscle spasms. (Patient not taking: Reported on 08/18/2023) 30 tablet 0   nicotine  polacrilex (NICORETTE ) 2 MG gum Take 1 each (2 mg total) by mouth as needed for smoking cessation. (Patient not taking: Reported on 08/18/2023) 100 tablet 0   omeprazole  (PRILOSEC) 20 MG capsule Take 1 capsule (20 mg total) by mouth daily. (Patient not taking: Reported on 08/18/2023) 30 capsule 3   No current facility-administered medications on file prior to visit.    Allergies  Allergen Reactions   Penicillins Swelling    Did it involve swelling of the face/tongue/throat, SOB, or low BP? Yes Did it involve sudden or severe rash/hives, skin peeling, or any reaction on the inside of your mouth or nose? No Did you need to seek medical attention at a hospital or  doctor's office? No When did it last happen?  1 or 2 years ago     If all above answers are "NO", may proceed with cephalosporin use.    Vicodin [Hydrocodone -Acetaminophen ] Hives and Itching   Morphine  And Codeine Rash    Nervous,itching    Social History   Socioeconomic History   Marital status: Married    Spouse name: Not on file   Number of children: Not on file   Years of education: Not on file   Highest education level: Not on file  Occupational History   Occupation: part time housekeeper   Tobacco Use   Smoking status: Some Days    Types: Cigarettes    Smokeless tobacco: Never  Vaping Use   Vaping status: Never Used  Substance and Sexual Activity   Alcohol use: No   Drug use: No   Sexual activity: Yes    Birth control/protection: None    Comment: tubal  Other Topics Concern   Not on file  Social History Narrative   Not on file   Social Drivers of Health   Financial Resource Strain: Low Risk  (02/18/2023)   Overall Financial Resource Strain (CARDIA)    Difficulty of Paying Living Expenses: Not hard at all  Food Insecurity: No Food Insecurity (02/18/2023)   Hunger Vital Sign    Worried About Running Out of Food in the Last Year: Never true    Ran Out of Food in the Last Year: Never true  Transportation Needs: No Transportation Needs (02/18/2023)   PRAPARE - Administrator, Civil Service (Medical): No    Lack of Transportation (Non-Medical): No  Physical Activity: Inactive (02/18/2023)   Exercise Vital Sign    Days of Exercise per Week: 0 days    Minutes of Exercise per Session: 0 min  Stress: No Stress Concern Present (02/18/2023)   [employer] of Occupational Health - Occupational Stress Questionnaire    Feeling of Stress : Not at all  Social Connections: Socially Isolated (02/18/2023)   Social Connection and Isolation Panel [NHANES]    Frequency of Communication with Friends and Family: More than three times a week    Frequency of Social Gatherings with Friends and Family: Once a week    Attends Religious Services: Never    Database administrator or Organizations: No    Attends Banker Meetings: Never    Marital Status: Separated  Intimate Partner Violence: Not At Risk (02/18/2023)   Humiliation, Afraid, Rape, and Kick questionnaire    Fear of Current or Ex-Partner: No    Emotionally Abused: No    Physically Abused: No    Sexually Abused: No    Family History  Problem Relation Age of Onset   Hypertension Mother    Anesthesia problems Neg Hx    Hypotension Neg Hx    Malignant hyperthermia  Neg Hx    Pseudochol deficiency Neg Hx    Colon cancer Neg Hx    Esophageal cancer Neg Hx    Inflammatory bowel disease Neg Hx    Liver disease Neg Hx    Pancreatic cancer Neg Hx    Rectal cancer Neg Hx    Stomach cancer Neg Hx    Colon polyps Neg Hx    BRCA 1/2 Neg Hx    Breast cancer Neg Hx     Past Surgical History:  Procedure Laterality Date   BREAST BIOPSY Right 05/01/2023   MM RT BREAST BX W LOC DEV  1ST LESION IMAGE BX SPEC STEREO GUIDE 05/01/2023 [facility]   BREAST BIOPSY Left 05/01/2023   MM LT BREAST BX W LOC DEV 1ST LESION IMAGE BX SPEC STEREO GUIDE 05/01/2023 [facility]   COLONOSCOPY WITH ESOPHAGOGASTRODUODENOSCOPY (EGD)     DILATION AND CURETTAGE OF UTERUS     EPIGASTRIC HERNIA REPAIR N/A 06/05/2018   Procedure: OPEN REPAIR OF INCARCERATED EPIGASTRIC HERNIA WITH MESH;  Surgeon: Adalberto Acton, MD;  [facility];  Service: General;  Laterality: N/A;   ESOPHAGEAL MANOMETRY N/A 06/29/2018   Procedure: ESOPHAGEAL MANOMETRY (EM);  Surgeon: Sergio Dandy, MD;  Location: [facility];  Service: Endoscopy;  Laterality: N/A;   TUBAL LIGATION  07/25/2011   Procedure: POST PARTUM TUBAL LIGATION;  Surgeon: Verlyn Goad, MD;  Location: [facility];  Service: Gynecology;  Laterality: Bilateral;    ROS: Review of Systems Negative except as stated above  PHYSICAL EXAM: BP 138/81 (BP Location: Left Arm, Patient Position: Sitting, Cuff Size: Normal)   Pulse 66   Temp 97.8 F (36.6 C) (Oral)   Ht 4\' 9"  (1.448 m)   Wt 106 lb (48.1 kg)   SpO2 96%   BMI 22.94 kg/m   Physical Exam  General appearance - alert, well appearing, and in no distress Mental status - normal mood, behavior, speech, dress, motor activity, and thought processes Musculoskeletal -mild tenderness on palpation of the cervical spine.  She expresses discomfort with passive range of motion of the cervical spine in all directions. RT shoulder: Mild tenderness on palpation over the anterior joint  and over the deltoid muscle.  Discomfort with passive range of motion in all directions.  Drop arm test positive.      Latest Ref Rng & Units 05/17/2021   11:37 AM 10/29/2019    3:23 PM 04/14/2019    2:55 PM  CMP  Glucose 70 - 99 mg/dL 80  79  99   BUN 6 - 24 mg/dL 8  10  14    Creatinine 0.57 - 1.00 mg/dL 6.57  8.46  9.62   Sodium 134 - 144 mmol/L 140  139  137   Potassium 3.5 - 5.2 mmol/L 4.0  3.4  3.6   Chloride 96 - 106 mmol/L 102  108  105   CO2 20 - 29 mmol/L 22  21  23    Calcium  8.7 - 10.2 mg/dL 9.7  8.9  9.2   Total Protein 6.0 - 8.5 g/dL 7.5  6.8  7.2   Total Bilirubin 0.0 - 1.2 mg/dL 0.6  0.2  1.0   Alkaline Phos 44 - 121 IU/L 72  51  53   AST 0 - 40 IU/L 17  16  20    ALT 0 - 32 IU/L 14  13  18     Lipid Panel  No results found for: "CHOL", "TRIG", "HDL", "CHOLHDL", "VLDL", "LDLCALC", "LDLDIRECT"  CBC    Component Value Date/Time   WBC 7.6 05/17/2021 1137   WBC 8.7 10/29/2019 1523   RBC 4.34 05/17/2021 1137   RBC 4.05 10/29/2019 1523   HGB 13.0 05/17/2021 1137   HCT 40.2 05/17/2021 1137   PLT 261 05/17/2021 1137   MCV 93 05/17/2021 1137   MCH 30.0 05/17/2021 1137   MCH 29.9 10/29/2019 1523   MCHC 32.3 05/17/2021 1137   MCHC 31.8 10/29/2019 1523   RDW 14.0 05/17/2021 1137   LYMPHSABS 2.6 05/17/2021 1137   MONOABS 0.6 06/03/2018 1417   EOSABS 0.1 05/17/2021 1137   BASOSABS 0.0  05/17/2021 1137    ASSESSMENT AND PLAN: 1. Perimenopausal symptoms (Primary) Advised patient that current symptoms consistent with her being perimenopausal.  Discussed use of HRT vs non-HRT therapies to control symptoms.  Discuss trying her with Paxil to control flashes, mood swings and irritability. She is agreeable to this.  If she does not get good results with the Paxil, then we will resort to HRT. - PARoxetine (PAXIL) 10 MG tablet; Take 1 tablet (10 mg total) by mouth at bedtime.  Dispense: 30 tablet; Refill: 1  2. Chronic right shoulder pain Likely rotator cuff injury.  Will get  plain x-ray and refer to orthopedics.  Continue ibuprofen  as needed.  Refill given on Flexeril  as well. - DG Shoulder Right; Future - AMB referral to orthopedics  3. Intractable acute post-traumatic headache Rf given on Flexeril  - cyclobenzaprine  (FLEXERIL ) 5 MG tablet; Take 1 tablet (5 mg total) by mouth daily as needed for muscle spasms.  Dispense: 30 tablet; Refill: 1  4. Tobacco dependence Pt is current smoker. Patient advised to quit smoking. Discussed health risks associated with smoking including lung and other types of cancers, chronic lung diseases and CV risks.. Pt ready to give trail of quitting.   Discussed methods to help quit including quitting cold [place], use of NRT, Chantix and Bupropion.  Pt wanting to try: Nicotine  patches and gum.  We will start her at the 14 mg dose patches.  Went over with her how to use it. 3__ Minutes spent on counseling. F/U: Assess progress on subsequent visit. - nicotine  polacrilex (NICORETTE ) 2 MG gum; Take 1 each (2 mg total) by mouth as needed for smoking cessation.  Dispense: 100 tablet; Refill: 0 - nicotine  (NICODERM CQ  - DOSED IN MG/24 HOURS) 14 mg/24hr patch; Place 1 patch (14 mg total) onto the skin daily.  Dispense: 28 patch; Refill: 1  5. Neck pain, chronic I suspect some of her shoulder pain may be coming from the neck with a cervical radiculopathy component. - DG Cervical Spine Complete; Future - AMB referral to orthopedics  6. Elevated blood pressure reading without diagnosis of hypertension DASH diet discussed and encouraged.  Will bring her back in 4 weeks for blood pressure recheck with the clinical pharmacist  7. Need for vaccination against Streptococcus pneumoniae - Pneumococcal conjugate vaccine 20-valent    Patient was given the opportunity to ask questions.  Patient verbalized understanding of the plan and was able to repeat key elements of the plan.   This documentation was completed using Public librarian.  Any transcriptional errors are unintentional.  No orders of the defined types were placed in this encounter.    Requested Prescriptions    No prescriptions requested or ordered in this encounter    No follow-ups on file.  Concetta Dee, MD, FACP

## 2023-08-21 ENCOUNTER — Ambulatory Visit
Admission: RE | Admit: 2023-08-21 | Discharge: 2023-08-21 | Disposition: A | Discharge status: Home / Self Care | Source: Ambulatory Visit | Attending: Internal Medicine | Admitting: Internal Medicine

## 2023-08-21 DIAGNOSIS — G8929 Other chronic pain: Secondary | ICD-10-CM | POA: Diagnosis not present

## 2023-08-21 DIAGNOSIS — M542 Cervicalgia: Secondary | ICD-10-CM | POA: Diagnosis not present

## 2023-08-21 DIAGNOSIS — M25511 Pain in right shoulder: Secondary | ICD-10-CM | POA: Diagnosis not present

## 2023-08-27 ENCOUNTER — Ambulatory Visit: Payer: Self-pay

## 2023-09-16 ENCOUNTER — Encounter: Payer: Self-pay | Admitting: Pharmacist

## 2023-09-16 ENCOUNTER — Ambulatory Visit: Attending: Internal Medicine | Admitting: Pharmacist

## 2023-09-16 VITALS — BP 103/66 | HR 66

## 2023-09-16 DIAGNOSIS — R030 Elevated blood-pressure reading, without diagnosis of hypertension: Secondary | ICD-10-CM

## 2023-09-16 NOTE — Progress Notes (Signed)
   S:     No chief complaint on file.  53 y.o. female who presents for hypertension evaluation, education, and management.   Patient was referred and last seen by Primary Care Provider, Dr. Lincoln Renshaw, on 08/18/2023. BP was 139/70 mmHg at that visit. She has no formal dx of HTN but was asked to return for a recheck.  Today, patient arrives in good spirits and presents without assistance. Denies dizziness, headache, blurred vision, swelling.   Family/Social history:  -Fhx: HTN -Tobacco: some day smoker   -Alcohol: none reported   Medication adherence: does not currently take antihypertensive therapy.  Current antihypertensives include: none  Antihypertensives tried in the past include: none  Reported home BP readings: none  Patient reported dietary habits:  -Compliant with sodium restriction -Denies excessive caffeine intake  Patient-reported exercise habits: none  O:  Today's Vitals   09/16/23 1510  BP: 103/66  Pulse: 66   There is no height or weight on file to calculate BMI.  Last 3 Office BP readings: BP Readings from Last 3 Encounters:  08/18/23 139/70  02/18/23 118/62  11/12/22 132/68    BMET    Component Value Date/Time   NA 140 05/17/2021 1137   K 4.0 05/17/2021 1137   CL 102 05/17/2021 1137   CO2 22 05/17/2021 1137   GLUCOSE 80 05/17/2021 1137   GLUCOSE 79 10/29/2019 1523   BUN 8 05/17/2021 1137   CREATININE 0.72 05/17/2021 1137   CALCIUM  9.7 05/17/2021 1137   GFRNONAA >60 10/29/2019 1523   GFRAA >60 10/29/2019 1523    Renal function: CrCl cannot be calculated (Patient's most recent lab result is older than the maximum 21 days allowed.).  Clinical ASCVD: No  The ASCVD Risk score (Arnett DK, et al., 2019) failed to calculate for the following reasons:   Cannot find a previous HDL lab   Cannot find a previous total cholesterol lab  Patient is participating in a Managed Medicaid Plan: No    A/P: Hypertension Undiagnosed. Her BP is currently  normotensive. She is not on any antihypertensive medication. BP goal < 120/80 mmHg.  -Continued without medication at this time. .  -Counseled on lifestyle modifications for blood pressure control including reduced dietary sodium, increased exercise, adequate sleep. -Encouraged patient to check BP at home and bring log of readings to next visit. Counseled on proper use of home BP cuff.   Results reviewed and written information provided.    Written patient instructions provided. Patient verbalized understanding of treatment plan.  Total time in face to face counseling 30 minutes.    Follow-up:  Pharmacist prn. PCP clinic visit 12/19/2023.   Marene Shape, PharmD, Becky Bowels, CPP Clinical Pharmacist [facility] [phone]

## 2023-11-03 ENCOUNTER — Other Ambulatory Visit: Payer: Self-pay | Admitting: Internal Medicine

## 2023-11-03 DIAGNOSIS — N951 Menopausal and female climacteric states: Secondary | ICD-10-CM

## 2023-12-08 ENCOUNTER — Other Ambulatory Visit: Payer: Self-pay

## 2023-12-08 ENCOUNTER — Emergency Department (HOSPITAL_COMMUNITY)

## 2023-12-08 ENCOUNTER — Emergency Department (HOSPITAL_COMMUNITY)
Admission: EM | Admit: 2023-12-08 | Discharge: 2023-12-08 | Disposition: A | Discharge status: Home / Self Care | Attending: Emergency Medicine | Admitting: Emergency Medicine

## 2023-12-08 DIAGNOSIS — S0990XA Unspecified injury of head, initial encounter: Secondary | ICD-10-CM | POA: Diagnosis not present

## 2023-12-08 DIAGNOSIS — T797XXA Traumatic subcutaneous emphysema, initial encounter: Secondary | ICD-10-CM | POA: Diagnosis not present

## 2023-12-08 DIAGNOSIS — S199XXA Unspecified injury of neck, initial encounter: Secondary | ICD-10-CM | POA: Diagnosis not present

## 2023-12-08 DIAGNOSIS — S0181XA Laceration without foreign body of other part of head, initial encounter: Secondary | ICD-10-CM | POA: Insufficient documentation

## 2023-12-08 DIAGNOSIS — F329 Major depressive disorder, single episode, unspecified: Secondary | ICD-10-CM | POA: Insufficient documentation

## 2023-12-08 DIAGNOSIS — R45851 Suicidal ideations: Secondary | ICD-10-CM | POA: Diagnosis not present

## 2023-12-08 DIAGNOSIS — S299XXA Unspecified injury of thorax, initial encounter: Secondary | ICD-10-CM | POA: Diagnosis not present

## 2023-12-08 DIAGNOSIS — W19XXXA Unspecified fall, initial encounter: Secondary | ICD-10-CM | POA: Diagnosis not present

## 2023-12-08 DIAGNOSIS — R404 Transient alteration of awareness: Secondary | ICD-10-CM | POA: Diagnosis not present

## 2023-12-08 DIAGNOSIS — Y906 Blood alcohol level of 120-199 mg/100 ml: Secondary | ICD-10-CM | POA: Diagnosis not present

## 2023-12-08 DIAGNOSIS — F322 Major depressive disorder, single episode, severe without psychotic features: Secondary | ICD-10-CM | POA: Insufficient documentation

## 2023-12-08 DIAGNOSIS — S3993XA Unspecified injury of pelvis, initial encounter: Secondary | ICD-10-CM | POA: Diagnosis not present

## 2023-12-08 DIAGNOSIS — S01112A Laceration without foreign body of left eyelid and periocular area, initial encounter: Secondary | ICD-10-CM | POA: Diagnosis not present

## 2023-12-08 LAB — URINALYSIS, ROUTINE W REFLEX MICROSCOPIC
Bilirubin Urine: NEGATIVE
Glucose, UA: NEGATIVE mg/dL
Ketones, ur: NEGATIVE mg/dL
Leukocytes,Ua: NEGATIVE
Nitrite: NEGATIVE
Protein, ur: NEGATIVE mg/dL
Specific Gravity, Urine: 1.003 — ABNORMAL LOW (ref 1.005–1.030)
pH: 5 (ref 5.0–8.0)

## 2023-12-08 LAB — CBC
HCT: 38.6 % (ref 36.0–46.0)
Hemoglobin: 12.9 g/dL (ref 12.0–15.0)
MCH: 31 pg (ref 26.0–34.0)
MCHC: 33.4 g/dL (ref 30.0–36.0)
MCV: 92.8 fL (ref 80.0–100.0)
Platelets: 247 K/uL (ref 150–400)
RBC: 4.16 MIL/uL (ref 3.87–5.11)
RDW: 15 % (ref 11.5–15.5)
WBC: 11.6 K/uL — ABNORMAL HIGH (ref 4.0–10.5)
nRBC: 0 % (ref 0.0–0.2)

## 2023-12-08 LAB — I-STAT CHEM 8, ED
BUN: 10 mg/dL (ref 6–20)
Calcium, Ion: 1.13 mmol/L — ABNORMAL LOW (ref 1.15–1.40)
Chloride: 108 mmol/L (ref 98–111)
Creatinine, Ser: 0.9 mg/dL (ref 0.44–1.00)
Glucose, Bld: 100 mg/dL — ABNORMAL HIGH (ref 70–99)
HCT: 40 % (ref 36.0–46.0)
Hemoglobin: 13.6 g/dL (ref 12.0–15.0)
Potassium: 3.5 mmol/L (ref 3.5–5.1)
Sodium: 143 mmol/L (ref 135–145)
TCO2: 21 mmol/L — ABNORMAL LOW (ref 22–32)

## 2023-12-08 LAB — I-STAT CG4 LACTIC ACID, ED: Lactic Acid, Venous: 1.7 mmol/L (ref 0.5–1.9)

## 2023-12-08 LAB — COMPREHENSIVE METABOLIC PANEL WITH GFR
ALT: 18 U/L (ref 0–44)
AST: 24 U/L (ref 15–41)
Albumin: 3.9 g/dL (ref 3.5–5.0)
Alkaline Phosphatase: 60 U/L (ref 38–126)
Anion gap: 13 (ref 5–15)
BUN: 10 mg/dL (ref 6–20)
CO2: 18 mmol/L — ABNORMAL LOW (ref 22–32)
Calcium: 9.1 mg/dL (ref 8.9–10.3)
Chloride: 109 mmol/L (ref 98–111)
Creatinine, Ser: 0.68 mg/dL (ref 0.44–1.00)
GFR, Estimated: >60 mL/min (ref >60)
Glucose, Bld: 101 mg/dL — ABNORMAL HIGH (ref 70–99)
Potassium: 3.5 mmol/L (ref 3.5–5.1)
Sodium: 140 mmol/L (ref 135–145)
Total Bilirubin: 0.7 mg/dL (ref 0.0–1.2)
Total Protein: 7 g/dL (ref 6.5–8.1)

## 2023-12-08 LAB — PROTIME-INR
INR: 0.9 (ref 0.8–1.2)
Prothrombin Time: 13 s (ref 11.4–15.2)

## 2023-12-08 LAB — ETHANOL: Alcohol, Ethyl (B): 187 mg/dL — ABNORMAL HIGH (ref <15)

## 2023-12-08 MED ORDER — LIDOCAINE-EPINEPHRINE (PF) 2 %-1:200000 IJ SOLN
10.0000 mL | Freq: Once | INTRAMUSCULAR | Status: DC
  Filled 2023-12-08: qty 20

## 2023-12-08 MED ORDER — ONDANSETRON HCL 4 MG/2ML IJ SOLN
INTRAMUSCULAR | Status: AC
  Filled 2023-12-08: qty 2

## 2023-12-08 MED ORDER — ONDANSETRON HCL 4 MG/2ML IJ SOLN
4.0000 mg | Freq: Once | INTRAMUSCULAR | Status: AC
  Administered 2023-12-08: 4 mg via INTRAVENOUS
  Filled 2023-12-08: qty 2

## 2023-12-08 MED ORDER — FENTANYL CITRATE PF 50 MCG/ML IJ SOSY
50.0000 ug | PREFILLED_SYRINGE | Freq: Once | INTRAMUSCULAR | Status: AC
  Administered 2023-12-08: 50 ug via INTRAVENOUS
  Filled 2023-12-08: qty 1

## 2023-12-08 MED ORDER — ONDANSETRON HCL 4 MG/2ML IJ SOLN
4.0000 mg | Freq: Once | INTRAMUSCULAR | Status: AC
  Administered 2023-12-08: 4 mg via INTRAVENOUS

## 2023-12-08 MED ORDER — LACTATED RINGERS IV BOLUS
1000.0000 mL | Freq: Once | INTRAVENOUS | Status: AC
  Administered 2023-12-08: 1000 mL via INTRAVENOUS

## 2023-12-08 MED ORDER — FENTANYL CITRATE PF 50 MCG/ML IJ SOSY
50.0000 ug | PREFILLED_SYRINGE | INTRAMUSCULAR | Status: DC | PRN
  Administered 2023-12-08: 50 ug via INTRAVENOUS
  Filled 2023-12-08: qty 1

## 2023-12-08 MED ORDER — DIAZEPAM 5 MG/ML IJ SOLN
5.0000 mg | Freq: Once | INTRAMUSCULAR | Status: AC
  Administered 2023-12-08: 5 mg via INTRAVENOUS
  Filled 2023-12-08: qty 2

## 2023-12-08 NOTE — BH Assessment (Signed)
 TTS attempted to refer pt to [facility] telehealth for an assessment. Per [facility] coordinator, pt will not be seen prior to 8 am therefore, they will refer back to the in house provider.

## 2023-12-08 NOTE — ED Triage Notes (Signed)
 Pt BIB [facility] from a party. Pt was getting into the car when she fell, hitting face on the concrete. 5cm lac to L eyebrow; bleeding controlled. ETOH, +LOC, GCS 14 on arrival    BP 200/110

## 2023-12-08 NOTE — ED Provider Notes (Addendum)
  EMERGENCY DEPARTMENT AT [facility] Provider Note   CSN: [id number] Arrival date & time: 12/08/23  9957     History Chief Complaint  Patient presents with   Fall    HPI Bonnie Conrad is a 53 y.o. female presenting for ground-level fall.  Brought in as a level 2 trauma for altered mental status. Per EMS, was drinking tonight at home, fell hit her head large laceration to left anterior forehead.  Patient clearly intoxicated, very agitated stating that she is depressed and very sad.  Endorses headache but otherwise very tangential during the history not answering questions directly.   Patient's recorded medical, surgical, social, medication list and allergies were reviewed in the Snapshot window as part of the initial history.   Review of Systems   Review of Systems  Constitutional:  Negative for chills and fever.  HENT:  Negative for ear pain and sore throat.   Eyes:  Negative for pain and visual disturbance.  Respiratory:  Negative for cough and shortness of breath.   Cardiovascular:  Negative for chest pain and palpitations.  Gastrointestinal:  Negative for abdominal pain and vomiting.  Genitourinary:  Negative for dysuria and hematuria.  Musculoskeletal:  Negative for arthralgias and back pain.  Skin:  Negative for color change and rash.  Neurological:  Positive for headaches. Negative for seizures and syncope.  All other systems reviewed and are negative.   Physical Exam Updated Vital Signs BP 109/65   Pulse 79   Temp (!) 97.3 F (36.3 C) (Temporal)   Resp 15   Ht 4' 9 (1.448 m)   Wt 48 kg   SpO2 99%   BMI 22.90 kg/m  Physical Exam Vitals and nursing note reviewed.  Constitutional:      General: She is not in acute distress.    Appearance: She is well-developed.  HENT:     Head: Normocephalic and atraumatic.  Eyes:     Conjunctiva/sclera: Conjunctivae normal.  Cardiovascular:     Rate and Rhythm: Normal rate and regular  rhythm.     Heart sounds: No murmur heard. Pulmonary:     Effort: Pulmonary effort is normal. No respiratory distress.     Breath sounds: Normal breath sounds.  Abdominal:     General: There is no distension.     Palpations: Abdomen is soft.     Tenderness: There is no abdominal tenderness. There is no right CVA tenderness or left CVA tenderness.  Musculoskeletal:        General: Signs of injury (Stellate complex laceration to left forehead extending over left eyelid.  Approximately 7 cm in length combined.) present. No swelling or tenderness. Normal range of motion.     Cervical back: Neck supple.  Skin:    General: Skin is warm and dry.  Neurological:     General: No focal deficit present.     Mental Status: She is alert. Mental status is at baseline. She is disoriented.     Cranial Nerves: No cranial nerve deficit.      ED Course/ Medical Decision Making/ A&P    Procedures .Laceration Repair  Date/Time: 12/08/2023 5:42 AM  Performed by: Jerral Meth, MD Authorized by: Jerral Meth, MD   Consent:    Consent obtained:  Verbal   Consent given by:  Patient   Risks, benefits, and alternatives were discussed: yes   Laceration details:    Length (cm):  9 Treatment:    Amount of cleaning:  Extensive Skin  repair:    Repair method:  Sutures   Suture material:  Fast-absorbing gut   Suture technique:  Simple interrupted   Number of sutures:  7 Repair type:    Repair type:  Intermediate Post-procedure details:    Procedure completion:  Tolerated    Medications Ordered in ED Medications  lidocaine -EPINEPHrine  (XYLOCAINE  W/EPI) 2 %-1:200000 (PF) injection 10 mL (has no administration in time range)  fentaNYL  (SUBLIMAZE ) injection 50 mcg (50 mcg Intravenous Given 12/08/23 0414)  diazepam  (VALIUM ) injection 5 mg (5 mg Intravenous Given 12/08/23 0058)  ondansetron  (ZOFRAN ) injection 4 mg (4 mg Intravenous Given 12/08/23 0100)  fentaNYL  (SUBLIMAZE ) injection 50 mcg (50  mcg Intravenous Given 12/08/23 0203)  ondansetron  (ZOFRAN ) injection 4 mg (4 mg Intravenous Given 12/08/23 0414)  lactated ringers  bolus 1,000 mL (1,000 mLs Intravenous New Bag/Given 12/08/23 0418)   Medical Decision Making:    Bonnie Conrad is a 53 y.o. female who presented to the ED today with a high mechanisma trauma, detailed above.    By institutional and departmental policy this was activated as a level 2 trauma. Handoff received from EMS.  Patient placed on continuous vitals and telemetry monitoring while in ED which was reviewed periodically.   Given this mechanism of trauma, a full physical exam was performed.  Reviewed and confirmed nursing documentation for past medical history, family history, social history.    Initial Assessment/Plan:   I was called emergently to patient's bedside for a primary survey.  Primary survey: Airway intact.  BL breath sounds present.   Circulation established with WNL BP, 2 large bore IVs, and radial/femoral pulses.   Disability evaluation negative. No obvious disability requiring intervention.   Patient fully exposed and all injuries were noted, any penetrating injuries were labeled with radiopaque markers.  No emergent interventions took place in the primary survey.    Patient stable for CXR that demonstrated no traumatic hemopneumothorax and PXR that demonstrated no unstable pelvic fractures.  EFAST deferred.   Secondary survey: Once patient was stabilized, I personally performed a secondary survey to evaluate for any other injuries.  Results of this evaluation documented in the physical exam section. This is a patient presenting with a high mechanism trauma.  As such, I have considered intracranial injuries including intracranial hemorrhage, intrathoracic injuries including blunt myocardial or blunt lung injury, blunt abdominal injuries including aortic dissection, bladder injury, spleen injury, liver injury and I have considered  orthopedic injuries including extremity or spinal injury.   This was all evaluated by the below imaging as well as concurrently ordered laboratory evaluation which was reviewed.  Radiology: All radiology results were reviewed independently and agree with reads per radiology provider. CT HEAD WO CONTRAST Result Date: 12/08/2023 CLINICAL DATA:  Head trauma, moderate-severe; Polytrauma, blunt; Facial trauma, blunt ETOH, fall onto concrete, lac to L eyebrow EXAM: CT HEAD WITHOUT CONTRAST CT MAXILLOFACIAL WITHOUT CONTRAST CT CERVICAL SPINE WITHOUT CONTRAST TECHNIQUE: Multidetector CT imaging of the head, cervical spine, and maxillofacial structures were performed using the standard protocol without intravenous contrast. Multiplanar CT image reconstructions of the cervical spine and maxillofacial structures were also generated. RADIATION DOSE REDUCTION: This exam was performed according to the departmental dose-optimization program which includes automated exposure control, adjustment of the mA and/or kV according to patient size and/or use of iterative reconstruction technique. COMPARISON:  None Available. FINDINGS: CT HEAD FINDINGS Brain: No evidence of large-territorial acute infarction. No parenchymal hemorrhage. No mass lesion. No extra-axial collection. No mass effect or midline shift.  No hydrocephalus. Basilar cisterns are patent. Vascular: No hyperdense vessel. Skull: No acute fracture or focal lesion. Other: None. CT MAXILLOFACIAL FINDINGS Osseous: No fracture or mandibular dislocation. No destructive process. Sinuses/Orbits: Left sphenoid and maxillary sinus mucosal thickening. Otherwise paranasal sinuses and mastoid air cells are clear. The orbits are unremarkable. Soft tissues: Left periorbital dermal laceration with subcutaneus soft tissue emphysema. No large hematoma. No retained radiopaque foreign body. CT CERVICAL SPINE FINDINGS Alignment: Normal. Skull base and vertebrae: No acute fracture. No  aggressive appearing focal osseous lesion or focal pathologic process. Soft tissues and spinal canal: No prevertebral fluid or swelling. No visible canal hematoma. Upper chest: Biapical pleural/pulmonary scarring. Other: None. IMPRESSION: 1. No acute intracranial abnormality. 2.  No acute displaced facial fracture. 3. No acute displaced fracture or traumatic listhesis of the cervical spine. 4. Left periorbital dermal laceration with subcutaneus soft tissue emphysema. No large hematoma. No retained radiopaque foreign body. Electronically Signed   By: Morgane  Naveau M.D.   On: 12/08/2023 01:32   CT MAXILLOFACIAL WO CONTRAST Result Date: 12/08/2023 CLINICAL DATA:  Head trauma, moderate-severe; Polytrauma, blunt; Facial trauma, blunt ETOH, fall onto concrete, lac to L eyebrow EXAM: CT HEAD WITHOUT CONTRAST CT MAXILLOFACIAL WITHOUT CONTRAST CT CERVICAL SPINE WITHOUT CONTRAST TECHNIQUE: Multidetector CT imaging of the head, cervical spine, and maxillofacial structures were performed using the standard protocol without intravenous contrast. Multiplanar CT image reconstructions of the cervical spine and maxillofacial structures were also generated. RADIATION DOSE REDUCTION: This exam was performed according to the departmental dose-optimization program which includes automated exposure control, adjustment of the mA and/or kV according to patient size and/or use of iterative reconstruction technique. COMPARISON:  None Available. FINDINGS: CT HEAD FINDINGS Brain: No evidence of large-territorial acute infarction. No parenchymal hemorrhage. No mass lesion. No extra-axial collection. No mass effect or midline shift. No hydrocephalus. Basilar cisterns are patent. Vascular: No hyperdense vessel. Skull: No acute fracture or focal lesion. Other: None. CT MAXILLOFACIAL FINDINGS Osseous: No fracture or mandibular dislocation. No destructive process. Sinuses/Orbits: Left sphenoid and maxillary sinus mucosal thickening. Otherwise  paranasal sinuses and mastoid air cells are clear. The orbits are unremarkable. Soft tissues: Left periorbital dermal laceration with subcutaneus soft tissue emphysema. No large hematoma. No retained radiopaque foreign body. CT CERVICAL SPINE FINDINGS Alignment: Normal. Skull base and vertebrae: No acute fracture. No aggressive appearing focal osseous lesion or focal pathologic process. Soft tissues and spinal canal: No prevertebral fluid or swelling. No visible canal hematoma. Upper chest: Biapical pleural/pulmonary scarring. Other: None. IMPRESSION: 1. No acute intracranial abnormality. 2.  No acute displaced facial fracture. 3. No acute displaced fracture or traumatic listhesis of the cervical spine. 4. Left periorbital dermal laceration with subcutaneus soft tissue emphysema. No large hematoma. No retained radiopaque foreign body. Electronically Signed   By: Morgane  Naveau M.D.   On: 12/08/2023 01:32   CT CERVICAL SPINE WO CONTRAST Result Date: 12/08/2023 CLINICAL DATA:  Head trauma, moderate-severe; Polytrauma, blunt; Facial trauma, blunt ETOH, fall onto concrete, lac to L eyebrow EXAM: CT HEAD WITHOUT CONTRAST CT MAXILLOFACIAL WITHOUT CONTRAST CT CERVICAL SPINE WITHOUT CONTRAST TECHNIQUE: Multidetector CT imaging of the head, cervical spine, and maxillofacial structures were performed using the standard protocol without intravenous contrast. Multiplanar CT image reconstructions of the cervical spine and maxillofacial structures were also generated. RADIATION DOSE REDUCTION: This exam was performed according to the departmental dose-optimization program which includes automated exposure control, adjustment of the mA and/or kV according to patient size and/or use of iterative reconstruction technique. COMPARISON:  None Available. FINDINGS: CT HEAD FINDINGS Brain: No evidence of large-territorial acute infarction. No parenchymal hemorrhage. No mass lesion. No extra-axial collection. No mass effect or midline  shift. No hydrocephalus. Basilar cisterns are patent. Vascular: No hyperdense vessel. Skull: No acute fracture or focal lesion. Other: None. CT MAXILLOFACIAL FINDINGS Osseous: No fracture or mandibular dislocation. No destructive process. Sinuses/Orbits: Left sphenoid and maxillary sinus mucosal thickening. Otherwise paranasal sinuses and mastoid air cells are clear. The orbits are unremarkable. Soft tissues: Left periorbital dermal laceration with subcutaneus soft tissue emphysema. No large hematoma. No retained radiopaque foreign body. CT CERVICAL SPINE FINDINGS Alignment: Normal. Skull base and vertebrae: No acute fracture. No aggressive appearing focal osseous lesion or focal pathologic process. Soft tissues and spinal canal: No prevertebral fluid or swelling. No visible canal hematoma. Upper chest: Biapical pleural/pulmonary scarring. Other: None. IMPRESSION: 1. No acute intracranial abnormality. 2.  No acute displaced facial fracture. 3. No acute displaced fracture or traumatic listhesis of the cervical spine. 4. Left periorbital dermal laceration with subcutaneus soft tissue emphysema. No large hematoma. No retained radiopaque foreign body. Electronically Signed   By: Morgane  Naveau M.D.   On: 12/08/2023 01:32   DG Chest Port 1 View Result Date: 12/08/2023 CLINICAL DATA:  Trauma  getting into the car when she fell, etoch + EXAM: PORTABLE CHEST 1 VIEW COMPARISON:  Chest x-ray 11/11/2021, CT chest 10/29/2019 FINDINGS: The heart and mediastinal contours are within normal limits. No focal consolidation. No pulmonary edema. No pleural effusion. No pneumothorax. No acute osseous abnormality. IMPRESSION: No active disease. Electronically Signed   By: Morgane  Naveau M.D.   On: 12/08/2023 01:14   DG Pelvis Portable Result Date: 12/08/2023 CLINICAL DATA:  Trauma getting into the car when she fell, etoch + EXAM: PORTABLE PELVIS 1-2 VIEWS COMPARISON:  None Available. FINDINGS: There is no evidence of pelvic  fracture or diastasis. No pelvic bone lesions are seen. IMPRESSION: Negative. Electronically Signed   By: Morgane  Naveau M.D.   On: 12/08/2023 01:14    Final Reassessment and Plan:   No acute fractures.  Lacerations repaired as above.  Patient continued to improve throughout the morning.  After she metabolized her intoxication, she states that she has been very sad lately and has been drinking heavier.  Denied active attempts to hurt herself but states that she has been having passive suicidal ideation.  Does not have any connection with the local resources or primary care.  Psychiatric consultation for recommendations on management of combined substance use disorder and depression coordinated.  Patient placed in psychiatrical protocol pending their recommendations.  Clinical Impression:  1. Injury of head, initial encounter      Data Unavailable   Final Clinical Impression(s) / ED Diagnoses Final diagnoses:  Injury of head, initial encounter    Rx / DC Orders ED Discharge Orders     None         Jerral Meth, MD 12/08/23 [id number]    Jerral Meth, MD 12/08/23 [phone]

## 2023-12-08 NOTE — ED Notes (Signed)
Provider made aware of pts BP.

## 2023-12-08 NOTE — ED Notes (Signed)
 Pt transported to CT ?
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
## 2023-12-08 NOTE — Plan of Care (Cosign Needed)
 Psychiatry consult service consulted but this patient appears to needs TTS eval. Messaged attending EDP this information. Will d/c consult order.   Karleen Kaufmann, MD PGY-4

## 2023-12-08 NOTE — Discharge Instructions (Addendum)
 Based on what you have shared, a list of resources for outpatient therapy and psychiatry is provided below to get you started back on treatment.  It is imperative that you follow through with treatment within 5-7 days from the day of discharge to prevent any further risk to your safety or mental well-being.  You are not limited to the list provided.  In case of an urgent crisis, you may contact the Mobile Crisis Unit with [facility] at [phone].  Can visit psychologytoday.com        Outpatient Services for Therapy and Medication Management for [facility]  [facility] [address], [place], [address] [phone] phone  New Patient Assessment/Therapy Walk-ins Monday-Friday 8am until slots are full.arrive by 7 am second floor     NO ASSESSMENT/THERAPY WALK-INS ON TUESDAYS OR THURSDAYS  New Patient Psychiatry/Medication Management Walk-ins Monday-Friday: 8am-11am  For all walk-ins, we ask that you arrive by 7:30am because patient will be seen in the order of arrival.  Availability is limited; therefore, you may not be seen on the same day that you walk-in.  Our goal is to serve and meet the needs of our community to the best of our ability.   [facility] [address] [place], [place], [address] [phone] phone  [facility] [address] [place], [place], [address] [phone] phone [phone] phone ([address], [address], Anthem/Elevance, [address], Centivo, [address], [address], Healthy [place], [place], [place], [address], ConocoPhillips, [place], UHC, American Financial, [place], Out of Network)  [employer], [place] [address] [place], [place], [address] [phone] phone ([place], Anthem/Elevance, Sanmina-SCI Options/Carelon, BCBS, [address], [place], [place], [place], [place], Harrah's Entertainment, [place], [place], [place],  [facility])  [employer] [address] [place], [place], [address] [phone] phone (Medicaid, ask about other insurance)  [facility] [address], [address] [place], [place], [address] [phone] phone [phone] fax ([address], [place] , [place], [place], [place] Health Choice, UHC, General Electric, Self-Pay)  Sarah Lempka [address] [place], [place], [address] [phone] phone ([address], Anthem/Elevance, [address], [address], [place], CSX Corporation, [place], [place], [place], Harrah's Entertainment, [place], [place], [place], [facility])  [facility] - 6-8 MONTH WAIT FOR THERAPY; SOONER FOR MEDICATION MANAGEMENT [address], [address] [place], [place], [address] [phone] phone ([address] AmeriHealth [address] - [place], [address], [place], [place], Friday Health Plans, [address], BCBS Healthy [place], [place], [address], [place], [place], [place], [place], Tricare, UHC, Safeco Corporation, [place])  [facility] [address] [place], [place], [address] [phone] phone  [facility] [address], [address] [place], [place], [address] [phone] phone  [facility] [address] [place], [place], [address] [phone] phone  [facility] - THERAPY ONLY [address]  [place], [place], [address] [phone] phone  [facility], [place] [address], [place], [address] [phone] phone  [facility] [address] [place], [place], [address] [phone] phone [phone] fax  [facility] [address] [place], [place], [address] [phone] phone [phone] fax  [facility] [phone] [address] [place], [place], [address] [phone] phone  Janit Griffins [address] [place], [place], [address]  [phone] phone  [facility]  (Adults Only) [address] [place], [place], [address]  [phone] phone [phone] fax   It is imperative that you  follow through with treatment recommendations within 5-7 days from the day of discharge to mitigate further risk to your safety and overall mental well-being.  A list of outpatient therapy and psychiatric providers for medication management has been provided below to get you started in finding the right provider for you.            [facility] [address] [place], [place], [address] [phone] phone (Medicare, Private insurance except Tricare, [place], and [facility])  [facility] [address] [place], [place], [address] [address] phone ([address] AmeriHealth [facility], [address], [place], [place], Friday Health Plans, [address], BCBS Healthy [place], [place], [address], [place], [place], [place], [place], Tricare, [facility], Safeco Corporation, Eli Lilly and Company)  [employer] [phone] [address] [place], [place], [address] [phone] phone [phone] phone [phone] fax  [facility] [address] [place], [place], [address] [phone] phone (Call to confirm insurance coverage) Consultation & Support Services     o Drop-In Hours: 1:00 PM to 5:00 PM     o Days: Monday - Thursday  Crisis Services (24/7)   [facility] [address] [place], [place], [address] [phone] phone ([address] [place], [place], [place] Medicaid, [place] and [place], Ultimate Health Services Inc)      [facility] [address] [place], [place], [address] [phone] phone [email or web address]  (to complete the intake form and upload ID and insurance cards)  [facility] [address] [place], [place], [address] [phone] phone ([address], [place], [address] Calpine Corporation, [place], [place],  [place], [facility], [place], and certain Medicaid plans)  [facility] [phone] N[address], [address] [place], [place], [address] [phone] phone [phone] fax (Medicaid, Medicare, Self-pay, call about other insurance coverage)  [facility] (age 78+) [address] [place], [place], [address] [phone] hone [phone] fax ([place], [address], [place], [place], [place], [address], [place], [place], [place], [place], certain [employer] Group, [facility], UMR)  [facility] [address] [place], [place], [address] [phone] phone (Medicare, Medicaid, [place], call about other insurance coverage)  [facility] [address] [place], [place], [address] [phone] phone [phone] fax (Call [phone] to see what insurance is accepted) Georgean Lo, MD specializes in geropsych)  [facility], [facility]  (medication management only) [address], [address] [place], [place], [address] [phone] phone [phone] fax ([address] Medicaid, [place], [place], [place], [place], [place], [place], [place])  [facility] (medication management only) [address], [address] [place], [place], [address] [phone] phone [phone] fax ([address], Medicare, [place], [place], Tricare [place])  [facility] [address] [place], [place], [address] [phone] phone [phone] fax ([address], [place], [place], [place], [place], [facility], [facility] Medicaid/Greenleaf Health Choice)  [facility], [employer]. [address] [place], [place], [address] [phone] phone [phone] fax (Medicare, Medicaid, [facility])  [facility] [address] [place], [place] [address] [phone] phone ([address] [place], [place], CBHA, MedCost, Medicare, [place], [facility]) Does genetic testing for medications; does transcranial magnetic stimulation along with basic  services)  [facility] [address] [place], [place], [address] [phone] phone (Call about insurance coverage)  [facility] [address] [place], [place], [address] [phone] phone [phone] fax (Call about insurance coverage)  [facility] [address] [place], [place], [address] [phone] phone [phone] fax (Call about insurance coverage)  [employer] [address] [place], [address] [phone] phone (Medicaid, Tricare, [place], [place], [place])  [employer] [address] [place], [place], [address] [phone] phone (Medicaid, Medicare, call about other insurance coverage)  [facility] [address] [place], [place], [address] [phone] phone [phone] fax (Medicaid, Medicare, Tricare, call about other insurance coverage)  [facility] [address] [place], [address] [phone] phone ([address], [address], [place], [place], [place], [place] types - Alliance, Secretary/administrator, Partners, [place], [place] Health Choice, Healthy [place], [place], [place]. [place], and Complete)  [facility] [address] [place], [place], [address] [phone] phone Completely online treatment platform Contact: Personal assistant - [employer] Specialist [phone] phone [phone] fax ([address], [place], [place], Friday Health Plan, [place], [place], [place], [place], [place], [place])

## 2023-12-08 NOTE — ED Provider Notes (Signed)
  Physical Exam  BP 109/65   Pulse 79   Temp (!) 97.3 F (36.3 C) (Temporal)   Resp 15   Ht 4' 9 (1.448 m)   Wt 48 kg   SpO2 99%   BMI 22.90 kg/m   Physical Exam Vitals and nursing note reviewed.  HENT:     Head: Normocephalic and atraumatic.  Eyes:     Pupils: Pupils are equal, round, and reactive to light.  Cardiovascular:     Rate and Rhythm: Normal rate and regular rhythm.  Pulmonary:     Effort: Pulmonary effort is normal.     Breath sounds: Normal breath sounds.  Abdominal:     Palpations: Abdomen is soft.     Tenderness: There is no abdominal tenderness.  Skin:    General: Skin is warm and dry.  Neurological:     Mental Status: She is alert.  Psychiatric:        Mood and Affect: Mood normal.     Procedures  Procedures  ED Course / MDM   Clinical Course as of 12/08/23 1206  Mon Dec 08, 2023  1206 Patient has been evaluated by TTS team and collateral history has been obtained from patient's sister.  She is cleared for discharge from psychiatric standpoint at this time.  She will follow-up with outpatient resources. [MP]    Clinical Course User Index [MP] Pamella Ozell LABOR, DO   Medical Decision Making I, Ozell Pamella DO, have assumed care of this patient from the previous provider pending TTS evaluation for SI and disposition  Amount and/or Complexity of Data Reviewed Labs: ordered. Radiology: ordered.  Risk Prescription drug management.          Pamella Ozell LABOR, DO 12/08/23 1206

## 2023-12-08 NOTE — Progress Notes (Signed)
..  Trauma Response Nurse Documentation   Bonnie Conrad is a 53 y.o. female arriving to [facility] ED via [facility]  On No antithrombotic. Trauma was activated as a Level 2 by charge nurse based on the following trauma criteria GCS 10-14 associated with trauma or AVPU < A.  Patient cleared for CT by Dr. Jerral. Pt transported to CT with trauma response nurse present to monitor. RN remained with the patient throughout their absence from the department for clinical observation.   GCS 14.  Trauma MD Arrival Time: N/A  History   Past Medical History:  Diagnosis Date   Abdominal hernia    Anemia    Anxiety    Arthritis    Blood transfusion without reported diagnosis    Depression    hosp during pregnancy at [facility]   Hernia of abdominal cavity 2009   Ovarian cyst      Past Surgical History:  Procedure Laterality Date   BREAST BIOPSY Right 05/01/2023   MM RT BREAST BX W LOC DEV 1ST LESION IMAGE BX SPEC STEREO GUIDE 05/01/2023 [facility]   BREAST BIOPSY Left 05/01/2023   MM LT BREAST BX W LOC DEV 1ST LESION IMAGE BX SPEC STEREO GUIDE 05/01/2023 [facility]   COLONOSCOPY WITH ESOPHAGOGASTRODUODENOSCOPY (EGD)     DILATION AND CURETTAGE OF UTERUS     EPIGASTRIC HERNIA REPAIR N/A 06/05/2018   Procedure: OPEN REPAIR OF INCARCERATED EPIGASTRIC HERNIA WITH MESH;  Surgeon: Signe Mitzie LABOR, MD;  Location: [facility] OR;  Service: General;  Laterality: N/A;   ESOPHAGEAL MANOMETRY N/A 06/29/2018   Procedure: ESOPHAGEAL MANOMETRY (EM);  Surgeon: Shila Gustav GAILS, MD;  Location: [facility];  Service: Endoscopy;  Laterality: N/A;   TUBAL LIGATION  07/25/2011   Procedure: POST PARTUM TUBAL LIGATION;  Surgeon: Winton Felt, MD;  Location: [facility]S;  Service: Gynecology;  Laterality: Bilateral;       Initial Focused Assessment (If applicable, or please see trauma documentation):  L2 trauma activated after arrival due to pts GCS 14 in the setting of head trauma.  CT's  Completed:   CT Head, CT Maxillofacial, and CT C-Spine   Interventions:   Plan for disposition:  Other MTF  Consults completed:  None  Event Summary:Pt arrived via [facility] from outside a vehicle in a parking lot. Per bystanders pt fell to the ground while attempting to get in to vehicle hitting head on concrete. +etoh, +LOC. 5cm lac above L eyebrow, minimal bleeding ntoed. Ccollar in place by EMS, IV L hand.  Labs drawn. Pt given Zofran  and Valium  for n/v and anxiety, pt intermittently attempting to sit up. Pt transported to CT on CCM by TRN and primary RN, spinal immobilization maintained. Notified by EMS multiple family members will be coming to the ED.   MTP Summary (If applicable): N/A  Bedside handoff with ED RN Tiffany.    Misha Antonini Dee  Trauma Response RN  Please call TRN at [phone] for further assistance.

## 2023-12-17 ENCOUNTER — Telehealth: Payer: Self-pay | Admitting: Internal Medicine

## 2023-12-17 NOTE — Telephone Encounter (Signed)
 Patient confirmed appointment for 12/19/2023, through volunteer call.

## 2023-12-18 ENCOUNTER — Ambulatory Visit (HOSPITAL_COMMUNITY): Admission: EM | Admit: 2023-12-18 | Discharge: 2023-12-18 | Disposition: A | Discharge status: Home / Self Care

## 2023-12-18 ENCOUNTER — Encounter (HOSPITAL_COMMUNITY): Payer: Self-pay

## 2023-12-18 DIAGNOSIS — Z5189 Encounter for other specified aftercare: Secondary | ICD-10-CM | POA: Diagnosis not present

## 2023-12-18 NOTE — ED Provider Notes (Signed)
 [facility]    CSN: [id number] Arrival date & time: 12/18/23  1257      History   Chief Complaint Chief Complaint  Patient presents with   Suture / Staple Removal    Placed in ED   Menorrhagia    HPI Bonnie Conrad is a 53 y.o. female.   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   The patient presents for removal of stitches following a recent fall resulting in a head injury. The patient sustained a laceration over her left eyebrow that required sutures in the emergency department on 12/08/23. The patient reports ongoing tenderness in the forehead area where she hit her head. A CT scan of the head, face, and neck was performed in the emergency department, which was negative for any acute abnormality outside of the laceration.    The following portions of the patient's history were reviewed and updated as appropriate: allergies, current medications, past family history, past medical history, past social history, past surgical history, and problem list.       Past Medical History:  Diagnosis Date   Abdominal hernia    Anemia    Anxiety    Arthritis    Blood transfusion without reported diagnosis    Depression    [facility]   Hernia of abdominal cavity 2009   Ovarian cyst     Patient Active Problem List   Diagnosis Date Noted   MDD (major depressive disorder), severe (HCC) 12/08/2023   Fibrocystic breast changes 05/03/2023   Hiatal hernia 08/15/2018   History of hernia repair 08/15/2018   Pain at surgical site 08/15/2018   Iron deficiency anemia 06/02/2018   Dysphagia 06/02/2018   History of colonic polyps 06/02/2018   Ventral hernia without obstruction or gangrene 06/02/2018   Abnormal CT of the abdomen 03/09/2018   Non-intractable vomiting 03/09/2018   Chronic generalized  abdominal pain 03/09/2018   Gastroesophageal reflux disease 03/09/2018   Rectal bleeding 03/09/2018   Hemorrhoids 03/09/2018   Hernia of abdominal cavity 03/06/2011    Past Surgical History:  Procedure Laterality Date   BREAST BIOPSY Right 05/01/2023   MM RT BREAST BX W LOC DEV 1ST LESION IMAGE BX SPEC STEREO GUIDE 05/01/2023 [facility]   BREAST BIOPSY Left 05/01/2023   MM LT BREAST BX W LOC DEV 1ST LESION IMAGE BX SPEC STEREO GUIDE 05/01/2023 [facility]   COLONOSCOPY WITH ESOPHAGOGASTRODUODENOSCOPY (EGD)     DILATION AND CURETTAGE OF UTERUS     EPIGASTRIC HERNIA REPAIR N/A 06/05/2018   Procedure: OPEN REPAIR OF INCARCERATED EPIGASTRIC HERNIA WITH MESH;  Surgeon: Signe Mitzie LABOR, MD;  Location: [facility];  Service: General;  Laterality: N/A;   ESOPHAGEAL MANOMETRY N/A 06/29/2018   Procedure: ESOPHAGEAL MANOMETRY (EM);  Surgeon: Shila Gustav GAILS, MD;  Location: [facility];  Service: Endoscopy;  Laterality: N/A;   TUBAL LIGATION  07/25/2011   Procedure: POST PARTUM TUBAL LIGATION;  Surgeon: Winton Felt, MD;  Location: [facility];  Service: Gynecology;  Laterality: Bilateral;    OB History     Gravida  9   Para  6   Term  6   Preterm  0   AB  3   Living  6      SAB  3   IAB  0   Ectopic  0  Multiple  0   Live Births  6            Home Medications    Prior to Admission medications   Medication Sig Start Date End Date Taking? Authorizing Provider  omeprazole  (PRILOSEC) 20 MG capsule Take 1 capsule (20 mg total) by mouth daily. 08/18/23  Yes Vicci Barnie NOVAK, MD  PARoxetine  (PAXIL ) 10 MG tablet TAKE 1 TABLET(10 MG) BY MOUTH AT BEDTIME 11/05/23  Yes Vicci Barnie NOVAK, MD  cyclobenzaprine  (FLEXERIL ) 5 MG tablet Take 1 tablet (5 mg total) by mouth daily as needed for muscle spasms. 08/18/23   Vicci Barnie NOVAK, MD  ibuprofen  (ADVIL ) 600 MG tablet Take 1 tablet (600 mg total) by mouth every 8 (eight) hours as needed for moderate pain (pain score 4-6).  Take with food 02/18/23   Vicci Barnie NOVAK, MD  nicotine  (NICODERM CQ  - DOSED IN MG/24 HOURS) 14 mg/24hr patch Place 1 patch (14 mg total) onto the skin daily. 08/18/23   Vicci Barnie NOVAK, MD  nicotine  polacrilex (NICORETTE ) 2 MG gum Take 1 each (2 mg total) by mouth as needed for smoking cessation. 08/18/23   Vicci Barnie NOVAK, MD    Family History Family History  Problem Relation Age of Onset   Hypertension Mother    Anesthesia problems Neg Hx    Hypotension Neg Hx    Malignant hyperthermia Neg Hx    Pseudochol deficiency Neg Hx    Colon cancer Neg Hx    Esophageal cancer Neg Hx    Inflammatory bowel disease Neg Hx    Liver disease Neg Hx    Pancreatic cancer Neg Hx    Rectal cancer Neg Hx    Stomach cancer Neg Hx    Colon polyps Neg Hx    BRCA 1/2 Neg Hx    Breast cancer Neg Hx     Social History Social History   Tobacco Use   Smoking status: Some Days    Types: Cigarettes   Smokeless tobacco: Never  Vaping Use   Vaping status: Never Used  Substance Use Topics   Alcohol use: No   Drug use: No     Allergies   Penicillins, Vicodin [hydrocodone -acetaminophen ], and Morphine  and codeine   Review of Systems Review of Systems  Skin:  Positive for wound.  All other systems reviewed and are negative.    Physical Exam Triage Vital Signs ED Triage Vitals  Encounter Vitals Group     BP 12/18/23 1333 131/74     Girls Systolic BP Percentile --      Girls Diastolic BP Percentile --      Boys Systolic BP Percentile --      Boys Diastolic BP Percentile --      Pulse Rate 12/18/23 1333 68     Resp 12/18/23 1333 16     Temp 12/18/23 1333 98.4 F (36.9 C)     Temp Source 12/18/23 1333 Oral     SpO2 12/18/23 1333 97 %     Weight 12/18/23 1333 108 lb (49 kg)     Height 12/18/23 1333 5' 1 (1.549 m)     Head Circumference --      Peak Flow --      Pain Score 12/18/23 1332 6     Pain Loc --      Pain Education --      Exclude from Growth Chart --    No data  found.  Updated Vital Signs BP 131/74 (BP  Location: Right Arm)   Pulse 68   Temp 98.4 F (36.9 C) (Oral)   Resp 16   Ht 5' 1 (1.549 m)   Wt 108 lb (49 kg)   LMP 11/27/2023 (Approximate)   SpO2 97%   BMI 20.41 kg/m   Visual Acuity Right Eye Distance:   Left Eye Distance:   Bilateral Distance:    Right Eye Near:   Left Eye Near:    Bilateral Near:     Physical Exam Vitals reviewed.  Constitutional:      General: She is awake. She is not in acute distress.    Appearance: Normal appearance. She is well-developed. She is not ill-appearing, toxic-appearing or diaphoretic.  HENT:     Head: Normocephalic.     Right Ear: Hearing normal.     Left Ear: Hearing normal.     Nose: Nose normal.     Mouth/Throat:     Mouth: Mucous membranes are moist.  Eyes:     General: Vision grossly intact.     Conjunctiva/sclera: Conjunctivae normal.  Cardiovascular:     Rate and Rhythm: Normal rate and regular rhythm.     Heart sounds: Normal heart sounds.  Pulmonary:     Effort: Pulmonary effort is normal.     Breath sounds: Normal breath sounds and air entry.  Musculoskeletal:        General: Normal range of motion.     Cervical back: Full passive range of motion without pain, normal range of motion and neck supple.  Skin:    General: Skin is warm and dry.     Comments: Healed laceration noted within the left eyebrow without any redness, swelling or drainage.   Neurological:     General: No focal deficit present.     Mental Status: She is alert and oriented to person, place, and time.  Psychiatric:        Speech: Speech normal.        Behavior: Behavior is cooperative.      UC Treatments / Results  Labs (all labs ordered are listed, but only abnormal results are displayed) Labs Reviewed - No data to display  EKG   Radiology No results found.  Procedures Procedures (including critical care time)  Medications Ordered in UC Medications - No data to display  Initial  Impression / Assessment and Plan / UC Course  I have reviewed the triage vital signs and the nursing notes.  Pertinent labs & imaging results that were available during my care of the patient were reviewed by me and considered in my medical decision making (see chart for details).    The patient presents for suture removal following a laceration over the left eyebrow sustained after a fall on 10/08/2023, initially treated and repaired in the ER. On examination, the wound is well-healed without redness, swelling, or drainage, and no sutures are visible. Review of the ER note indicates that absorbable sutures were used, so no removal is required. The patient was advised to apply ice to the area as needed for tenderness and to continue ibuprofen  as prescribed for pain relief. Follow-up with the primary care provider was recommended for any concerns regarding wound healing, and the patient was instructed to seek urgent or emergency care if signs of infection such as redness, swelling, drainage, or fever develop.  Today's evaluation has revealed no signs of a dangerous process. Discussed diagnosis with patient and/or guardian. Patient and/or guardian aware of their diagnosis, possible red flag symptoms to  watch out for and need for close follow up. Patient and/or guardian understands verbal and written discharge instructions. Patient and/or guardian comfortable with plan and disposition.  Patient and/or guardian has a clear mental status at this time, good insight into illness (after discussion and teaching) and has clear judgment to make decisions regarding their care  Documentation was completed with the aid of voice recognition software. Transcription may contain typographical errors.  Final Clinical Impressions(s) / UC Diagnoses   Final diagnoses:  Visit for wound check     Discharge Instructions      Usted fue atendido hoy para la retirada de puntos de su laceracin en la ceja izquierda que  ocurri el 25/09/2023. En el examen, su herida est completamente cicatrizada sin enrojecimiento, hinchazn ni supuracin. No se observaron suturas visibles y la nota de la sala de emergencias confirma que se usaron puntos absorbibles, por lo que no hay suturas que Oceanographer.  En este momento, no se necesita ms tratamiento aparte de las medidas de comodidad. Si el rea se siente sensible, puede aplicar compresas de hielo durante 10 a 15 minutos a la vez para ayudar con la molestia. Contine tomando ibuprofeno segn lo recetado para el dolor si lo necesita. Mantenga el rea limpia y lvela suavemente con agua y [place] territory. Puede usar una capa delgada de vaselina o una crema de cicatrices de venta libre para mantener la piel hidratada mientras contina cicatrizando.  Haga un seguimiento con su mdico de cabecera si tiene alguna inquietud sobre cmo est sanando la herida o si desea hablar sobre opciones para el cuidado de la Training and development officer. Acuda a la sala de emergencias si nota enrojecimiento nuevo o que empeora, hinchazn, pus o secrecin de la herida, fiebre o si la herida se vuelve a abrir.  You were seen today for suture removal from your left eyebrow laceration that occurred on 10/08/2023. On exam, your wound is fully healed with no redness, swelling, or drainage. No stitches were visible, and review of your ER note confirms that absorbable sutures were used, so there are no stitches to remove. At this point, no further treatment is needed other than comfort care. If the area feels sore, you may apply ice packs for 10-15 minutes at a time to help with tenderness. Continue taking ibuprofen  as prescribed for pain if needed. Keep the area clean and gently wash with mild soap and water. You may use a thin layer of petroleum jelly or an over-the-counter scar cream to keep the skin moisturized as it continues to heal. Follow up with your primary care provider if you have any concerns about how the wound is healing or  if you want to discuss scar care options. Seek emergency care if you notice new or worsening redness, swelling, pus or drainage from the wound, fever, or if the wound reopens.       ED Prescriptions   None    PDMP not reviewed this encounter.   Iola Lukes, [place] 12/18/23 [phone]

## 2023-12-18 NOTE — ED Triage Notes (Addendum)
 Interpreter: Maceo Terry Saunas [id number]  Patient presenting for suture removal from laceration of the left eyebrow. States they were placed on 12/08/23.   Patient states she continues to have pounding, numbness, and tingling pain in the left eyebrow.   States she has also been on her menstrual cycle for the last 3 weeks straight with a heavy flow.

## 2023-12-18 NOTE — Discharge Instructions (Addendum)
 Usted fue atendido hoy para la retirada de puntos de su laceracin en la ceja izquierda que ocurri el 25/09/2023. En el examen, su herida est completamente cicatrizada sin enrojecimiento, hinchazn ni supuracin. No se observaron suturas visibles y la nota de la sala de emergencias confirma que se usaron puntos absorbibles, por lo que no hay suturas que Oceanographer.  En este momento, no se necesita ms tratamiento aparte de las medidas de comodidad. Si el rea se siente sensible, puede aplicar compresas de hielo durante 10 a 15 minutos a la vez para ayudar con la molestia. Contine tomando ibuprofeno segn lo recetado para el dolor si lo necesita. Mantenga el rea limpia y lvela suavemente con agua y [place] territory. Puede usar una capa delgada de vaselina o una crema de cicatrices de venta libre para mantener la piel hidratada mientras contina cicatrizando.  Haga un seguimiento con su mdico de cabecera si tiene alguna inquietud sobre cmo est sanando la herida o si desea hablar sobre opciones para el cuidado de la Training and development officer. Acuda a la sala de emergencias si nota enrojecimiento nuevo o que empeora, hinchazn, pus o secrecin de la herida, fiebre o si la herida se vuelve a abrir.  You were seen today for suture removal from your left eyebrow laceration that occurred on 10/08/2023. On exam, your wound is fully healed with no redness, swelling, or drainage. No stitches were visible, and review of your ER note confirms that absorbable sutures were used, so there are no stitches to remove. At this point, no further treatment is needed other than comfort care. If the area feels sore, you may apply ice packs for 10-15 minutes at a time to help with tenderness. Continue taking ibuprofen  as prescribed for pain if needed. Keep the area clean and gently wash with mild soap and water. You may use a thin layer of petroleum jelly or an over-the-counter scar cream to keep the skin moisturized as it continues to heal. Follow up  with your primary care provider if you have any concerns about how the wound is healing or if you want to discuss scar care options. Seek emergency care if you notice new or worsening redness, swelling, pus or drainage from the wound, fever, or if the wound reopens.

## 2023-12-18 NOTE — ED Notes (Signed)
 When going to remove sutures this CMA cleaned the area. Once are cleaned and no scabs covering the suture area, this CMA did not see any suture material to remove. Provider made aware.   According to note from Patient's 12/08/23 visit:  Date/Time: 12/08/2023 5:42 AM   Performed by: Jerral Meth, MD Authorized by: Jerral Meth, MD   Consent:    Consent obtained:  Verbal   Consent given by:  Patient   Risks, benefits, and alternatives were discussed: yes   Laceration details:    Length (cm):  9 Treatment:    Amount of cleaning:  Extensive Skin repair:    Repair method:  Sutures   Suture material:  Fast-absorbing gut   Suture technique:  Simple interrupted   Number of sutures:  7 Repair type:    Repair type:  Intermediate Post-procedure details:    Procedure completion:  Tolerated

## 2023-12-19 ENCOUNTER — Ambulatory Visit: Attending: Internal Medicine | Admitting: Internal Medicine

## 2023-12-19 ENCOUNTER — Encounter: Payer: Self-pay | Admitting: Internal Medicine

## 2023-12-19 VITALS — BP 111/67 | HR 64 | Temp 98.3°F | Ht 61.0 in | Wt 110.0 lb

## 2023-12-19 DIAGNOSIS — F17200 Nicotine dependence, unspecified, uncomplicated: Secondary | ICD-10-CM | POA: Diagnosis not present

## 2023-12-19 DIAGNOSIS — Z23 Encounter for immunization: Secondary | ICD-10-CM

## 2023-12-19 DIAGNOSIS — F418 Other specified anxiety disorders: Secondary | ICD-10-CM | POA: Diagnosis not present

## 2023-12-19 DIAGNOSIS — F419 Anxiety disorder, unspecified: Secondary | ICD-10-CM

## 2023-12-19 DIAGNOSIS — Z6379 Other stressful life events affecting family and household: Secondary | ICD-10-CM | POA: Diagnosis not present

## 2023-12-19 DIAGNOSIS — N951 Menopausal and female climacteric states: Secondary | ICD-10-CM | POA: Diagnosis not present

## 2023-12-19 MED ORDER — PAROXETINE HCL 20 MG PO TABS: 20.0000 mg | ORAL_TABLET | Freq: Every day | ORAL | 2 refills | Status: DC

## 2023-12-19 NOTE — Progress Notes (Signed)
 Patient ID: Bonnie Conrad, female    DOB: 1970/11/12  MRN: [id number]  CC: Follow-up (Follow-up. Med refills. /Nicotine  gun & patch needs a PA)   Subjective: Bonnie Conrad is a 53 y.o. female who presents for chronic ds management. Her concerns today include:  Patient with history of tobacco dependence, migraines, MDD/hot flashes (Paxil ), CTS   AMN Language interpreter used during this encounter. #[id number] Diego Tita   Discussed the use of AI scribe software for clinical note transcription with the patient, who gave verbal consent to proceed.  History of Present Illness Bonnie Conrad is a 53 year old female who presents for a routine follow-up visit.  She has been unable to obtain the nicotine  patches due to insurance issues and continues to smoke four to five cigarettes daily.  She recently visited the emergency room on August 25th after a fall at home while intoxicated. She drinks alcohol infrequently, about once a month, consuming two michelada beers, each approximately 16 ounces. Her last alcohol consumption was on August 25th, when she drank two michelada beers. She attributes her drinking episode to feelings of depression and anxiety.  Her depression and anxiety are related to work stress, financial concerns, and worries about her daughters. One daughter is working in [place], which causes her concern, while her 56 year old daughter at home is described as rebellious and demanding, contributing to her stress. She works part-time, three days a week, and expresses financial stress due to insufficient income. No thoughts of self-harm and no history of heavy drinking in the past.  She is currently taking Paxil  10 mg that was prescribed on last visit for mood swings at hot flashes and finds it helpful for her depression and anxiety. She finds it helpful but feels a higher dose might be beneficial. She has about five or six pills left in her current  bottle. She also takes a medication at night that helps her sleep well, though she does not recall the name. I think she is referring to Flexeril  that was prescribe for MSK issues      Patient Active Problem List   Diagnosis Date Noted   MDD (major depressive disorder), severe (HCC) 12/08/2023   Fibrocystic breast changes 05/03/2023   Hiatal hernia 08/15/2018   History of hernia repair 08/15/2018   Pain at surgical site 08/15/2018   Iron deficiency anemia 06/02/2018   Dysphagia 06/02/2018   History of colonic polyps 06/02/2018   Ventral hernia without obstruction or gangrene 06/02/2018   Abnormal CT of the abdomen 03/09/2018   Non-intractable vomiting 03/09/2018   Chronic generalized abdominal pain 03/09/2018   Gastroesophageal reflux disease 03/09/2018   Rectal bleeding 03/09/2018   Hemorrhoids 03/09/2018   Hernia of abdominal cavity 03/06/2011     Current Outpatient Medications on File Prior to Visit  Medication Sig Dispense Refill   cyclobenzaprine  (FLEXERIL ) 5 MG tablet Take 1 tablet (5 mg total) by mouth daily as needed for muscle spasms. 30 tablet 1   ibuprofen  (ADVIL ) 600 MG tablet Take 1 tablet (600 mg total) by mouth every 8 (eight) hours as needed for moderate pain (pain score 4-6). Take with food 60 tablet 0   omeprazole  (PRILOSEC) 20 MG capsule Take 1 capsule (20 mg total) by mouth daily. 30 capsule 3   PARoxetine  (PAXIL ) 10 MG tablet TAKE 1 TABLET(10 MG) BY MOUTH AT BEDTIME 30 tablet 3   nicotine  (NICODERM CQ  - DOSED IN MG/24 HOURS) 14 mg/24hr patch Place 1 patch (  14 mg total) onto the skin daily. (Patient not taking: Reported on 12/19/2023) 28 patch 1   nicotine  polacrilex (NICORETTE ) 2 MG gum Take 1 each (2 mg total) by mouth as needed for smoking cessation. (Patient not taking: Reported on 12/19/2023) 100 tablet 0   No current facility-administered medications on file prior to visit.    Allergies  Allergen Reactions   Penicillins Swelling    Did it involve  swelling of the face/tongue/throat, SOB, or low BP? Yes Did it involve sudden or severe rash/hives, skin peeling, or any reaction on the inside of your mouth or nose? No Did you need to seek medical attention at a hospital or doctor's office? No When did it last happen?  1 or 2 years ago     If all above answers are NO, may proceed with cephalosporin use.    Vicodin [Hydrocodone -Acetaminophen ] Hives and Itching   Morphine  And Codeine Rash    Nervous,itching    Social History   Socioeconomic History   Marital status: Married    Spouse name: Not on file   Number of children: Not on file   Years of education: Not on file   Highest education level: Not on file  Occupational History   Occupation: part time housekeeper   Tobacco Use   Smoking status: Some Days    Types: Cigarettes   Smokeless tobacco: Never  Vaping Use   Vaping status: Never Used  Substance and Sexual Activity   Alcohol use: No   Drug use: No   Sexual activity: Yes    Birth control/protection: None    Comment: tubal  Other Topics Concern   Not on file  Social History Narrative   Not on file   Social Drivers of Health   Financial Resource Strain: Low Risk  (02/18/2023)   Overall Financial Resource Strain (CARDIA)    Difficulty of Paying Living Expenses: Not hard at all  Food Insecurity: No Food Insecurity (02/18/2023)   Hunger Vital Sign    Worried About Running Out of Food in the Last Year: Never true    Ran Out of Food in the Last Year: Never true  Transportation Needs: No Transportation Needs (02/18/2023)   PRAPARE - Administrator, Civil Service (Medical): No    Lack of Transportation (Non-Medical): No  Physical Activity: Inactive (02/18/2023)   Exercise Vital Sign    Days of Exercise per Week: 0 days    Minutes of Exercise per Session: 0 min  Stress: No Stress Concern Present (02/18/2023)   [employer] of Occupational Health - Occupational Stress Questionnaire    Feeling of Stress  : Not at all  Social Connections: Socially Isolated (02/18/2023)   Social Connection and Isolation Panel    Frequency of Communication with Friends and Family: More than three times a week    Frequency of Social Gatherings with Friends and Family: Once a week    Attends Religious Services: Never    Database administrator or Organizations: No    Attends Banker Meetings: Never    Marital Status: Separated  Intimate Partner Violence: Not At Risk (02/18/2023)   Humiliation, Afraid, Rape, and Kick questionnaire    Fear of Current or Ex-Partner: No    Emotionally Abused: No    Physically Abused: No    Sexually Abused: No    Family History  Problem Relation Age of Onset   Hypertension Mother    Anesthesia problems Neg Hx  Hypotension Neg Hx    Malignant hyperthermia Neg Hx    Pseudochol deficiency Neg Hx    Colon cancer Neg Hx    Esophageal cancer Neg Hx    Inflammatory bowel disease Neg Hx    Liver disease Neg Hx    Pancreatic cancer Neg Hx    Rectal cancer Neg Hx    Stomach cancer Neg Hx    Colon polyps Neg Hx    BRCA 1/2 Neg Hx    Breast cancer Neg Hx     Past Surgical History:  Procedure Laterality Date   BREAST BIOPSY Right 05/01/2023   MM RT BREAST BX W LOC DEV 1ST LESION IMAGE BX SPEC STEREO GUIDE 05/01/2023 [facility]   BREAST BIOPSY Left 05/01/2023   MM LT BREAST BX W LOC DEV 1ST LESION IMAGE BX SPEC STEREO GUIDE 05/01/2023 [facility]   COLONOSCOPY WITH ESOPHAGOGASTRODUODENOSCOPY (EGD)     DILATION AND CURETTAGE OF UTERUS     EPIGASTRIC HERNIA REPAIR N/A 06/05/2018   Procedure: OPEN REPAIR OF INCARCERATED EPIGASTRIC HERNIA WITH MESH;  Surgeon: Signe Mitzie LABOR, MD;  Location: [facility];  Service: General;  Laterality: N/A;   ESOPHAGEAL MANOMETRY N/A 06/29/2018   Procedure: ESOPHAGEAL MANOMETRY (EM);  Surgeon: Shila Gustav GAILS, MD;  Location: [facility];  Service: Endoscopy;  Laterality: N/A;   TUBAL LIGATION  07/25/2011   Procedure: POST  PARTUM TUBAL LIGATION;  Surgeon: Winton Felt, MD;  Location: [facility];  Service: Gynecology;  Laterality: Bilateral;    ROS: Review of Systems Negative except as stated above  PHYSICAL EXAM: BP 111/67 (BP Location: Left Arm, Patient Position: Sitting, Cuff Size: Normal)   Pulse 64   Temp 98.3 F (36.8 C) (Oral)   Ht 5' 1 (1.549 m)   Wt 110 lb (49.9 kg)   LMP 11/27/2023 (Approximate)   SpO2 97%   BMI 20.78 kg/m   Physical Exam  General appearance - alert, well appearing, middle-age Hispanic female and in no distress Mental status -patient with quiet demeanor and flat affect.      Latest Ref Rng & Units 12/08/2023    1:00 AM 12/08/2023   12:52 AM 05/17/2021   11:37 AM  CMP  Glucose 70 - 99 mg/dL 899  898  80   BUN 6 - 20 mg/dL 10  10  8    Creatinine 0.44 - 1.00 mg/dL 9.09  9.31  9.27   Sodium 135 - 145 mmol/L 143  140  140   Potassium 3.5 - 5.1 mmol/L 3.5  3.5  4.0   Chloride 98 - 111 mmol/L 108  109  102   CO2 22 - 32 mmol/L  18  22   Calcium  8.9 - 10.3 mg/dL  9.1  9.7   Total Protein 6.5 - 8.1 g/dL  7.0  7.5   Total Bilirubin 0.0 - 1.2 mg/dL  0.7  0.6   Alkaline Phos 38 - 126 U/L  60  72   AST 15 - 41 U/L  24  17   ALT 0 - 44 U/L  18  14    Lipid Panel  No results found for: CHOL, TRIG, HDL, CHOLHDL, VLDL, LDLCALC, LDLDIRECT  CBC    Component Value Date/Time   WBC 11.6 (H) 12/08/2023 0052   RBC 4.16 12/08/2023 0052   HGB 13.6 12/08/2023 0100   HGB 13.0 05/17/2021 1137   HCT 40.0 12/08/2023 0100   HCT 40.2 05/17/2021 1137   PLT 247 12/08/2023 0052  PLT 261 05/17/2021 1137   MCV 92.8 12/08/2023 0052   MCV 93 05/17/2021 1137   MCH 31.0 12/08/2023 0052   MCHC 33.4 12/08/2023 0052   RDW 15.0 12/08/2023 0052   RDW 14.0 05/17/2021 1137   LYMPHSABS 2.6 05/17/2021 1137   MONOABS 0.6 06/03/2018 1417   EOSABS 0.1 05/17/2021 1137   BASOSABS 0.0 05/17/2021 1137    ASSESSMENT AND PLAN: 1. Tobacco dependence (Primary) Advised patient to call [phone]  quit NOW to request the nicotine  patches and gum for free.  I wrote down this information for her.  2. Anxiety and depression Patient with depression and anxiety due to life stresses.  Strongly advised against excessive alcohol use.  Over with her how much is too much else to drink in 1 setting.  She feels she would benefit from a higher dose of the Paxil  so we will increase it to 20 mg daily.  She is also agreeable to referral to behavioral health. - PARoxetine  (PAXIL ) 20 MG tablet; Take 1 tablet (20 mg total) by mouth daily. Dose increase  Dispense: 30 tablet; Refill: 2 - Ambulatory referral to Psychiatry  3. Stressful life event affecting family See #2 above - Ambulatory referral to Psychiatry  4. Perimenopausal symptoms On Paxil   5. Need for influenza vaccination Flu shot given today.  Patient was given the opportunity to ask questions.  Patient verbalized understanding of the plan and was able to repeat key elements of the plan.   This documentation was completed using Paediatric nurse.  Any transcriptional errors are unintentional.  No orders of the defined types were placed in this encounter.    Requested Prescriptions   Pending Prescriptions Disp Refills   cyclobenzaprine  (FLEXERIL ) 5 MG tablet 90 tablet 1    Sig: Take 1 tablet (5 mg total) by mouth daily as needed for muscle spasms.   PARoxetine  (PAXIL ) 10 MG tablet 90 tablet 1    Sig: 1 tablet (10 mg total).   nicotine  (NICODERM CQ  - DOSED IN MG/24 HOURS) 14 mg/24hr patch 28 patch 1    Sig: Place 1 patch (14 mg total) onto the skin daily.   nicotine  polacrilex (NICORETTE ) 2 MG gum 100 tablet 0    Sig: Take 1 each (2 mg total) by mouth as needed for smoking cessation.    No follow-ups on file.  Barnie Louder, MD, FACP

## 2024-03-02 ENCOUNTER — Ambulatory Visit (INDEPENDENT_AMBULATORY_CARE_PROVIDER_SITE_OTHER): Payer: Self-pay | Admitting: Clinical

## 2024-03-02 ENCOUNTER — Encounter (HOSPITAL_COMMUNITY): Payer: Self-pay

## 2024-03-02 DIAGNOSIS — F331 Major depressive disorder, recurrent, moderate: Secondary | ICD-10-CM
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
## 2024-03-23 ENCOUNTER — Encounter (HOSPITAL_COMMUNITY): Payer: Self-pay | Admitting: Student in an Organized Health Care Education/Training Program

## 2024-03-30 ENCOUNTER — Ambulatory Visit (INDEPENDENT_AMBULATORY_CARE_PROVIDER_SITE_OTHER): Admitting: Clinical

## 2024-03-30 DIAGNOSIS — F331 Major depressive disorder, recurrent, moderate: Secondary | ICD-10-CM
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
## 2024-04-05 NOTE — Progress Notes (Signed)
 This encounter was created in error - please disregard.

## 2024-04-12 ENCOUNTER — Ambulatory Visit (HOSPITAL_COMMUNITY): Admitting: Clinical

## 2024-04-12 DIAGNOSIS — F331 Major depressive disorder, recurrent, moderate: Secondary | ICD-10-CM

## 2024-04-12 NOTE — Progress Notes (Signed)
" ° °  THERAPIST PROGRESS NOTE  Session Time: 30 min  Participation Level: Active  Behavioral Response: CasualAlertAnxious  Type of Therapy: Individual Therapy  Treatment Goals addressed: client will engage in at least 80% of scheduled individual psychotherapy sessions  ProgressTowards Goals: Progressing  Interventions: CBT and Supportive  Summary:  Zarielle Cea is a 53 y.o. female who presents for scheduled appointment oriented x 5, appropriately dressed, and cooperative.  Client denied hallucinations and delusions.  Client presented with a in person Spanish interpreter.  Client reported on today she has been doing fairly okay.  Client reported her daughter left for a week, [place]  with her dad but has come back.  Client reported her 79 year old daughter has had sustained behaviors as reported before.  Client reported her daughter was ungrateful about the presence that she had gotten for her.  Client reported her daughter also constantly bothers her younger daughter who is 73.  Client reported she had concerns about her younger daughter because she has expressed that she feels sad and anxious.  Client reported she is going to look into getting a therapist for her younger daughter.  Client reported she has not yet reached out to her 68 year old's father yet to discuss her concerns but she will.  Client reported she is even open minded to having her to live with him for a while.  Client reported she does not abide by any rules and is disrespectful. Evidence of progress towards goal:  client reported 1 positive of communicating and brainstorming her thoughts and concerns and what was all she would like to see and interpersonal relationships.   Suicidal/Homicidal: Nowithout intent/plan  Therapist Response:  Therapist began the appointment asking the client how she has been doing since last seen. Therapist engaged with active listening and positive emotional support. Therapist used  CBT to give the client time to discuss her thoughts and emotions pertaining to family stress. Therapist used CBT to validate her emotions within reason and collaborate with her to brainstorm how she can communicate her concerns with the appropriate person and outcomes that she would like to discuss with them. Therapist used CBT ask the client to identify her progress with frequency of use with coping skills with continued practice in her daily activity.    Therapist assigned client homework to practice self-care.   Plan: Return again in 3 weeks.  Diagnosis: Major depressive disorder, recurrent, moderate  Collaboration of Care: Patient refused AEB none requested.  Patient/Guardian was advised Release of Information must be obtained prior to any record release in order to collaborate their care with an outside provider. Patient/Guardian was advised if they have not already done so to contact the registration department to sign all necessary forms in order for us  to release information regarding their care.   Consent: Patient/Guardian gives verbal consent for treatment and assignment of benefits for services provided during this visit. Patient/Guardian expressed understanding and agreed to proceed.   Aleza Pew Y Eutimio Gharibian, LCSW 04/12/2024  "

## 2024-04-19 ENCOUNTER — Other Ambulatory Visit (HOSPITAL_COMMUNITY)
Admission: RE | Admit: 2024-04-19 | Discharge: 2024-04-19 | Disposition: A | Discharge status: Home / Self Care | Source: Ambulatory Visit | Attending: Internal Medicine | Admitting: Internal Medicine

## 2024-04-19 ENCOUNTER — Ambulatory Visit: Attending: Internal Medicine | Admitting: Internal Medicine

## 2024-04-19 ENCOUNTER — Encounter: Payer: Self-pay | Admitting: Internal Medicine

## 2024-04-19 VITALS — BP 116/69 | HR 68 | Ht 61.0 in | Wt 113.0 lb

## 2024-04-19 DIAGNOSIS — N924 Excessive bleeding in the premenopausal period: Secondary | ICD-10-CM

## 2024-04-19 DIAGNOSIS — Z3202 Encounter for pregnancy test, result negative: Secondary | ICD-10-CM | POA: Diagnosis not present

## 2024-04-19 DIAGNOSIS — R1030 Lower abdominal pain, unspecified: Secondary | ICD-10-CM | POA: Diagnosis not present

## 2024-04-19 DIAGNOSIS — Z23 Encounter for immunization: Secondary | ICD-10-CM

## 2024-04-19 DIAGNOSIS — R42 Dizziness and giddiness: Secondary | ICD-10-CM | POA: Diagnosis not present

## 2024-04-19 DIAGNOSIS — F419 Anxiety disorder, unspecified: Secondary | ICD-10-CM | POA: Diagnosis not present

## 2024-04-19 DIAGNOSIS — F330 Major depressive disorder, recurrent, mild: Secondary | ICD-10-CM

## 2024-04-19 DIAGNOSIS — R3129 Other microscopic hematuria: Secondary | ICD-10-CM | POA: Diagnosis not present

## 2024-04-19 DIAGNOSIS — Z124 Encounter for screening for malignant neoplasm of cervix: Secondary | ICD-10-CM | POA: Diagnosis present

## 2024-04-19 LAB — POCT URINALYSIS DIP (CLINITEK)
Bilirubin, UA: NEGATIVE
Glucose, UA: NEGATIVE mg/dL
Ketones, POC UA: NEGATIVE mg/dL
Leukocytes, UA: NEGATIVE
Nitrite, UA: NEGATIVE
POC PROTEIN,UA: NEGATIVE
Spec Grav, UA: >=1.03 — AB
Urobilinogen, UA: 0.2 U/dL
pH, UA: 6

## 2024-04-19 LAB — POCT URINE PREGNANCY: Preg Test, Ur: NEGATIVE

## 2024-04-19 MED ORDER — PAROXETINE HCL 10 MG PO TABS: 10.0000 mg | ORAL_TABLET | Freq: Every day | ORAL | 5 refills | Status: AC

## 2024-04-19 NOTE — Progress Notes (Signed)
 "   Patient ID: Bonnie Conrad, female    DOB: 1970-08-01  MRN: [id number]  CC: Follow-up (Follow-up. /LT ovary pain - difficulty squatting /Dizziness, loss of balance, On-going fatigue, restless / agitated X1 mo/Concern of vaginal bleeding, chills, chills / fever in Dec 2025/Yes to pap for another appt. Flu vax administered on 04/20/23 - C.A.)   Subjective: Bonnie Conrad is a 54 y.o. female who presents for chronic ds management. Her concerns today include:  Patient with history of tobacco dependence, migraines, MDD/hot flashes (Paxil ), CTS   [employer] Language interpreter used during this encounter. #Randy [id number], Filiberto [id number]   Discussed the use of AI scribe software for clinical note transcription with the patient, who gave verbal consent to proceed.  History of Present Illness Bonnie Conrad is a 54 year old female who presents with abdominal pain and abnormal vaginal bleeding.  She has been experiencing abdominal pain for the past two to three weeks, located across the lower abdomen with LT>RT. The pain is intermittent, lasting about fifteen to twenty minutes when she is sitting, and worsens with food intake, causing a sensation of food 'coming up'. She also experiences nausea but no vomiting, diarrhea, or urinary symptoms.  Bowel movements have been regular with no blood in the stools.  Denies any abnormal vaginal dischg. The pain intensifies during and after intercourse, lasting about twenty minutes while lying down.  She is concerned about recent abnormal vaginal bleeding. Her menstrual cycles were regular until October and November when she did not have a period. Menses usually last 5-6 days with mild-moderate bleeding. In December, she experienced an unusually heavy menstrual cycle lasting about two weeks, with the first few days being particularly heavy with clots, akin to a hemorrhage, requiring frequent pad changes. She also experienced headaches,  sweating, fever, chills, and dizziness during this time. The bleeding eventually tapered off in the second week, but dizziness persists despite the cessation of bleeding. She has had tubal ligation in past.  MDD/GAD: She has a history of anxiety and depression, previously managed with Paxil  which was increased to 20 mg daily on last visit 4 months ago.  She has been out of the medication for 1 to 2 months but feels that she needs to be back on it.  Reports no increased dizziness upon discontinuation. Shehas started seeing an LCSW for counseling sessions. she does not recall the counselor's name. No thoughts of self-harm or harm to others.      Patient Active Problem List   Diagnosis Date Noted   MDD (major depressive disorder), severe (HCC) 12/08/2023   Fibrocystic breast changes 05/03/2023   Hiatal hernia 08/15/2018   History of hernia repair 08/15/2018   Pain at surgical site 08/15/2018   Iron deficiency anemia 06/02/2018   Dysphagia 06/02/2018   History of colonic polyps 06/02/2018   Ventral hernia without obstruction or gangrene 06/02/2018   Abnormal CT of the abdomen 03/09/2018   Non-intractable vomiting 03/09/2018   Chronic generalized abdominal pain 03/09/2018   Gastroesophageal reflux disease 03/09/2018   Rectal bleeding 03/09/2018   Hemorrhoids 03/09/2018   Hernia of abdominal cavity 03/06/2011     Medications Ordered Prior to Encounter[1]  Allergies[2]  Social History   Socioeconomic History   Marital status: Married    Spouse name: Not on file   Number of children: Not on file   Years of education: Not on file   Highest education level: Not on file  Occupational History  Occupation: part time housekeeper   Tobacco Use   Smoking status: Some Days    Types: Cigarettes   Smokeless tobacco: Never  Vaping Use   Vaping status: Never Used  Substance and Sexual Activity   Alcohol use: No   Drug use: No   Sexual activity: Yes    Birth control/protection: None     Comment: tubal  Other Topics Concern   Not on file  Social History Narrative   Not on file   Social Drivers of Health   Tobacco Use: High Risk (12/19/2023)   Patient History    Smoking Tobacco Use: Some Days    Smokeless Tobacco Use: Never    Passive Exposure: Not on file  Financial Resource Strain: Low Risk (02/18/2023)   Overall Financial Resource Strain (CARDIA)    Difficulty of Paying Living Expenses: Not hard at all  Food Insecurity: No Food Insecurity (02/18/2023)   Hunger Vital Sign    Worried About Running Out of Food in the Last Year: Never true    Ran Out of Food in the Last Year: Never true  Transportation Needs: No Transportation Needs (02/18/2023)   PRAPARE - Administrator, Civil Service (Medical): No    Lack of Transportation (Non-Medical): No  Physical Activity: Inactive (02/18/2023)   Exercise Vital Sign    Days of Exercise per Week: 0 days    Minutes of Exercise per Session: 0 min  Stress: No Stress Concern Present (02/18/2023)   [employer] of Occupational Health - Occupational Stress Questionnaire    Feeling of Stress : Not at all  Social Connections: Socially Isolated (02/18/2023)   Social Connection and Isolation Panel    Frequency of Communication with Friends and Family: More than three times a week    Frequency of Social Gatherings with Friends and Family: Once a week    Attends Religious Services: Never    Database Administrator or Organizations: No    Attends Banker Meetings: Never    Marital Status: Separated  Intimate Partner Violence: Not At Risk (02/18/2023)   Humiliation, Afraid, Rape, and Kick questionnaire    Fear of Current or Ex-Partner: No    Emotionally Abused: No    Physically Abused: No    Sexually Abused: No  Depression (PHQ2-9): Low Risk (12/19/2023)   Depression (PHQ2-9)    PHQ-2 Score: 4  Alcohol Screen: Low Risk (02/18/2023)   Alcohol Screen    Last Alcohol Screening Score (AUDIT): 2  Housing: Low Risk  (02/18/2023)   Housing    Last Housing Risk Score: 0  Utilities: Not At Risk (02/18/2023)   AHC Utilities    Threatened with loss of utilities: No  Health Literacy: Adequate Health Literacy (02/18/2023)   B1300 Health Literacy    Frequency of need for help with medical instructions: Never    Family History  Problem Relation Age of Onset   Hypertension Mother    Anesthesia problems Neg Hx    Hypotension Neg Hx    Malignant hyperthermia Neg Hx    Pseudochol deficiency Neg Hx    Colon cancer Neg Hx    Esophageal cancer Neg Hx    Inflammatory bowel disease Neg Hx    Liver disease Neg Hx    Pancreatic cancer Neg Hx    Rectal cancer Neg Hx    Stomach cancer Neg Hx    Colon polyps Neg Hx    BRCA 1/2 Neg Hx    Breast cancer Neg  Hx     Past Surgical History:  Procedure Laterality Date   BREAST BIOPSY Right 05/01/2023   MM RT BREAST BX W LOC DEV 1ST LESION IMAGE BX SPEC STEREO GUIDE 05/01/2023 [facility]   BREAST BIOPSY Left 05/01/2023   MM LT BREAST BX W LOC DEV 1ST LESION IMAGE BX SPEC STEREO GUIDE 05/01/2023 [facility]   COLONOSCOPY WITH ESOPHAGOGASTRODUODENOSCOPY (EGD)     DILATION AND CURETTAGE OF UTERUS     EPIGASTRIC HERNIA REPAIR N/A 06/05/2018   Procedure: OPEN REPAIR OF INCARCERATED EPIGASTRIC HERNIA WITH MESH;  Surgeon: Signe Mitzie LABOR, MD;  Location: [facility];  Service: General;  Laterality: N/A;   ESOPHAGEAL MANOMETRY N/A 06/29/2018   Procedure: ESOPHAGEAL MANOMETRY (EM);  Surgeon: Shila Gustav GAILS, MD;  Location: [facility];  Service: Endoscopy;  Laterality: N/A;   TUBAL LIGATION  07/25/2011   Procedure: POST PARTUM TUBAL LIGATION;  Surgeon: Winton Felt, MD;  Location: [facility];  Service: Gynecology;  Laterality: Bilateral;    ROS: Review of Systems Negative except as stated above  PHYSICAL EXAM: BP 116/69 (BP Location: Left Arm, Patient Position: Standing, Cuff Size: Normal)   Pulse 68   Ht 5' 1 (1.549 m)   Wt 113 lb (51.3 kg)   SpO2 99%   BMI  21.35 kg/m   Wt Readings from Last 3 Encounters:  04/19/24 113 lb (51.3 kg)  12/19/23 110 lb (49.9 kg)  12/18/23 108 lb (49 kg)    Physical Exam  General appearance - alert, well appearing, and in no distress Mental status - normal mood, behavior, speech, dress, motor activity, and thought processes Eyes - pupils equal and reactive, extraocular eye movements intact Mouth - mucous membranes moist, pharynx normal without lesions Chest - clear to auscultation, no wheezes, rales or rhonchi, symmetric air entry Heart - normal rate, regular rhythm, normal S1, S2, no murmurs, rubs, clicks or gallops Abdomen: Normal bowel sounds, nondistended, soft. Mild BL lower quadrant tenderness LT>RT Pelvic -CMA Clarisa is present: No external vaginal lesion.  Cervix appears grossly normal.  No cervical motion tenderness.  She has moderate discomfort on palpation of both adnexal areas and of the uterus.  Uterus feels normal size and consistency.     04/19/2024    4:07 PM 12/19/2023   11:19 AM 08/18/2023   11:41 AM  Depression screen PHQ 2/9  Decreased Interest 1 1 2   Down, Depressed, Hopeless 0 0 0  PHQ - 2 Score 1 1 2   Altered sleeping 1 0 0  Tired, decreased energy 1 3 3   Change in appetite 1 0 3  Feeling bad or failure about yourself  0 0 0  Trouble concentrating 0 0 2  Moving slowly or fidgety/restless 1 0 2  Suicidal thoughts 0 0 0  PHQ-9 Score 5 4  12    Difficult doing work/chores Somewhat difficult Somewhat difficult Somewhat difficult     Data saved with a previous flowsheet row definition      04/19/2024    4:07 PM 12/19/2023   11:19 AM 08/18/2023   11:41 AM 02/18/2023    9:33 AM  GAD 7 : Generalized Anxiety Score  Nervous, Anxious, on Edge 1 1 2 1   Control/stop worrying 1 1 3 3   Worry too much - different things 1 3 3 3   Trouble relaxing 1 3 2 1   Restless 0 0 2 0  Easily annoyed or irritable 0 0 3 0  Afraid - awful might happen 0 0 3 0  Total GAD 7 Score 4 8 18 8   Anxiety Difficulty  Somewhat difficult Somewhat difficult Somewhat difficult Not difficult at all     Results for orders placed or performed in visit on 04/19/24  POCT URINALYSIS DIP (CLINITEK)   Collection Time: 04/19/24  3:06 PM  Result Value Ref Range   Color, UA yellow yellow   Clarity, UA clear clear   Glucose, UA negative negative mg/dL   Bilirubin, UA negative negative   Ketones, POC UA negative negative mg/dL   Spec Grav, UA >=8.969 (A) 1.010 - 1.025   Blood, UA moderate (A) negative   pH, UA 6.0 5.0 - 8.0   POC PROTEIN,UA negative negative, trace   Urobilinogen, UA 0.2 0.2 or 1.0 E.U./dL   Nitrite, UA Negative Negative   Leukocytes, UA Negative Negative  POCT urine pregnancy   Collection Time: 04/19/24  3:07 PM  Result Value Ref Range   Preg Test, Ur Negative Negative        Latest Ref Rng & Units 12/08/2023    1:00 AM 12/08/2023   12:52 AM 05/17/2021   11:37 AM  CMP  Glucose 70 - 99 mg/dL 899  898  80   BUN 6 - 20 mg/dL 10  10  8    Creatinine 0.44 - 1.00 mg/dL 9.09  9.31  9.27   Sodium 135 - 145 mmol/L 143  140  140   Potassium 3.5 - 5.1 mmol/L 3.5  3.5  4.0   Chloride 98 - 111 mmol/L 108  109  102   CO2 22 - 32 mmol/L  18  22   Calcium  8.9 - 10.3 mg/dL  9.1  9.7   Total Protein 6.5 - 8.1 g/dL  7.0  7.5   Total Bilirubin 0.0 - 1.2 mg/dL  0.7  0.6   Alkaline Phos 38 - 126 U/L  60  72   AST 15 - 41 U/L  24  17   ALT 0 - 44 U/L  18  14    Lipid Panel  No results found for: CHOL, TRIG, HDL, CHOLHDL, VLDL, LDLCALC, LDLDIRECT  CBC    Component Value Date/Time   WBC 11.6 (H) 12/08/2023 0052   RBC 4.16 12/08/2023 0052   HGB 13.6 12/08/2023 0100   HGB 13.0 05/17/2021 1137   HCT 40.0 12/08/2023 0100   HCT 40.2 05/17/2021 1137   PLT 247 12/08/2023 0052   PLT 261 05/17/2021 1137   MCV 92.8 12/08/2023 0052   MCV 93 05/17/2021 1137   MCH 31.0 12/08/2023 0052   MCHC 33.4 12/08/2023 0052   RDW 15.0 12/08/2023 0052   RDW 14.0 05/17/2021 1137   LYMPHSABS 2.6  05/17/2021 1137   MONOABS 0.6 06/03/2018 1417   EOSABS 0.1 05/17/2021 1137   BASOSABS 0.0 05/17/2021 1137    ASSESSMENT AND PLAN: 1. Lower abdominal pain (Primary) Seems to be more pelvic in etiology. Vaginal cultures sent. We will get pelvic ultrasound If any worsening of symptoms, patient told to be seen in the emergency room. - POCT URINALYSIS DIP (CLINITEK) - POCT urine pregnancy - US  Pelvic Complete With Transvaginal; Future - Urinalysis, Routine w reflex microscopic  2. Excessive bleeding in premenopausal period This may be due to her being perimenopausal.  However we will check pelvic ultrasound especially since she is also having pain in the left lower quadrant with sexual intercourse. - CBC - US  Pelvic Complete With Transvaginal; Future  3. Pap smear for cervical cancer screening - Cytology - PAP -  Cervicovaginal ancillary only  4. Major depressive disorder, recurrent episode, mild Patient with mild depression base on PHQ-9 done in office today.  She has been out of Paxil  for about 2 months and feels that she needs to be back on it.  Will restart at the lower dose of 10 mg.  Continue counseling sessions with LCSW - PARoxetine  (PAXIL ) 10 MG tablet; Take 1 tablet (10 mg total) by mouth daily. Dose increase  Dispense: 30 tablet; Refill: 5  5. Anxiety See #4 above  6. Dizziness Patient reports a dizziness that started during heavy menses.  She has pink conjunctiva today but we will still check a CBC. - CBC  7. Need for immunization against influenza - Flu vaccine trivalent PF, 6mos and older(Flulaval,Afluria,Fluarix,Fluzone)  8. Microscopic hematuria UA is negative for UTI.  However urine dip showed moderate blood.  Will send it out for urinalysis.   - Urinalysis, Routine w reflex microscopic    Patient was given the opportunity to ask questions.  Patient verbalized understanding of the plan and was able to repeat key elements of the plan.   This documentation was  completed using Paediatric nurse.  Any transcriptional errors are unintentional.  Orders Placed This Encounter  Procedures   Flu vaccine trivalent PF, 6mos and older(Flulaval,Afluria,Fluarix,Fluzone)     Requested Prescriptions    No prescriptions requested or ordered in this encounter    No follow-ups on file.  Barnie Louder, MD, FACP     [1]  Current Outpatient Medications on File Prior to Visit  Medication Sig Dispense Refill   cyclobenzaprine  (FLEXERIL ) 5 MG tablet Take 1 tablet (5 mg total) by mouth daily as needed for muscle spasms. (Patient not taking: Reported on 04/19/2024) 30 tablet 1   ibuprofen  (ADVIL ) 600 MG tablet Take 1 tablet (600 mg total) by mouth every 8 (eight) hours as needed for moderate pain (pain score 4-6). Take with food (Patient not taking: Reported on 04/19/2024) 60 tablet 0   nicotine  (NICODERM CQ  - DOSED IN MG/24 HOURS) 14 mg/24hr patch Place 1 patch (14 mg total) onto the skin daily. (Patient not taking: Reported on 04/19/2024) 28 patch 1   nicotine  polacrilex (NICORETTE ) 2 MG gum Take 1 each (2 mg total) by mouth as needed for smoking cessation. (Patient not taking: Reported on 04/19/2024) 100 tablet 0   omeprazole  (PRILOSEC) 20 MG capsule Take 1 capsule (20 mg total) by mouth daily. (Patient not taking: Reported on 04/19/2024) 30 capsule 3   PARoxetine  (PAXIL ) 20 MG tablet Take 1 tablet (20 mg total) by mouth daily. Dose increase (Patient not taking: Reported on 04/19/2024) 30 tablet 2   No current facility-administered medications on file prior to visit.  [2]  Allergies Allergen Reactions   Penicillins Swelling    Did it involve swelling of the face/tongue/throat, SOB, or low BP? Yes Did it involve sudden or severe rash/hives, skin peeling, or any reaction on the inside of your mouth or nose? No Did you need to seek medical attention at a hospital or doctor's office? No When did it last happen?  1 or 2 years ago     If all above answers  are NO, may proceed with cephalosporin use.    Vicodin [Hydrocodone -Acetaminophen ] Hives and Itching   Morphine  And Codeine Rash    Nervous,itching   "

## 2024-04-20 ENCOUNTER — Ambulatory Visit: Payer: Self-pay | Admitting: Internal Medicine

## 2024-04-20 DIAGNOSIS — R3129 Other microscopic hematuria: Secondary | ICD-10-CM

## 2024-04-20 LAB — CBC
Hematocrit: 39 % (ref 34.0–46.6)
Hemoglobin: 12.7 g/dL (ref 11.1–15.9)
MCH: 30 pg (ref 26.6–33.0)
MCHC: 32.6 g/dL (ref 31.5–35.7)
MCV: 92 fL (ref 79–97)
Platelets: 296 x10E3/uL (ref 150–450)
RBC: 4.23 x10E6/uL (ref 3.77–5.28)
RDW: 14.6 % (ref 11.7–15.4)
WBC: 7.5 x10E3/uL (ref 3.4–10.8)

## 2024-04-20 LAB — MICROSCOPIC EXAMINATION
Bacteria, UA: NONE SEEN
Casts: NONE SEEN /LPF
WBC, UA: NONE SEEN /HPF (ref 0–5)

## 2024-04-20 LAB — URINALYSIS, ROUTINE W REFLEX MICROSCOPIC
Bilirubin, UA: NEGATIVE
Glucose, UA: NEGATIVE
Leukocytes,UA: NEGATIVE
Nitrite, UA: NEGATIVE
Protein,UA: NEGATIVE
Specific Gravity, UA: 1.022 (ref 1.005–1.030)
Urobilinogen, Ur: 0.2 mg/dL (ref 0.2–1.0)
pH, UA: 6 (ref 5.0–7.5)

## 2024-04-21 LAB — CERVICOVAGINAL ANCILLARY ONLY
Bacterial Vaginitis (gardnerella): POSITIVE — AB
Candida Glabrata: NEGATIVE
Candida Vaginitis: NEGATIVE
Chlamydia: POSITIVE — AB
Comment: NEGATIVE
Comment: NEGATIVE
Comment: NEGATIVE
Comment: NEGATIVE
Comment: NEGATIVE
Comment: NORMAL
Neisseria Gonorrhea: NEGATIVE
Trichomonas: NEGATIVE

## 2024-04-21 MED ORDER — METRONIDAZOLE 500 MG PO TABS: 500.0000 mg | ORAL_TABLET | Freq: Two times a day (BID) | ORAL | 0 refills | Status: DC

## 2024-04-21 MED ORDER — DOXYCYCLINE HYCLATE 100 MG PO TABS: 100.0000 mg | ORAL_TABLET | Freq: Two times a day (BID) | ORAL | 0 refills | Status: DC

## 2024-04-23 LAB — CYTOLOGY - PAP
Adequacy: ABSENT
Comment: NEGATIVE
Comment: NEGATIVE
Comment: NEGATIVE
Diagnosis: NEGATIVE
HPV 16: NEGATIVE
HPV 18 / 45: NEGATIVE
High risk HPV: POSITIVE — AB

## 2024-04-26 ENCOUNTER — Ambulatory Visit (INDEPENDENT_AMBULATORY_CARE_PROVIDER_SITE_OTHER): Admitting: Clinical

## 2024-04-26 DIAGNOSIS — F331 Major depressive disorder, recurrent, moderate: Secondary | ICD-10-CM

## 2024-04-26 NOTE — Progress Notes (Signed)
" ° °  THERAPIST PROGRESS NOTE  Session Time: 30 min  Participation Level: Active  Behavioral Response: CasualAlertDepressed  Type of Therapy: Individual Therapy  Treatment Goals addressed: client will engage in at least 80% of scheduled individual psychotherapy sessions  ProgressTowards Goals: Progressing  Interventions: CBT  Summary:  Bonnie Conrad is a 54 y.o. female who presents for the scheduled appointment oriented times five, appropriately dressed and friendly. Client denied hallucinations and delusions. Client presented with a in person spanish interpreter.  Client reported she has been feeling a mix of emotions. Client reported she got her days mixed up trying to get all her doctor appointments situated. Client reported things at work have been fine. Client reported her 22 y.o daughter is still having the same behaviors. Client reported she has thought about speaking to her father but there is some discomfort with his new wife who acts cold towards her. Client reported however when her daughter is with her dad she has to abide by his rules because he is strict but when she comes back home to her she is rude. Client reported the house has discord and does not peaceful. Client was tearful describing how she feels about it. Evidence of progress towards goal:  client reported 1 positive of brainstorming what to say to her family about needing help with her daughter.  Suicidal/Homicidal: Nowithout intent/plan  Therapist Response:  Therapist began the appointment asking the client how she has been doing. Therapist engaged with active listening and positive emotional support. Therapist used cbt to engage and ask the client to describe how she is currently feeling with stress. Therapist used cbt to positively motivate her to take assertive action and communicate with family involved to get help. Therapist used CBT ask the client to identify her progress with frequency of use  with coping skills with continued practice in her daily activity.    Therapist assigned her homework to practice self care.  Plan: Return again in 3 weeks.  Diagnosis: mdd, recurrent episode, moderate  Collaboration of Care: Patient refused AEB none requested by the client.  Patient/Guardian was advised Release of Information must be obtained prior to any record release in order to collaborate their care with an outside provider. Patient/Guardian was advised if they have not already done so to contact the registration department to sign all necessary forms in order for us  to release information regarding their care.   Consent: Patient/Guardian gives verbal consent for treatment and assignment of benefits for services provided during this visit. Patient/Guardian expressed understanding and agreed to proceed.   Raman Featherston Y Issabella Rix, LCSW 04/26/2024  "

## 2024-04-27 ENCOUNTER — Ambulatory Visit
Admission: RE | Admit: 2024-04-27 | Discharge: 2024-04-27 | Disposition: A | Source: Ambulatory Visit | Attending: Internal Medicine | Admitting: Internal Medicine

## 2024-04-27 DIAGNOSIS — N924 Excessive bleeding in the premenopausal period: Secondary | ICD-10-CM

## 2024-04-27 DIAGNOSIS — R1030 Lower abdominal pain, unspecified: Secondary | ICD-10-CM

## 2024-05-05 ENCOUNTER — Ambulatory Visit: Payer: Self-pay | Attending: Internal Medicine

## 2024-05-05 DIAGNOSIS — R3129 Other microscopic hematuria: Secondary | ICD-10-CM

## 2024-05-06 ENCOUNTER — Ambulatory Visit: Payer: Self-pay | Admitting: Internal Medicine

## 2024-05-06 LAB — URINALYSIS, ROUTINE W REFLEX MICROSCOPIC
Bilirubin, UA: NEGATIVE
Glucose, UA: NEGATIVE
Ketones, UA: NEGATIVE
Nitrite, UA: NEGATIVE
Specific Gravity, UA: 1.024 (ref 1.005–1.030)
Urobilinogen, Ur: 0.2 mg/dL (ref 0.2–1.0)
pH, UA: 6 (ref 5.0–7.5)

## 2024-05-06 LAB — MICROSCOPIC EXAMINATION
Casts: NONE SEEN /LPF
Epithelial Cells (non renal): >10 /HPF — AB (ref 0–10)

## 2024-05-07 ENCOUNTER — Other Ambulatory Visit: Payer: Self-pay | Admitting: Internal Medicine

## 2024-05-07 MED ORDER — FLUCONAZOLE 150 MG PO TABS: 150.0000 mg | ORAL_TABLET | Freq: Every day | ORAL | 0 refills | Status: AC

## 2024-05-07 MED ORDER — SULFAMETHOXAZOLE-TRIMETHOPRIM 800-160 MG PO TABS: 1.0000 | ORAL_TABLET | Freq: Two times a day (BID) | ORAL | 0 refills | Status: AC

## 2024-05-10 ENCOUNTER — Ambulatory Visit (HOSPITAL_COMMUNITY): Admitting: Clinical

## 2024-05-14 ENCOUNTER — Encounter (HOSPITAL_COMMUNITY): Payer: Self-pay

## 2024-05-17 ENCOUNTER — Encounter (HOSPITAL_COMMUNITY): Payer: Self-pay

## 2024-05-17 ENCOUNTER — Ambulatory Visit (HOSPITAL_COMMUNITY): Admitting: Clinical

## 2024-08-20 ENCOUNTER — Ambulatory Visit: Payer: Self-pay | Admitting: Internal Medicine
